# Patient Record
Sex: Male | Born: 1965 | Race: White | Hispanic: No | Marital: Married
Health system: Southern US, Community
[De-identification: ages and names within clinical notes are randomized; demographics above are authoritative.]

## PROBLEM LIST (undated history)

## (undated) DIAGNOSIS — J9621 Acute and chronic respiratory failure with hypoxia: Secondary | ICD-10-CM

## (undated) DIAGNOSIS — N170 Acute kidney failure with tubular necrosis: Secondary | ICD-10-CM

## (undated) DIAGNOSIS — U071 COVID-19: Secondary | ICD-10-CM

## (undated) DIAGNOSIS — J1282 Pneumonia due to coronavirus disease 2019: Secondary | ICD-10-CM

## (undated) DIAGNOSIS — J80 Acute respiratory distress syndrome: Secondary | ICD-10-CM

## (undated) HISTORY — DX: Acute and chronic respiratory failure with hypoxia: J96.21

## (undated) HISTORY — DX: Acute kidney failure with tubular necrosis: N17.0

## (undated) HISTORY — DX: COVID-19: U07.1

## (undated) HISTORY — DX: Pneumonia due to coronavirus disease 2019: J12.82

## (undated) HISTORY — DX: Acute respiratory distress syndrome: J80

---

## 2019-11-28 ENCOUNTER — Other Ambulatory Visit (HOSPITAL_COMMUNITY): Payer: BLUE CROSS/BLUE SHIELD

## 2019-11-28 ENCOUNTER — Inpatient Hospital Stay
Admission: AD | Admit: 2019-11-28 | Discharge: 2020-02-17 | Disposition: E | Payer: BLUE CROSS/BLUE SHIELD | Source: Other Acute Inpatient Hospital

## 2019-11-28 DIAGNOSIS — U071 COVID-19: Secondary | ICD-10-CM | POA: Diagnosis present

## 2019-11-28 DIAGNOSIS — Z4902 Encounter for fitting and adjustment of peritoneal dialysis catheter: Secondary | ICD-10-CM

## 2019-11-28 DIAGNOSIS — J9621 Acute and chronic respiratory failure with hypoxia: Secondary | ICD-10-CM | POA: Diagnosis present

## 2019-11-28 DIAGNOSIS — J189 Pneumonia, unspecified organism: Secondary | ICD-10-CM

## 2019-11-28 DIAGNOSIS — R0902 Hypoxemia: Secondary | ICD-10-CM

## 2019-11-28 DIAGNOSIS — J811 Chronic pulmonary edema: Secondary | ICD-10-CM

## 2019-11-28 DIAGNOSIS — R509 Fever, unspecified: Secondary | ICD-10-CM

## 2019-11-28 DIAGNOSIS — Z431 Encounter for attention to gastrostomy: Secondary | ICD-10-CM

## 2019-11-28 DIAGNOSIS — Z452 Encounter for adjustment and management of vascular access device: Secondary | ICD-10-CM

## 2019-11-28 DIAGNOSIS — N170 Acute kidney failure with tubular necrosis: Secondary | ICD-10-CM | POA: Diagnosis present

## 2019-11-28 DIAGNOSIS — J9690 Respiratory failure, unspecified, unspecified whether with hypoxia or hypercapnia: Secondary | ICD-10-CM

## 2019-11-28 HISTORY — DX: Acute respiratory distress syndrome: J80

## 2019-11-28 HISTORY — DX: COVID-19: U07.1

## 2019-11-28 HISTORY — DX: Pneumonia due to coronavirus disease 2019: J12.82

## 2019-11-28 HISTORY — DX: Acute and chronic respiratory failure with hypoxia: J96.21

## 2019-11-28 HISTORY — DX: Acute kidney failure with tubular necrosis: N17.0

## 2019-11-28 LAB — BLOOD GAS, ARTERIAL
Acid-Base Excess: 8.2 mmol/L — ABNORMAL HIGH (ref 0.0–2.0)
Acid-Base Excess: 8.3 mmol/L — ABNORMAL HIGH (ref 0.0–2.0)
Bicarbonate: 36.4 mmol/L — ABNORMAL HIGH (ref 20.0–28.0)
Bicarbonate: 36.6 mmol/L — ABNORMAL HIGH (ref 20.0–28.0)
FIO2: 55
FIO2: 55
O2 Saturation: 96.2 %
O2 Saturation: 97.6 %
Patient temperature: 36.5
Patient temperature: 38.1
pCO2 arterial: 98.2 mmHg (ref 32.0–48.0)
pCO2 arterial: 108 mmHg (ref 32.0–48.0)
pH, Arterial: 7.19 — CL (ref 7.350–7.450)
pH, Arterial: 7.164 — CL (ref 7.350–7.450)
pO2, Arterial: 78.2 mmHg — ABNORMAL LOW (ref 83.0–108.0)
pO2, Arterial: 100 mmHg (ref 83.0–108.0)

## 2019-11-28 MED ORDER — IOHEXOL 300 MG/ML  SOLN
50.0000 mL | Freq: Once | INTRAMUSCULAR | Status: AC | PRN
  Administered 2019-11-28: 50 mL

## 2019-11-29 ENCOUNTER — Other Ambulatory Visit (HOSPITAL_COMMUNITY): Payer: BLUE CROSS/BLUE SHIELD

## 2019-11-29 DIAGNOSIS — U071 COVID-19: Secondary | ICD-10-CM | POA: Diagnosis present

## 2019-11-29 DIAGNOSIS — J80 Acute respiratory distress syndrome: Secondary | ICD-10-CM | POA: Diagnosis not present

## 2019-11-29 DIAGNOSIS — J1282 Pneumonia due to coronavirus disease 2019: Secondary | ICD-10-CM | POA: Diagnosis present

## 2019-11-29 DIAGNOSIS — J9621 Acute and chronic respiratory failure with hypoxia: Secondary | ICD-10-CM | POA: Diagnosis not present

## 2019-11-29 DIAGNOSIS — N170 Acute kidney failure with tubular necrosis: Secondary | ICD-10-CM | POA: Diagnosis not present

## 2019-11-29 LAB — BLOOD GAS, ARTERIAL
Acid-Base Excess: 13 mmol/L — ABNORMAL HIGH (ref 0.0–2.0)
Acid-Base Excess: 13.5 mmol/L — ABNORMAL HIGH (ref 0.0–2.0)
Acid-Base Excess: 12.1 mmol/L — ABNORMAL HIGH (ref 0.0–2.0)
Acid-Base Excess: 11.1 mmol/L — ABNORMAL HIGH (ref 0.0–2.0)
Bicarbonate: 40.3 mmol/L — ABNORMAL HIGH (ref 20.0–28.0)
Bicarbonate: 40.7 mmol/L — ABNORMAL HIGH (ref 20.0–28.0)
Bicarbonate: 39.4 mmol/L — ABNORMAL HIGH (ref 20.0–28.0)
Bicarbonate: 39.5 mmol/L — ABNORMAL HIGH (ref 20.0–28.0)
FIO2: 50
FIO2: 50
FIO2: 50
FIO2: 50
O2 Saturation: 98.2 %
O2 Saturation: 96.7 %
O2 Saturation: 95.8 %
O2 Saturation: 96.1 %
Patient temperature: 37.8
Patient temperature: 37.2
Patient temperature: 37.2
Patient temperature: 36.3
pCO2 arterial: 97.2 mmHg (ref 32.0–48.0)
pCO2 arterial: 92.2 mmHg (ref 32.0–48.0)
pCO2 arterial: 91.3 mmHg (ref 32.0–48.0)
pCO2 arterial: 108 mmHg (ref 32.0–48.0)
pH, Arterial: 7.247 — ABNORMAL LOW (ref 7.350–7.450)
pH, Arterial: 7.268 — ABNORMAL LOW (ref 7.350–7.450)
pH, Arterial: 7.259 — ABNORMAL LOW (ref 7.350–7.450)
pH, Arterial: 7.185 — CL (ref 7.350–7.450)
pO2, Arterial: 101 mmHg (ref 83.0–108.0)
pO2, Arterial: 84 mmHg (ref 83.0–108.0)
pO2, Arterial: 77.4 mmHg — ABNORMAL LOW (ref 83.0–108.0)
pO2, Arterial: 77.7 mmHg — ABNORMAL LOW (ref 83.0–108.0)

## 2019-11-29 LAB — CBC WITH DIFFERENTIAL/PLATELET
Abs Immature Granulocytes: 1.18 10*3/uL — ABNORMAL HIGH (ref 0.00–0.07)
Basophils Absolute: 0.1 10*3/uL (ref 0.0–0.1)
Basophils Relative: 0 %
Eosinophils Absolute: 0 10*3/uL (ref 0.0–0.5)
Eosinophils Relative: 0 %
HCT: 32.1 % — ABNORMAL LOW (ref 39.0–52.0)
Hemoglobin: 9.5 g/dL — ABNORMAL LOW (ref 13.0–17.0)
Immature Granulocytes: 6 %
Lymphocytes Relative: 7 %
Lymphs Abs: 1.3 10*3/uL (ref 0.7–4.0)
MCH: 30.3 pg (ref 26.0–34.0)
MCHC: 29.6 g/dL — ABNORMAL LOW (ref 30.0–36.0)
MCV: 102.2 fL — ABNORMAL HIGH (ref 80.0–100.0)
Monocytes Absolute: 0.9 10*3/uL (ref 0.1–1.0)
Monocytes Relative: 5 %
Neutro Abs: 16.4 10*3/uL — ABNORMAL HIGH (ref 1.7–7.7)
Neutrophils Relative %: 82 %
Platelets: 203 10*3/uL (ref 150–400)
RBC: 3.14 MIL/uL — ABNORMAL LOW (ref 4.22–5.81)
RDW: 15.9 % — ABNORMAL HIGH (ref 11.5–15.5)
WBC: 19.9 10*3/uL — ABNORMAL HIGH (ref 4.0–10.5)
nRBC: 0.3 % — ABNORMAL HIGH (ref 0.0–0.2)

## 2019-11-29 LAB — COMPREHENSIVE METABOLIC PANEL
ALT: 25 U/L (ref 0–44)
AST: 34 U/L (ref 15–41)
Albumin: 2.1 g/dL — ABNORMAL LOW (ref 3.5–5.0)
Alkaline Phosphatase: 78 U/L (ref 38–126)
Anion gap: 8 (ref 5–15)
BUN: 123 mg/dL — ABNORMAL HIGH (ref 6–20)
CO2: 39 mmol/L — ABNORMAL HIGH (ref 22–32)
Calcium: 8.8 mg/dL — ABNORMAL LOW (ref 8.9–10.3)
Chloride: 106 mmol/L (ref 98–111)
Creatinine, Ser: 1.75 mg/dL — ABNORMAL HIGH (ref 0.61–1.24)
GFR calc Af Amer: 49 mL/min — ABNORMAL LOW (ref >60)
GFR calc non Af Amer: 43 mL/min — ABNORMAL LOW (ref >60)
Glucose, Bld: 161 mg/dL — ABNORMAL HIGH (ref 70–99)
Potassium: 5 mmol/L (ref 3.5–5.1)
Sodium: 153 mmol/L — ABNORMAL HIGH (ref 135–145)
Total Bilirubin: 0.4 mg/dL (ref 0.3–1.2)
Total Protein: 7 g/dL (ref 6.5–8.1)

## 2019-11-29 LAB — TRIGLYCERIDES: Triglycerides: 371 mg/dL — ABNORMAL HIGH (ref <150)

## 2019-11-29 LAB — PROTIME-INR
INR: 1.1 (ref 0.8–1.2)
Prothrombin Time: 14.1 seconds (ref 11.4–15.2)

## 2019-11-29 LAB — HEMOGLOBIN A1C
Hgb A1c MFr Bld: 5.7 % — ABNORMAL HIGH (ref 4.8–5.6)
Mean Plasma Glucose: 116.89 mg/dL

## 2019-11-29 NOTE — Consult Note (Signed)
Pulmonary Critical Care Medicine [facility] [place]  PULMONARY SERVICE  Date of Service: 11/29/2019  PULMONARY CRITICAL CARE CONSULT   Danny Leach  OIN:[id number]  DOB: June 12, 1965   DOA: 12/17/2019  Referring Physician: Carron Curie, MD  HPI: Danny Leach is a 54 y.o. male seen for follow up of Acute on Chronic Respiratory Failure.  Patient has multiple medical problems including hyperlipidemia COVID-19 history of smoking respiratory failure hypertension came into the hospital because of increasing shortness of breath fever fatigue diarrhea and was tested for COVID-19 turned out to be positive.  Patient followed up in the ED because of increased work of breathing.  Patient was given Decadron started on OptiFlow at that time.  CTA was done negative for pulmonary emboli however he had severe multifocal pneumonia.  The patient ended up intubated on the ventilator failed OptiFlow.  The patient had required ongoing sedation.  He also did develop renal failure subsequently improved.  Patient's family wanted ongoing aggressive measures tracheostomy and PEG tube was placed because of failure to wean from the ventilator.  The patient apparently had been on pressure support at the other facility reportedly according to the discharge summary.  On presentation to our facility he was significantly acidotic with a pH of 7.16 on arrival to our facility.  Review of Systems:  ROS performed and is unremarkable other than noted above.  Past Medical History:  Diagnosis Date  Unspecified essential hypertension  Hyperlipidemia ARDS COVID-19 COVID-19 pneumonia Respiratory failure Possible undiagnosed COPD  Past Surgical History:  Procedure Laterality Date  DENTAL SURGERY  TONSILLECTOMY Tracheostomy PEG tube  Family history: Noncontributory to the present illness  Allergies No known drug allergies  Social history: Positive for tobacco use No alcohol or drug  abuse   Medications: Reviewed on Rounds  Physical Exam:  Vitals: Temperature is 98.3 pulse 103 respiratory 28 blood pressure is 135/76 saturations 96%  Ventilator Settings changed the patient from assist control to on pressure assist control FiO2 50% tidal volume is 459 IP 18 PEEP 10 and we will also decrease the patient PEEP and increase the patient's respiratory rate and follow-up with an ABG  . General: Comfortable at this time . Eyes: Grossly normal lids, irises & conjunctiva . ENT: grossly tongue is normal . Neck: no obvious mass . Cardiovascular: S1-S2 normal no gallop or rub . Respiratory: Coarse breath sounds noted bilaterally . Abdomen: Soft and nontender . Skin: no rash seen on limited exam . Musculoskeletal: not rigid . Psychiatric:unable to assess . Neurologic: no seizure no involuntary movements         Labs on Admission:  Basic Metabolic Panel: Recent Labs  Lab 11/29/19 0558  NA 153*  K 5.0  CL 106  CO2 39*  GLUCOSE 161*  BUN 123*  CREATININE 1.75*  CALCIUM 8.8*    Recent Labs  Lab 11/21/2019 1732 12/09/2019 2150 11/29/19 0045 11/29/19 0825  PHART 7.190* 7.164* 7.247* 7.268*  PCO2ART 98.2* 108* 97.2* 92.2*  PO2ART 78.2* 100 101 84.0  HCO3 36.4* 36.6* 40.3* 40.7*  O2SAT 96.2 97.6 98.2 96.7    Liver Function Tests: Recent Labs  Lab 11/29/19 0558  AST 34  ALT 25  ALKPHOS 78  BILITOT 0.4  PROT 7.0  ALBUMIN 2.1*   No results for input(s): LIPASE, AMYLASE in the last 168 hours. No results for input(s): AMMONIA in the last 168 hours.  CBC: Recent Labs  Lab 11/29/19 0558  WBC 19.9*  NEUTROABS 16.4*  HGB 9.5*  HCT 32.1*  MCV 102.2*  PLT 203    Cardiac Enzymes: No results for input(s): CKTOTAL, CKMB, CKMBINDEX, TROPONINI in the last 168 hours.  BNP (last 3 results) No results for input(s): BNP in the last 8760 hours.  ProBNP (last 3 results) No results for input(s): PROBNP in the last 8760 hours.   Radiological Exams on  Admission: DG ABDOMEN PEG TUBE LOCATION  Result Date: 07-Dec-2019 CLINICAL DATA:  Peg placement. EXAM: ABDOMEN - 1 VIEW COMPARISON:  None. FINDINGS: Divided AP view of the abdomen obtained after the installation of 50 cc Omnipaque 300 through indwelling gastrostomy tube. Contrast opacifies the duodenum and jejunum. There is no evidence of extravasation or leak. Multiple overlying monitoring devices project over the patient. Normal bowel gas pattern without evidence of obstruction. Rounded calcification projects over the mid pelvis and right lower quadrant. IMPRESSION: Enteric contrast opacifies the duodenum and jejunum without evidence of extravasation or leak. Tube may represent a jejunostomy tube, recommend correlation with procedural/surgical history. Electronically Signed   By: Narda Rutherford M.D.   On: 2019/12/07 17:27   DG Chest Port 1 View  Result Date: 2019/12/07 CLINICAL DATA:  Pneumonia. EXAM: PORTABLE CHEST 1 VIEW COMPARISON:  None. FINDINGS: 1611 hours. Tracheostomy appears well position. The heart size and mediastinal contours are within normal limits for portable AP technique. There are diffuse bilateral airspace and interstitial pulmonary opacities, left greater than right. No large pleural effusion or pneumothorax. The bones appear unremarkable. IMPRESSION: 1. Diffuse bilateral airspace and interstitial pulmonary opacities, left greater than right, consistent with multilobar pneumonia and/or edema. Radiographic follow up recommended. 2. Tracheostomy appears well position. Electronically Signed   By: Carey Bullocks M.D.   On: December 07, 2019 16:39    Assessment/Plan Active Problems:   Acute on chronic respiratory failure with hypoxia (HCC)   COVID-19 virus infection   Acute respiratory distress syndrome (ARDS) due to COVID-19 virus (HCC)   Pneumonia due to COVID-19 virus   Acute renal failure due to tubular necrosis (HCC)   1. Acute on chronic respiratory failure with hypoxia patient's  not adequately ventilating at this time with a PCO2 being elevated.  Chest x-ray shows diffuse multilobar involvement as a residual COVID-19.  Patient a very well require sedation have spoken with the primary care team to go ahead and actually start sedation until we can get the ventilator settings stabilized.  There may also be a component of pulmonary edema so he may benefit from diuresis however he has worsening of renal function. 2. COVID-19 virus infection this is in resolution phase however patient is still with severe pulmonary damage. 3. ARDS secondary to COVID-19 patient has severe residual pulmonary damage secondary to the COVID-19 suspect this is going to be prolonged recovery and requiring prolonged mechanical ventilation. 4. Pneumonia with underlying COVID-19 has been treated with antibiotics plan is going to be continued supportive care 5. Acute renal failure reportedly improved with the current renal function actually shows at baseline 1.75 creatinine and BUN of 123 he may need to get nephrology involved.  I have personally seen and evaluated the patient, evaluated laboratory and imaging results, formulated the assessment and plan and placed orders. The Patient requires high complexity decision making with multiple systems involvement.  Case was discussed on Rounds with the Respiratory Therapy Director and the Respiratory staff Time Spent patient is critically ill in danger of cardiac arrest and death  Yevonne Pax, MD [facility] Pulmonary Critical Care Medicine Sleep Medicine

## 2019-11-30 ENCOUNTER — Institutional Professional Consult (permissible substitution) (HOSPITAL_COMMUNITY): Payer: BLUE CROSS/BLUE SHIELD

## 2019-11-30 DIAGNOSIS — J9621 Acute and chronic respiratory failure with hypoxia: Secondary | ICD-10-CM | POA: Diagnosis not present

## 2019-11-30 DIAGNOSIS — U071 COVID-19: Secondary | ICD-10-CM | POA: Diagnosis not present

## 2019-11-30 DIAGNOSIS — N170 Acute kidney failure with tubular necrosis: Secondary | ICD-10-CM | POA: Diagnosis not present

## 2019-11-30 DIAGNOSIS — J80 Acute respiratory distress syndrome: Secondary | ICD-10-CM | POA: Diagnosis not present

## 2019-11-30 LAB — BASIC METABOLIC PANEL
Anion gap: 7 (ref 5–15)
BUN: 136 mg/dL — ABNORMAL HIGH (ref 6–20)
CO2: 37 mmol/L — ABNORMAL HIGH (ref 22–32)
Calcium: 8.6 mg/dL — ABNORMAL LOW (ref 8.9–10.3)
Chloride: 106 mmol/L (ref 98–111)
Creatinine, Ser: 1.67 mg/dL — ABNORMAL HIGH (ref 0.61–1.24)
GFR calc Af Amer: 52 mL/min — ABNORMAL LOW (ref >60)
GFR calc non Af Amer: 45 mL/min — ABNORMAL LOW (ref >60)
Glucose, Bld: 186 mg/dL — ABNORMAL HIGH (ref 70–99)
Potassium: 4.8 mmol/L (ref 3.5–5.1)
Sodium: 150 mmol/L — ABNORMAL HIGH (ref 135–145)

## 2019-11-30 LAB — BLOOD GAS, ARTERIAL
Acid-Base Excess: 11 mmol/L — ABNORMAL HIGH (ref 0.0–2.0)
Bicarbonate: 37.8 mmol/L — ABNORMAL HIGH (ref 20.0–28.0)
FIO2: 50
O2 Saturation: 95.2 %
Patient temperature: 37
pCO2 arterial: 83.8 mmHg (ref 32.0–48.0)
pH, Arterial: 7.277 — ABNORMAL LOW (ref 7.350–7.450)
pO2, Arterial: 70.7 mmHg — ABNORMAL LOW (ref 83.0–108.0)

## 2019-11-30 LAB — CBC
HCT: 27.6 % — ABNORMAL LOW (ref 39.0–52.0)
Hemoglobin: 8 g/dL — ABNORMAL LOW (ref 13.0–17.0)
MCH: 29.3 pg (ref 26.0–34.0)
MCHC: 29 g/dL — ABNORMAL LOW (ref 30.0–36.0)
MCV: 101.1 fL — ABNORMAL HIGH (ref 80.0–100.0)
Platelets: 173 10*3/uL (ref 150–400)
RBC: 2.73 MIL/uL — ABNORMAL LOW (ref 4.22–5.81)
RDW: 16.1 % — ABNORMAL HIGH (ref 11.5–15.5)
WBC: 12.5 10*3/uL — ABNORMAL HIGH (ref 4.0–10.5)
nRBC: 0.5 % — ABNORMAL HIGH (ref 0.0–0.2)

## 2019-11-30 LAB — MAGNESIUM: Magnesium: 2.7 mg/dL — ABNORMAL HIGH (ref 1.7–2.4)

## 2019-11-30 NOTE — Progress Notes (Addendum)
Pulmonary Critical Care Medicine [facility] [place]   PULMONARY CRITICAL CARE SERVICE  PROGRESS NOTE  Date of Service: 11/30/2019  Danny Leach  PFX:[id number]  DOB: Jul 06, 1965   DOA: 2019-12-26  Referring Physician: Carron Curie, MD  HPI: Danny Leach is a 54 y.o. male seen for follow up of Acute on Chronic Respiratory Failure.  Patient remains on full support at this time pressure control mode rate of 35 and FiO2 of 60% satting well no fever or distress.  Patient continues to do poorly requiring increased respiratory rate as well as increased time oxygen.  He is now sedated also  Medications: Reviewed on Rounds  Physical Exam:  Vitals: Pulse 91 respirations 36 BP 103/58 O2 sat 97% temp 24  Ventilator Settings ventilator mode AC PC rate of 35 respiratory pressure 24 PEEP of 8 with FiO2 of 60%  . General: Comfortable at this time . Eyes: Grossly normal lids, irises & conjunctiva . ENT: grossly tongue is normal . Neck: no obvious mass . Cardiovascular: S1 S2 normal no gallop . Respiratory: No rales or rhonchi noted . Abdomen: soft . Skin: no rash seen on limited exam . Musculoskeletal: not rigid . Psychiatric:unable to assess . Neurologic: no seizure no involuntary movements         Lab Data:   Basic Metabolic Panel: Recent Labs  Lab 11/29/19 0558 11/30/19 0709  NA 153* 150*  K 5.0 4.8  CL 106 106  CO2 39* 37*  GLUCOSE 161* 186*  BUN 123* 136*  CREATININE 1.75* 1.67*  CALCIUM 8.8* 8.6*  MG  --  2.7*    ABG: Recent Labs  Lab 11/29/19 0825 11/29/19 1100 11/29/19 1628 11/29/19 2230 11/30/19 0555  PHART 7.268* 7.259* 7.185* 7.196* 7.277*  PCO2ART 92.2* 91.3* 108* 104* 83.8*  PO2ART 84.0 77.4* 77.7* 88.5 70.7*  HCO3 40.7* 39.4* 39.5* 38.6* 37.8*  O2SAT 96.7 95.8 96.1 97.4 95.2    Liver Function Tests: Recent Labs  Lab 11/29/19 0558  AST 34  ALT 25  ALKPHOS 78  BILITOT 0.4  PROT 7.0  ALBUMIN 2.1*   No results for input(s):  LIPASE, AMYLASE in the last 168 hours. No results for input(s): AMMONIA in the last 168 hours.  CBC: Recent Labs  Lab 11/29/19 0558 11/30/19 0709  WBC 19.9* 12.5*  NEUTROABS 16.4*  --   HGB 9.5* 8.0*  HCT 32.1* 27.6*  MCV 102.2* 101.1*  PLT 203 173    Cardiac Enzymes: No results for input(s): CKTOTAL, CKMB, CKMBINDEX, TROPONINI in the last 168 hours.  BNP (last 3 results) No results for input(s): BNP in the last 8760 hours.  ProBNP (last 3 results) No results for input(s): PROBNP in the last 8760 hours.  Radiological Exams: DG Chest Port 1 View  Result Date: 11/30/2019 CLINICAL DATA:  Pneumonia and pulmonary edema EXAM: PORTABLE CHEST 1 VIEW COMPARISON:  Yesterday FINDINGS: Tracheostomy tube and PICC in stable position. Cardiomegaly and vascular congestion. Asymmetric density on the left, cannot exclude superimposed airspace disease. No visible effusion or pneumothorax IMPRESSION: Stable cardiomegaly and vascular congestion. Continued asymmetric density greater on the left, possible pneumonia. Electronically Signed   By: Marnee Spring M.D.   On: 11/30/2019 06:25   DG CHEST PORT 1 VIEW  Result Date: 11/29/2019 CLINICAL DATA:  Central line placement.  Pneumonia. EXAM: PORTABLE CHEST 1 VIEW COMPARISON:  2019/12/26 FINDINGS: Right PICC line tip: Cavoatrial junction. Tracheostomy tube projects over the tracheal air column. The left-sided airspace opacities shown previously are mildly  improved. Bilateral considerable interstitial accentuation persists. Left heart border somewhat indistinct. IMPRESSION: 1. Right PICC line tip: Cavoatrial junction. 2. Mildly improved aeration in the left lung. 3. Continued bilateral interstitial accentuation suspicious for multilobar or atypical pneumonia. Electronically Signed   By: Gaylyn Rong M.D.   On: 11/29/2019 18:31    Assessment/Plan Active Problems:   Acute on chronic respiratory failure with hypoxia (HCC)   COVID-19 virus infection    Acute respiratory distress syndrome (ARDS) due to COVID-19 virus (HCC)   Pneumonia due to COVID-19 virus   Acute renal failure due to tubular necrosis (HCC)   1. Acute on chronic respiratory failure with hypoxia patient is currently unable to wean remains on pressure control mode with an FiO2 of 60% PEEP of 8 and a rate of 35.  We will hold weaning for now continue to attempt weaning as patient situation improves. 2. COVID-19 virus infection this is in resolution phase however patient is still with severe pulmonary damage. 3. ARDS secondary to COVID-19 patient has severe residual pulmonary damage secondary to the COVID-19 suspect this is going to be prolonged recovery and requiring prolonged mechanical ventilation. 4. Pneumonia with underlying COVID-19 has been treated with antibiotics plan is going to be continued supportive care 5. Acute renal failure reportedly improving continue to follow   I have personally seen and evaluated the patient, evaluated laboratory and imaging results, formulated the assessment and plan and placed orders. The Patient requires high complexity decision making with multiple systems involvement.  Rounds were done with the Respiratory Therapy Director and Staff therapists and discussed with nursing staff also.  Patient is critically ill in danger of cardiac arrest and death  Yevonne Pax, MD [facility] Pulmonary Critical Care Medicine Sleep Medicine

## 2019-12-01 DIAGNOSIS — J9621 Acute and chronic respiratory failure with hypoxia: Secondary | ICD-10-CM | POA: Diagnosis not present

## 2019-12-01 DIAGNOSIS — J80 Acute respiratory distress syndrome: Secondary | ICD-10-CM | POA: Diagnosis not present

## 2019-12-01 DIAGNOSIS — N170 Acute kidney failure with tubular necrosis: Secondary | ICD-10-CM | POA: Diagnosis not present

## 2019-12-01 DIAGNOSIS — U071 COVID-19: Secondary | ICD-10-CM | POA: Diagnosis not present

## 2019-12-01 LAB — CULTURE, RESPIRATORY W GRAM STAIN: Culture: NORMAL

## 2019-12-01 LAB — BASIC METABOLIC PANEL
Anion gap: 9 (ref 5–15)
BUN: 154 mg/dL — ABNORMAL HIGH (ref 6–20)
CO2: 36 mmol/L — ABNORMAL HIGH (ref 22–32)
Calcium: 9 mg/dL (ref 8.9–10.3)
Chloride: 103 mmol/L (ref 98–111)
Creatinine, Ser: 1.88 mg/dL — ABNORMAL HIGH (ref 0.61–1.24)
GFR calc Af Amer: 45 mL/min — ABNORMAL LOW (ref >60)
GFR calc non Af Amer: 39 mL/min — ABNORMAL LOW (ref >60)
Glucose, Bld: 177 mg/dL — ABNORMAL HIGH (ref 70–99)
Potassium: 5.2 mmol/L — ABNORMAL HIGH (ref 3.5–5.1)
Sodium: 148 mmol/L — ABNORMAL HIGH (ref 135–145)

## 2019-12-01 NOTE — Progress Notes (Signed)
Pulmonary Critical Care Medicine [facility] [place]   PULMONARY CRITICAL CARE SERVICE  PROGRESS NOTE  Date of Service: 12/01/2019  Danny Leach  WSF:[id number]  DOB: [birth year]   DOA: 11/24/2019  Referring Physician: Carron Curie, MD  HPI: Danny Leach is a 54 y.o. male seen for follow up of Acute on Chronic Respiratory Failure.  Patient remains critically ill on the ventilator.  His IP is at 24 and pressure control mode still requiring 60% FiO2.  Patient secretions are quite copious at this time.  Patient remains on sedation and is maximized on the ventilator rate at a rate of 35  Medications: Reviewed on Rounds  Physical Exam:  Vitals: Temperature 98.6 pulse 109 respiratory rate 35 blood pressure is 111/59 saturations 99%  Ventilator Settings on the ventilator and pressure assist control FiO2 60% PEEP of 8 tidal volume 507  . General: Comfortable at this time . Eyes: Grossly normal lids, irises & conjunctiva . ENT: grossly tongue is normal . Neck: no obvious mass . Cardiovascular: S1 S2 normal no gallop . Respiratory: Coarse breath sounds and rhonchi noted bilaterally. . Abdomen: soft . Skin: no rash seen on limited exam . Musculoskeletal: not rigid . Psychiatric:unable to assess . Neurologic: no seizure no involuntary movements         Lab Data:   Basic Metabolic Panel: Recent Labs  Lab 11/29/19 0558 11/30/19 0709 12/01/19 0359  NA 153* 150* 148*  K 5.0 4.8 5.2*  CL 106 106 103  CO2 39* 37* 36*  GLUCOSE 161* 186* 177*  BUN 123* 136* 154*  CREATININE 1.75* 1.67* 1.88*  CALCIUM 8.8* 8.6* 9.0  MG  --  2.7*  --     ABG: Recent Labs  Lab 11/29/19 0825 11/29/19 1100 11/29/19 1628 11/29/19 2230 11/30/19 0555  PHART 7.268* 7.259* 7.185* 7.196* 7.277*  PCO2ART 92.2* 91.3* 108* 104* 83.8*  PO2ART 84.0 77.4* 77.7* 88.5 70.7*  HCO3 40.7* 39.4* 39.5* 38.6* 37.8*  O2SAT 96.7 95.8 96.1 97.4 95.2    Liver Function Tests: Recent Labs  Lab  11/29/19 0558  AST 34  ALT 25  ALKPHOS 78  BILITOT 0.4  PROT 7.0  ALBUMIN 2.1*   No results for input(s): LIPASE, AMYLASE in the last 168 hours. No results for input(s): AMMONIA in the last 168 hours.  CBC: Recent Labs  Lab 11/29/19 0558 11/30/19 0709  WBC 19.9* 12.5*  NEUTROABS 16.4*  --   HGB 9.5* 8.0*  HCT 32.1* 27.6*  MCV 102.2* 101.1*  PLT 203 173    Cardiac Enzymes: No results for input(s): CKTOTAL, CKMB, CKMBINDEX, TROPONINI in the last 168 hours.  BNP (last 3 results) No results for input(s): BNP in the last 8760 hours.  ProBNP (last 3 results) No results for input(s): PROBNP in the last 8760 hours.  Radiological Exams: DG Chest Port 1 View  Result Date: 11/30/2019 CLINICAL DATA:  Pneumonia and pulmonary edema EXAM: PORTABLE CHEST 1 VIEW COMPARISON:  Yesterday FINDINGS: Tracheostomy tube and PICC in stable position. Cardiomegaly and vascular congestion. Asymmetric density on the left, cannot exclude superimposed airspace disease. No visible effusion or pneumothorax IMPRESSION: Stable cardiomegaly and vascular congestion. Continued asymmetric density greater on the left, possible pneumonia. Electronically Signed   By: Marnee Spring M.D.   On: 11/30/2019 06:25    Assessment/Plan Active Problems:   Acute on chronic respiratory failure with hypoxia (HCC)   COVID-19 virus infection   Acute respiratory distress syndrome (ARDS) due to COVID-19 virus (HCC)  Pneumonia due to COVID-19 virus   Acute renal failure due to tubular necrosis (HCC)   1. Acute on chronic respiratory failure with hypoxia patient still remains significantly acidotic based on the last ABG.  Would recommend full vent support follow-up ABG. 2. COVID-19 virus infection has been in resolution phase however patient has severe pulmonary damage related to the COVID-19 pulmonary infection.  I feel that based on the findings of his radiological studies it is going to be quite difficult to wean him off  the ventilator and there is a chance that he may actually not survive this admission.  Right now he is on full vent support 3. ARDS no improvement clinically.  The patient shows significant increased densities on the chest films.  This ARDS is underlying COVID-19 infection. 4. Pneumonia due to COVID-19 this has been treated patient received recommended therapies at the referring facility.  He has not shown significant improvement however 5. Acute renal failure with tubular necrosis we are following the labs the most recent BUN creatinine 154 and 1.88.  We may need to have nephrology comment on the patient's renal status.   I have personally seen and evaluated the patient, evaluated laboratory and imaging results, formulated the assessment and plan and placed orders. The Patient requires high complexity decision making with multiple systems involvement.  Rounds were done with the Respiratory Therapy Director and Staff therapists and discussed with nursing staff also.  Time 35 minutes patient is critically ill in danger of cardiac arrest and death  Yevonne Pax, MD [facility] Pulmonary Critical Care Medicine Sleep Medicine

## 2019-12-01 NOTE — Progress Notes (Signed)
   Patient Status: [facility] - In-pt  Assessment and Plan: Patient in need of venous access.  Patient with elevated SCr from baseline, now 1.88.  Remains on trach/vent with pulmonary compromise.  Request made for temporary dialysis catheter placement.   Risks and benefits discussed with the patient including, but not limited to bleeding, infection, vascular injury, pneumothorax which may require chest tube placement, air embolism or even death  All of the patient's questions were answered, patient is agreeable to proceed. Consent signed and in chart.  ______________________________________________________________________   History of Present Illness: Danny Leach is a 54 y.o. male with past medical history of COVID-19 respiratory infection and subsequent pulmonary damage requiring vent/trach.  Continues with acute-on-chronic renal failure.  Request made for temporary dialysis catheter placement.   Allergies and medications reviewed.   Review of Systems: A 12 point ROS discussed and pertinent positives are indicated in the HPI above.  All other systems are negative.  Review of Systems  Unable to perform ROS: Acuity of condition    Vital Signs: There were no vitals taken for this visit.  Physical Exam Vitals and nursing note reviewed.  Constitutional:      Appearance: Normal appearance.  HENT:     Mouth/Throat:     Mouth: Mucous membranes are moist.     Pharynx: Oropharynx is clear.  Cardiovascular:     Rate and Rhythm: Normal rate.  Pulmonary:     Effort: No respiratory distress.     Comments: Vent/trach Skin:    General: Skin is warm and dry.  Neurological:     General: No focal deficit present.     Mental Status: He is alert and oriented to person, place, and time. Mental status is at baseline.      Imaging reviewed.   Labs:  COAGS: Recent Labs    11/29/19 0558  INR 1.1    BMP: Recent Labs    11/29/19 0558 11/30/19 0709 12/01/19 0359  NA 153*  150* 148*  K 5.0 4.8 5.2*  CL 106 106 103  CO2 39* 37* 36*  GLUCOSE 161* 186* 177*  BUN 123* 136* 154*  CALCIUM 8.8* 8.6* 9.0  CREATININE 1.75* 1.67* 1.88*  GFRNONAA 43* 45* 39*  GFRAA 49* 52* 45*       Electronically Signed: Hoyt Koch, PA 12/01/2019, 4:28 PM   I spent a total of 15 minutes in face to face in clinical consultation, greater than 50% of which was counseling/coordinating care for venous access.

## 2019-12-02 DIAGNOSIS — J9621 Acute and chronic respiratory failure with hypoxia: Secondary | ICD-10-CM | POA: Diagnosis not present

## 2019-12-02 DIAGNOSIS — N170 Acute kidney failure with tubular necrosis: Secondary | ICD-10-CM | POA: Diagnosis not present

## 2019-12-02 DIAGNOSIS — J80 Acute respiratory distress syndrome: Secondary | ICD-10-CM | POA: Diagnosis not present

## 2019-12-02 DIAGNOSIS — U071 COVID-19: Secondary | ICD-10-CM | POA: Diagnosis not present

## 2019-12-02 LAB — BLOOD GAS, ARTERIAL
Acid-Base Excess: 11.3 mmol/L — ABNORMAL HIGH (ref 0.0–2.0)
Bicarbonate: 36.7 mmol/L — ABNORMAL HIGH (ref 20.0–28.0)
FIO2: 50
O2 Saturation: 97.8 %
Patient temperature: 37
pCO2 arterial: 62.7 mmHg — ABNORMAL HIGH (ref 32.0–48.0)
pH, Arterial: 7.386 (ref 7.350–7.450)
pO2, Arterial: 87.4 mmHg (ref 83.0–108.0)

## 2019-12-02 LAB — BASIC METABOLIC PANEL
Anion gap: 9 (ref 5–15)
BUN: 143 mg/dL — ABNORMAL HIGH (ref 6–20)
CO2: 34 mmol/L — ABNORMAL HIGH (ref 22–32)
Calcium: 8.5 mg/dL — ABNORMAL LOW (ref 8.9–10.3)
Chloride: 104 mmol/L (ref 98–111)
Creatinine, Ser: 1.68 mg/dL — ABNORMAL HIGH (ref 0.61–1.24)
GFR calc Af Amer: 52 mL/min — ABNORMAL LOW (ref >60)
GFR calc non Af Amer: 45 mL/min — ABNORMAL LOW (ref >60)
Glucose, Bld: 170 mg/dL — ABNORMAL HIGH (ref 70–99)
Potassium: 4.1 mmol/L (ref 3.5–5.1)
Sodium: 147 mmol/L — ABNORMAL HIGH (ref 135–145)

## 2019-12-02 LAB — TRIGLYCERIDES: Triglycerides: 134 mg/dL (ref <150)

## 2019-12-02 NOTE — Progress Notes (Addendum)
Pulmonary Critical Care Medicine [facility] [place]   PULMONARY CRITICAL CARE SERVICE  PROGRESS NOTE  Date of Service: 12/02/2019  Danny Leach  OZH:[id number]  DOB: 1965-08-25   DOA: 12/08/2019  Referring Physician: Carron Curie, MD  HPI: Danny Leach is a 53 y.o. male seen for follow up of Acute on Chronic Respiratory Failure.  Patient remains on full support on ventilator at this time currently rate of 34 with an FiO2 of 50% is unable to wean today currently satting well.  Medications: Reviewed on Rounds  Physical Exam:  Vitals: Pulse 101 respirations 20 BP 134/77 O2 sat 90% temp 98.3  Ventilator Settings ventilator mode AC PC rate of 34 respiratory 24 PEEP of 8 FiO2 50%  . General: Comfortable at this time . Eyes: Grossly normal lids, irises & conjunctiva . ENT: grossly tongue is normal . Neck: no obvious mass . Cardiovascular: S1 S2 normal no gallop . Respiratory: No rales or rhonchi noted . Abdomen: soft . Skin: no rash seen on limited exam . Musculoskeletal: not rigid . Psychiatric:unable to assess . Neurologic: no seizure no involuntary movements         Lab Data:   Basic Metabolic Panel: Recent Labs  Lab 11/29/19 0558 11/30/19 0709 12/01/19 0359 12/02/19 0948  NA 153* 150* 148* 147*  K 5.0 4.8 5.2* 4.1  CL 106 106 103 104  CO2 39* 37* 36* 34*  GLUCOSE 161* 186* 177* 170*  BUN 123* 136* 154* 143*  CREATININE 1.75* 1.67* 1.88* 1.68*  CALCIUM 8.8* 8.6* 9.0 8.5*  MG  --  2.7*  --   --     ABG: Recent Labs  Lab 11/29/19 1100 11/29/19 1628 11/29/19 2230 11/30/19 0555 12/02/19 0544  PHART 7.259* 7.185* 7.196* 7.277* 7.386  PCO2ART 91.3* 108* 104* 83.8* 62.7*  PO2ART 77.4* 77.7* 88.5 70.7* 87.4  HCO3 39.4* 39.5* 38.6* 37.8* 36.7*  O2SAT 95.8 96.1 97.4 95.2 97.8    Liver Function Tests: Recent Labs  Lab 11/29/19 0558  AST 34  ALT 25  ALKPHOS 78  BILITOT 0.4  PROT 7.0  ALBUMIN 2.1*   No results for input(s): LIPASE,  AMYLASE in the last 168 hours. No results for input(s): AMMONIA in the last 168 hours.  CBC: Recent Labs  Lab 11/29/19 0558 11/30/19 0709  WBC 19.9* 12.5*  NEUTROABS 16.4*  --   HGB 9.5* 8.0*  HCT 32.1* 27.6*  MCV 102.2* 101.1*  PLT 203 173    Cardiac Enzymes: No results for input(s): CKTOTAL, CKMB, CKMBINDEX, TROPONINI in the last 168 hours.  BNP (last 3 results) No results for input(s): BNP in the last 8760 hours.  ProBNP (last 3 results) No results for input(s): PROBNP in the last 8760 hours.  Radiological Exams: No results found.  Assessment/Plan Active Problems:   Acute on chronic respiratory failure with hypoxia (HCC)   COVID-19 virus infection   Acute respiratory distress syndrome (ARDS) due to COVID-19 virus (HCC)   Pneumonia due to COVID-19 virus   Acute renal failure due to tubular necrosis (HCC)   1. Acute on chronic respiratory failure with hypoxia patient will continue on full support at this time we will continue treatment weaning as tolerated aggressive pulmonary toilet supportive measures. 2. COVID-19 virus infection has been in resolution phase however patient has severe pulmonary damage related to the COVID-19 pulmonary infection.  I feel that based on the findings of his radiological studies it is going to be quite difficult to wean him off  the ventilator and there is a chance that he may actually not survive this admission.  Right now he is on full vent support 3. ARDS no improvement clinically.  The patient shows significant increased densities on the chest films.  This ARDS is underlying COVID-19 infection. 4. Pneumonia due to COVID-19 this has been treated patient received recommended therapies at the referring facility.  He has not shown significant improvement however 5. Acute renal failure with tubular necrosis we are following the labs the most recent BUN creatinine 154 and 1.88.  We may need to have nephrology comment on the patient's renal  status.   I have personally seen and evaluated the patient, evaluated laboratory and imaging results, formulated the assessment and plan and placed orders. The Patient requires high complexity decision making with multiple systems involvement.  Rounds were done with the Respiratory Therapy Director and Staff therapists and discussed with nursing staff also.  Yevonne Pax, MD [facility] Pulmonary Critical Care Medicine Sleep Medicine

## 2019-12-02 NOTE — Consult Note (Addendum)
[facility] CONSULT NOTE    Date: 12/02/2019                  Patient Name:  Danny Leach  MRN: [id number]  DOB: Jul 17, 1965  Age / Sex: 54 y.o., male         PCP: Shayne Alken, MD                 Service Requesting Consult:  Hospitalist                 Reason for Consult:  Acute kidney injury/azotemia in the setting of acute respiratory failure            History of Present Illness: Patient is a 54 y.o. male with a PMHx of recent COVID-19 infection with resultant respiratory failure status post tracheostomy placement, hypertension, hyperlipidemia, tobacco abuse, ARDS, PEG tube placement who was admitted to [facility] on 2019-12-16 for ongoing treatment of acute respiratory failure.  Patient still maintained on ventilatory support.  He is currently sedated and unable to offer any history.  We are asked to see him for evaluation management of acute kidney injury.  Upon arrival here his BUN was 123 with a creatinine of 1.75.  Steadily BUN has risen up to 154 with a creatinine of 1.8.  However he is making excellent urine and made 2.7 L yesterday.  He also has hyponatremia with a serum sodium of 148 and potassium is a bit high at 5.2.  He is receiving tube feeds with Nepro and receiving free water flush of 50 cc every 2 hours.  Case has been discussed with hospitalist as well as dietitian.   Medications: Outpatient medications: No medications prior to admission.    Current medications: D5 0.2 NS drip, folic acid 2 mg IV daily, levofloxacin 750 mg IV daily, Humalog sliding scale, amantadine 100 mg daily, Lipitor 40 mg daily, fish oil 8 g daily, hydroxyurea 500 mg twice daily, melatonin 3 mg nightly, Solu-Medrol 60 mg IV twice daily, midazolam drip, modafinil 100 mg daily, Bactrim 0.5 mg Monday, Wednesday, Friday, multivitamin   Allergies: No known drug allergies   Past Medical History: Past Medical History:  Diagnosis Date  . Acute on  chronic respiratory failure with hypoxia (HCC)   . Acute renal failure due to tubular necrosis (HCC)   . Acute respiratory distress syndrome (ARDS) due to COVID-19 virus (HCC)   . COVID-19 virus infection   . Pneumonia due to COVID-19 virus      Past Surgical History: Tracheostomy placement PEG tube placement Dental surgery  Family History: Unable to obtain from the patient as he is currently on the ventilator.  Social History: Unable to obtain from the patient as he is currently on the ventilator.  Review of Systems: Unable to obtain from the patient as he is currently on the ventilator.  Vital Signs: Temperature 90.3 pulse 101 respirations 30 blood pressure 134/77  Weight trends: There were no vitals filed for this visit.  Physical Exam: General: Critically ill-appearing  Head: Normocephalic, atraumatic.  Eyes: Anicteric  Nose: Mucous membranes moist, not inflammed, nonerythematous.  Throat: Unable to visualize  Neck: Tracheostomy in place  Lungs:  Scattered rhonchi bilateral, vent assisted  Heart: S1S2 no rubs  Abdomen:  Soft, NTND, BS present, PEG present  Extremities: No pretibial edema.  Neurologic: On sedation, not following commands  Skin: No visible rashes, scars.    Lab results: Basic Metabolic Panel: Recent Labs  Lab  11/29/19 0558 11/30/19 0709 12/01/19 0359  NA 153* 150* 148*  K 5.0 4.8 5.2*  CL 106 106 103  CO2 39* 37* 36*  GLUCOSE 161* 186* 177*  BUN 123* 136* 154*  CREATININE 1.75* 1.67* 1.88*  CALCIUM 8.8* 8.6* 9.0  MG  --  2.7*  --     Liver Function Tests: Recent Labs  Lab 11/29/19 0558  AST 34  ALT 25  ALKPHOS 78  BILITOT 0.4  PROT 7.0  ALBUMIN 2.1*   No results for input(s): LIPASE, AMYLASE in the last 168 hours. No results for input(s): AMMONIA in the last 168 hours.  CBC: Recent Labs  Lab 11/29/19 0558 11/30/19 0709  WBC 19.9* 12.5*  NEUTROABS 16.4*  --   HGB 9.5* 8.0*  HCT 32.1* 27.6*  MCV 102.2* 101.1*  PLT  203 173    Cardiac Enzymes: No results for input(s): CKTOTAL, CKMB, CKMBINDEX, TROPONINI in the last 168 hours.  BNP: Invalid input(s): POCBNP  CBG: No results for input(s): GLUCAP in the last 168 hours.  Microbiology: Results for orders placed or performed during the hospital encounter of 11/22/2019  Culture, respiratory     Status: None   Collection Time: 11/29/19  9:21 AM   Specimen: Tracheal Aspirate; Respiratory  Result Value Ref Range Status   Specimen Description TRACHEAL ASPIRATE  Final   Special Requests NONE  Final   Gram Stain   Final    FEW WBC PRESENT, PREDOMINANTLY PMN ABUNDANT GRAM POSITIVE COCCI ABUNDANT GRAM POSITIVE RODS MODERATE GRAM NEGATIVE RODS    Culture   Final    RARE Consistent with normal respiratory flora. Performed at [facility], [address], [place], [place] [address]    Report Status 12/01/2019 FINAL  Final    Coagulation Studies: No results for input(s): LABPROT, INR in the last 72 hours.  Urinalysis: No results for input(s): COLORURINE, LABSPEC, PHURINE, GLUCOSEU, HGBUR, BILIRUBINUR, KETONESUR, PROTEINUR, UROBILINOGEN, NITRITE, LEUKOCYTESUR in the last 72 hours.  Invalid input(s): APPERANCEUR    Imaging:  No results found.   Assessment & Plan: Pt is a 54 y.o. male with a PMHx of recent COVID-19 infection with resultant respiratory failure status post tracheostomy placement, hypertension, hyperlipidemia, tobacco abuse, ARDS, PEG tube placement who was admitted to [facility] on 12/07/2019 for ongoing treatment of acute respiratory failure.  1.  Acute kidney injury suspect most likely underlying ATN.  Patient has azotemia out of proportion to his underlying creatinine rise.  Suspect that this is multifactorial but concentrated tube feeds may be playing a role.  I have discussed the case with the dietitian.  We will discontinue Nepro for now and transition the patient to Osmolite.  We will also increase free water flush to  50 cc every hour as opposed to 50 cc to 2 hours that he is getting.  Patient making good urine with urine output of 2.7 L.  Patient also receiving steroids which is contributing to his azotemia.  2.  Hypernatremia.  DC current IV fluids and transition patient to D5W at 100 cc/h.  Also increasing free water flush to 50 cc every hour.  3.  Acute respiratory failure secondary to COVID-19 sequela.  Continue current ventilatory support at this time.  Patient maintained on steroids.  We will see if this can be reduced in dose over time.  Steroids contributing to his underlying azotemia.

## 2019-12-03 DIAGNOSIS — J9621 Acute and chronic respiratory failure with hypoxia: Secondary | ICD-10-CM | POA: Diagnosis not present

## 2019-12-03 DIAGNOSIS — U071 COVID-19: Secondary | ICD-10-CM | POA: Diagnosis not present

## 2019-12-03 DIAGNOSIS — J80 Acute respiratory distress syndrome: Secondary | ICD-10-CM | POA: Diagnosis not present

## 2019-12-03 DIAGNOSIS — N170 Acute kidney failure with tubular necrosis: Secondary | ICD-10-CM | POA: Diagnosis not present

## 2019-12-03 LAB — BLOOD GAS, ARTERIAL
Acid-Base Excess: 10.5 mmol/L — ABNORMAL HIGH (ref 0.0–2.0)
Bicarbonate: 38.6 mmol/L — ABNORMAL HIGH (ref 20.0–28.0)
FIO2: 50
O2 Saturation: 97.4 %
Patient temperature: 37
pCO2 arterial: 104 mmHg (ref 32.0–48.0)
pH, Arterial: 7.196 — CL (ref 7.350–7.450)
pO2, Arterial: 88.5 mmHg (ref 83.0–108.0)

## 2019-12-03 NOTE — Progress Notes (Signed)
Pulmonary Critical Care Medicine [facility] [place]   PULMONARY CRITICAL CARE SERVICE  PROGRESS NOTE  Date of Service: 12/03/2019  Danny Leach  OIZ:[id number]  DOB: [birth year]   DOA: 12/04/2019  Referring Physician: Carron Curie, MD  HPI: Danny Leach is a 54 y.o. male seen for follow up of Acute on Chronic Respiratory Failure.  Patient currently is on the ventilator on full support has been on pressure control mode.  The sedation has been discontinued so on patient is off of all sedative medications however still has some significantly tachypneic.  Recheck the RSB I it is poor not ready for weaning  Medications: Reviewed on Rounds  Physical Exam:  Vitals: Temperature 98.1 pulse 98 respiratory rate 30 blood pressure is 130/70 saturations 95%  Ventilator Settings on pressure assist control FiO2 is 50% tidal volume 11/28/2022 PEEP  . General: Comfortable at this time . Eyes: Grossly normal lids, irises & conjunctiva . ENT: grossly tongue is normal . Neck: no obvious mass . Cardiovascular: S1 S2 normal no gallop . Respiratory: No rhonchi no rales are noted at this time . Abdomen: soft . Skin: no rash seen on limited exam . Musculoskeletal: not rigid . Psychiatric:unable to assess . Neurologic: no seizure no involuntary movements         Lab Data:   Basic Metabolic Panel: Recent Labs  Lab 11/29/19 0558 11/30/19 0709 12/01/19 0359 12/02/19 0948  NA 153* 150* 148* 147*  K 5.0 4.8 5.2* 4.1  CL 106 106 103 104  CO2 39* 37* 36* 34*  GLUCOSE 161* 186* 177* 170*  BUN 123* 136* 154* 143*  CREATININE 1.75* 1.67* 1.88* 1.68*  CALCIUM 8.8* 8.6* 9.0 8.5*  MG  --  2.7*  --   --     ABG: Recent Labs  Lab 11/29/19 1100 11/29/19 1628 11/29/19 2230 11/30/19 0555 12/02/19 0544  PHART 7.259* 7.185* 7.196* 7.277* 7.386  PCO2ART 91.3* 108* 104* 83.8* 62.7*  PO2ART 77.4* 77.7* 88.5 70.7* 87.4  HCO3 39.4* 39.5* 38.6* 37.8* 36.7*  O2SAT 95.8 96.1 97.4 95.2  97.8    Liver Function Tests: Recent Labs  Lab 11/29/19 0558  AST 34  ALT 25  ALKPHOS 78  BILITOT 0.4  PROT 7.0  ALBUMIN 2.1*   No results for input(s): LIPASE, AMYLASE in the last 168 hours. No results for input(s): AMMONIA in the last 168 hours.  CBC: Recent Labs  Lab 11/29/19 0558 11/30/19 0709  WBC 19.9* 12.5*  NEUTROABS 16.4*  --   HGB 9.5* 8.0*  HCT 32.1* 27.6*  MCV 102.2* 101.1*  PLT 203 173    Cardiac Enzymes: No results for input(s): CKTOTAL, CKMB, CKMBINDEX, TROPONINI in the last 168 hours.  BNP (last 3 results) No results for input(s): BNP in the last 8760 hours.  ProBNP (last 3 results) No results for input(s): PROBNP in the last 8760 hours.  Radiological Exams: No results found.  Assessment/Plan Active Problems:   Acute on chronic respiratory failure with hypoxia (HCC)   COVID-19 virus infection   Acute respiratory distress syndrome (ARDS) due to COVID-19 virus (HCC)   Pneumonia due to COVID-19 virus   Acute renal failure due to tubular necrosis (HCC)   1. Acute on chronic respiratory failure hypoxia patient was showing improvement in the blood gases however still significantly tachypneic.  Patient also has had the sedation discontinued.  Hopefully will show ongoing improvement and then we can try reducing weaning on the ventilator. 2. COVID-19 virus infection in resolution  phase 3. ARDS treated per notes at the other facility we will continue with low volume strategy if possible right now his volumes are too high likely representing an improvement in the patient's compliance 4. Pneumonia due to COVID-19 treated we will continue with supportive care 5. Acute renal failure seen by nephrology continue with recommendations   I have personally seen and evaluated the patient, evaluated laboratory and imaging results, formulated the assessment and plan and placed orders. The Patient requires high complexity decision making with multiple systems  involvement.  Rounds were done with the Respiratory Therapy Director and Staff therapists and discussed with nursing staff also.  Yevonne Pax, MD [facility] Pulmonary Critical Care Medicine Sleep Medicine

## 2019-12-04 ENCOUNTER — Other Ambulatory Visit (HOSPITAL_COMMUNITY): Payer: BLUE CROSS/BLUE SHIELD

## 2019-12-04 DIAGNOSIS — U071 COVID-19: Secondary | ICD-10-CM | POA: Diagnosis not present

## 2019-12-04 DIAGNOSIS — N170 Acute kidney failure with tubular necrosis: Secondary | ICD-10-CM | POA: Diagnosis not present

## 2019-12-04 DIAGNOSIS — J80 Acute respiratory distress syndrome: Secondary | ICD-10-CM | POA: Diagnosis not present

## 2019-12-04 DIAGNOSIS — J9621 Acute and chronic respiratory failure with hypoxia: Secondary | ICD-10-CM | POA: Diagnosis not present

## 2019-12-04 LAB — BASIC METABOLIC PANEL
Anion gap: 7 (ref 5–15)
BUN: 84 mg/dL — ABNORMAL HIGH (ref 6–20)
CO2: 32 mmol/L (ref 22–32)
Calcium: 7.9 mg/dL — ABNORMAL LOW (ref 8.9–10.3)
Chloride: 101 mmol/L (ref 98–111)
Creatinine, Ser: 1.31 mg/dL — ABNORMAL HIGH (ref 0.61–1.24)
GFR calc Af Amer: >60 mL/min (ref >60)
GFR calc non Af Amer: >60 mL/min (ref >60)
Glucose, Bld: 181 mg/dL — ABNORMAL HIGH (ref 70–99)
Potassium: 4.9 mmol/L (ref 3.5–5.1)
Sodium: 140 mmol/L (ref 135–145)

## 2019-12-04 LAB — CBC
HCT: 26.3 % — ABNORMAL LOW (ref 39.0–52.0)
Hemoglobin: 8.2 g/dL — ABNORMAL LOW (ref 13.0–17.0)
MCH: 29.8 pg (ref 26.0–34.0)
MCHC: 31.2 g/dL (ref 30.0–36.0)
MCV: 95.6 fL (ref 80.0–100.0)
Platelets: 112 10*3/uL — ABNORMAL LOW (ref 150–400)
RBC: 2.75 MIL/uL — ABNORMAL LOW (ref 4.22–5.81)
RDW: 18.2 % — ABNORMAL HIGH (ref 11.5–15.5)
WBC: 13.3 10*3/uL — ABNORMAL HIGH (ref 4.0–10.5)
nRBC: 0.2 % (ref 0.0–0.2)

## 2019-12-04 NOTE — Progress Notes (Addendum)
Pulmonary Critical Care Medicine [facility] [place]   PULMONARY CRITICAL CARE SERVICE  PROGRESS NOTE  Date of Service: 12/04/2019  Danny Leach  MOQ:[id number]  DOB: [birth year]   DOA: 26-Dec-2019  Referring Physician: Carron Curie, MD  HPI: Danny Leach is a 54 y.o. male seen for follow up of Acute on Chronic Respiratory Failure.  Patient continues on full support at this time pressure control mode rate of 35 and FiO2 of 45% has a low-grade fever today sputum culture has been sent.  Medications: Reviewed on Rounds  Physical Exam:  Vitals: Pulse 111 respirations 40 BP 141/79 O2 sat 99% temp 99.6  Ventilator Settings ventilator mode AC PC rate of 35 respiratory pressure 24 PEEP of 8 with an FiO2 of 40%  . General: Comfortable at this time . Eyes: Grossly normal lids, irises & conjunctiva . ENT: grossly tongue is normal . Neck: no obvious mass . Cardiovascular: S1 S2 normal no gallop . Respiratory: No rales or rhonchi noted . Abdomen: soft . Skin: no rash seen on limited exam . Musculoskeletal: not rigid . Psychiatric:unable to assess . Neurologic: no seizure no involuntary movements         Lab Data:   Basic Metabolic Panel: Recent Labs  Lab 11/29/19 0558 11/30/19 0709 12/01/19 0359 12/02/19 0948 12/04/19 1426  NA 153* 150* 148* 147* 140  K 5.0 4.8 5.2* 4.1 4.9  CL 106 106 103 104 101  CO2 39* 37* 36* 34* 32  GLUCOSE 161* 186* 177* 170* 181*  BUN 123* 136* 154* 143* 84*  CREATININE 1.75* 1.67* 1.88* 1.68* 1.31*  CALCIUM 8.8* 8.6* 9.0 8.5* 7.9*  MG  --  2.7*  --   --   --     ABG: Recent Labs  Lab 11/29/19 1100 11/29/19 1628 11/29/19 2230 11/30/19 0555 12/02/19 0544  PHART 7.259* 7.185* 7.196* 7.277* 7.386  PCO2ART 91.3* 108* 104* 83.8* 62.7*  PO2ART 77.4* 77.7* 88.5 70.7* 87.4  HCO3 39.4* 39.5* 38.6* 37.8* 36.7*  O2SAT 95.8 96.1 97.4 95.2 97.8    Liver Function Tests: Recent Labs  Lab 11/29/19 0558  AST 34  ALT 25  ALKPHOS  78  BILITOT 0.4  PROT 7.0  ALBUMIN 2.1*   No results for input(s): LIPASE, AMYLASE in the last 168 hours. No results for input(s): AMMONIA in the last 168 hours.  CBC: Recent Labs  Lab 11/29/19 0558 11/30/19 0709 12/04/19 1426  WBC 19.9* 12.5* 13.3*  NEUTROABS 16.4*  --   --   HGB 9.5* 8.0* 8.2*  HCT 32.1* 27.6* 26.3*  MCV 102.2* 101.1* 95.6  PLT 203 173 112*    Cardiac Enzymes: No results for input(s): CKTOTAL, CKMB, CKMBINDEX, TROPONINI in the last 168 hours.  BNP (last 3 results) No results for input(s): BNP in the last 8760 hours.  ProBNP (last 3 results) No results for input(s): PROBNP in the last 8760 hours.  Radiological Exams: DG CHEST PORT 1 VIEW  Result Date: 12/04/2019 CLINICAL DATA:  Fever. EXAM: PORTABLE CHEST 1 VIEW COMPARISON:  Single-view of the chest 11/30/2019 and 11/29/2019. FINDINGS: Tracheostomy tube and right PICC are unchanged. Aeration in the left chest is markedly improved since the most recent exam. Somewhat coarse appearing bilateral pulmonary opacities with a basilar predominance persist. Heart size is upper normal. No pneumothorax or pleural fluid. IMPRESSION: Marked improvement in airspace disease in the left lung since the most recent exam. Coarse bilateral interstitial opacities have an appearance suggestive of fibrosis but could represent infection.  Electronically Signed   By: Drusilla Kanner M.D.   On: 12/04/2019 13:02    Assessment/Plan Active Problems:   Acute on chronic respiratory failure with hypoxia (HCC)   COVID-19 virus infection   Acute respiratory distress syndrome (ARDS) due to COVID-19 virus (HCC)   Pneumonia due to COVID-19 virus   Acute renal failure due to tubular necrosis (HCC)   1. Acute on chronic respiratory failure hypoxia patient will continue on full support ventilator at this time awaiting sputum culture results.  Tinea supportive measures and pulmonary toilet. 2. COVID-19 virus infection in resolution phase slow  improvement we will monitor 3. ARDS also showing slow improvement there is improvement in the last chest x-ray in the airspace disease 4. Pneumonia due to COVID-19 slow improvement we will monitor 5. Acute renal failure slow improvement in the acidemia patient overall appears to be heading in the right direction   I have personally seen and evaluated the patient, evaluated laboratory and imaging results, formulated the assessment and plan and placed orders. The Patient requires high complexity decision making with multiple systems involvement.  Rounds were done with the Respiratory Therapy Director and Staff therapists and discussed with nursing staff also.  Yevonne Pax, MD [facility] Pulmonary Critical Care Medicine Sleep Medicine

## 2019-12-05 DIAGNOSIS — J80 Acute respiratory distress syndrome: Secondary | ICD-10-CM | POA: Diagnosis not present

## 2019-12-05 DIAGNOSIS — J9621 Acute and chronic respiratory failure with hypoxia: Secondary | ICD-10-CM | POA: Diagnosis not present

## 2019-12-05 DIAGNOSIS — U071 COVID-19: Secondary | ICD-10-CM | POA: Diagnosis not present

## 2019-12-05 DIAGNOSIS — N170 Acute kidney failure with tubular necrosis: Secondary | ICD-10-CM | POA: Diagnosis not present

## 2019-12-05 LAB — BASIC METABOLIC PANEL
Anion gap: 9 (ref 5–15)
BUN: 77 mg/dL — ABNORMAL HIGH (ref 6–20)
CO2: 29 mmol/L (ref 22–32)
Calcium: 7.9 mg/dL — ABNORMAL LOW (ref 8.9–10.3)
Chloride: 101 mmol/L (ref 98–111)
Creatinine, Ser: 1.36 mg/dL — ABNORMAL HIGH (ref 0.61–1.24)
GFR calc Af Amer: >60 mL/min (ref >60)
GFR calc non Af Amer: 58 mL/min — ABNORMAL LOW (ref >60)
Glucose, Bld: 144 mg/dL — ABNORMAL HIGH (ref 70–99)
Potassium: 4.3 mmol/L (ref 3.5–5.1)
Sodium: 139 mmol/L (ref 135–145)

## 2019-12-05 LAB — TRIGLYCERIDES: Triglycerides: 109 mg/dL (ref <150)

## 2019-12-05 NOTE — Progress Notes (Signed)
Pulmonary Critical Care Medicine [facility] [place]   PULMONARY CRITICAL CARE SERVICE  PROGRESS NOTE  Date of Service: 12/05/2019  Danny Leach  HFW:[id number]  DOB: 1966/03/11   DOA: 2019/12/15  Referring Physician: Carron Curie, MD  HPI: Danny Leach is a 54 y.o. male seen for follow up of Acute on Chronic Respiratory Failure.  Patient currently is on pressure assist control mode has been on 45% FiO2 good saturations are noted currently on a PEEP of 8  Medications: Reviewed on Rounds  Physical Exam:  Vitals: Temperature 98.2 pulse 107 respiratory 28 blood pressure is 140/80 saturations 99%  Ventilator Settings on pressure assist control FiO2 45% tidal volume 697 IP 24 PEEP 8   General: Comfortable at this time  Eyes: Grossly normal lids, irises & conjunctiva  ENT: grossly tongue is normal  Neck: no obvious mass  Cardiovascular: S1 S2 normal no gallop  Respiratory: No rhonchi coarse breath sounds  Abdomen: soft  Skin: no rash seen on limited exam  Musculoskeletal: not rigid  Psychiatric:unable to assess  Neurologic: no seizure no involuntary movements         Lab Data:   Basic Metabolic Panel: Recent Labs  Lab 11/30/19 0709 12/01/19 0359 12/02/19 0948 12/04/19 1426 12/05/19 1136  NA 150* 148* 147* 140 139  K 4.8 5.2* 4.1 4.9 4.3  CL 106 103 104 101 101  CO2 37* 36* 34* 32 29  GLUCOSE 186* 177* 170* 181* 144*  BUN 136* 154* 143* 84* 77*  CREATININE 1.67* 1.88* 1.68* 1.31* 1.36*  CALCIUM 8.6* 9.0 8.5* 7.9* 7.9*  MG 2.7*  --   --   --   --     ABG: Recent Labs  Lab 11/29/19 1100 11/29/19 1628 11/29/19 2230 11/30/19 0555 12/02/19 0544  PHART 7.259* 7.185* 7.196* 7.277* 7.386  PCO2ART 91.3* 108* 104* 83.8* 62.7*  PO2ART 77.4* 77.7* 88.5 70.7* 87.4  HCO3 39.4* 39.5* 38.6* 37.8* 36.7*  O2SAT 95.8 96.1 97.4 95.2 97.8    Liver Function Tests: Recent Labs  Lab 11/29/19 0558  AST 34  ALT 25  ALKPHOS 78  BILITOT 0.4   PROT 7.0  ALBUMIN 2.1*   No results for input(s): LIPASE, AMYLASE in the last 168 hours. No results for input(s): AMMONIA in the last 168 hours.  CBC: Recent Labs  Lab 11/29/19 0558 11/30/19 0709 12/04/19 1426  WBC 19.9* 12.5* 13.3*  NEUTROABS 16.4*  --   --   HGB 9.5* 8.0* 8.2*  HCT 32.1* 27.6* 26.3*  MCV 102.2* 101.1* 95.6  PLT 203 173 112*    Cardiac Enzymes: No results for input(s): CKTOTAL, CKMB, CKMBINDEX, TROPONINI in the last 168 hours.  BNP (last 3 results) No results for input(s): BNP in the last 8760 hours.  ProBNP (last 3 results) No results for input(s): PROBNP in the last 8760 hours.  Radiological Exams: DG CHEST PORT 1 VIEW  Result Date: 12/04/2019 CLINICAL DATA:  Fever. EXAM: PORTABLE CHEST 1 VIEW COMPARISON:  Single-view of the chest 11/30/2019 and 11/29/2019. FINDINGS: Tracheostomy tube and right PICC are unchanged. Aeration in the left chest is markedly improved since the most recent exam. Somewhat coarse appearing bilateral pulmonary opacities with a basilar predominance persist. Heart size is upper normal. No pneumothorax or pleural fluid. IMPRESSION: Marked improvement in airspace disease in the left lung since the most recent exam. Coarse bilateral interstitial opacities have an appearance suggestive of fibrosis but could represent infection. Electronically Signed   By: Drusilla Kanner M.D.  On: 12/04/2019 13:02    Assessment/Plan Active Problems:   Acute on chronic respiratory failure with hypoxia (HCC)   COVID-19 virus infection   Acute respiratory distress syndrome (ARDS) due to COVID-19 virus (HCC)   Pneumonia due to COVID-19 virus   Acute renal failure due to tubular necrosis (HCC)   1. Acute on chronic respiratory failure hypoxia plan is to continue to try to decrease the patient's vent support showing some clinical improvement we will continue to monitor. 2. COVID-19 virus infection in resolution phase slow improvement we will  monitor 3. ARDS also showing slow improvement there is improvement in the last chest x-ray in the airspace disease 4. Pneumonia due to COVID-19 slow improvement we will monitor 5. Acute renal failure slow improvement in the acidemia patient overall appears to be heading in the right direction   I have personally seen and evaluated the patient, evaluated laboratory and imaging results, formulated the assessment and plan and placed orders. The Patient requires high complexity decision making with multiple systems involvement.  Rounds were done with the Respiratory Therapy Director and Staff therapists and discussed with nursing staff also.  Yevonne Pax, MD [facility] Pulmonary Critical Care Medicine Sleep Medicine

## 2019-12-05 NOTE — Progress Notes (Signed)
[facility]  ROUNDING NOTE   Subjective:  Patient seen and evaluated at bedside. Still on the ventilator. Significant improvement in azotemia noted. BUN down to 84. Creatinine down to 1.3. Serum sodium also improved and down to 140.   Objective:  Vital signs in last 24 hours:  Temperature 98.7 pulse 107 respirations 20 blood pressure 140/80  Physical Exam: General: Critically ill-appearing  Head: Normocephalic, atraumatic. Moist oral mucosal membranes  Eyes: Anicteric  Neck: Tracheostomy in place  Lungs:  Bilateral rhonchi, vent assisted  Heart: S1S2 no rubs  Abdomen:  Soft, nontender, bowel sounds present  Extremities: Trace peripheral edema.  Neurologic: Awake, alert, on ventilator  Skin: No lesions       Basic Metabolic Panel: Recent Labs  Lab 11/29/19 0558 11/29/19 0558 11/30/19 0709 11/30/19 0709 12/01/19 0359 12/02/19 0948 12/04/19 1426  NA 153*  --  150*  --  148* 147* 140  K 5.0  --  4.8  --  5.2* 4.1 4.9  CL 106  --  106  --  103 104 101  CO2 39*  --  37*  --  36* 34* 32  GLUCOSE 161*  --  186*  --  177* 170* 181*  BUN 123*  --  136*  --  154* 143* 84*  CREATININE 1.75*  --  1.67*  --  1.88* 1.68* 1.31*  CALCIUM 8.8*   < > 8.6*   < > 9.0 8.5* 7.9*  MG  --   --  2.7*  --   --   --   --    < > = values in this interval not displayed.    Liver Function Tests: Recent Labs  Lab 11/29/19 0558  AST 34  ALT 25  ALKPHOS 78  BILITOT 0.4  PROT 7.0  ALBUMIN 2.1*   No results for input(s): LIPASE, AMYLASE in the last 168 hours. No results for input(s): AMMONIA in the last 168 hours.  CBC: Recent Labs  Lab 11/29/19 0558 11/30/19 0709 12/04/19 1426  WBC 19.9* 12.5* 13.3*  NEUTROABS 16.4*  --   --   HGB 9.5* 8.0* 8.2*  HCT 32.1* 27.6* 26.3*  MCV 102.2* 101.1* 95.6  PLT 203 173 112*    Cardiac Enzymes: No results for input(s): CKTOTAL, CKMB, CKMBINDEX, TROPONINI in the last 168 hours.  BNP: Invalid input(s): POCBNP  CBG: No  results for input(s): GLUCAP in the last 168 hours.  Microbiology: Results for orders placed or performed during the hospital encounter of 12-16-19  Culture, respiratory     Status: None   Collection Time: 11/29/19  9:21 AM   Specimen: Tracheal Aspirate; Respiratory  Result Value Ref Range Status   Specimen Description TRACHEAL ASPIRATE  Final   Special Requests NONE  Final   Gram Stain   Final    FEW WBC PRESENT, PREDOMINANTLY PMN ABUNDANT GRAM POSITIVE COCCI ABUNDANT GRAM POSITIVE RODS MODERATE GRAM NEGATIVE RODS    Culture   Final    RARE Consistent with normal respiratory flora. Performed at [facility], [address] [place], [place] [address]    Report Status 12/01/2019 FINAL  Final  Culture, respiratory (non-expectorated)     Status: None (Preliminary result)   Collection Time: 12/03/19  1:45 PM   Specimen: Tracheal Aspirate; Respiratory  Result Value Ref Range Status   Specimen Description TRACHEAL ASPIRATE  Final   Special Requests NONE  Final   Gram Stain   Final    RARE WBC  PRESENT,BOTH PMN AND MONONUCLEAR RARE GRAM POSITIVE COCCI RARE YEAST RARE GRAM POSITIVE RODS    Culture   Final    CULTURE REINCUBATED FOR BETTER GROWTH Performed at [facility], [address], [place], [place] [address]    Report Status PENDING  Incomplete  Culture, respiratory (non-expectorated)     Status: None (Preliminary result)   Collection Time: 12/04/19  1:53 PM   Specimen: Tracheal Aspirate; Respiratory  Result Value Ref Range Status   Specimen Description TRACHEAL ASPIRATE  Final   Special Requests NONE  Final   Gram Stain   Final    RARE WBC PRESENT, PREDOMINANTLY PMN FEW GRAM POSITIVE COCCI IN PAIRS IN CLUSTERS RARE GRAM NEGATIVE RODS    Culture   Final    CULTURE REINCUBATED FOR BETTER GROWTH Performed at [facility], [address], [place], [place] [address]    Report Status PENDING  Incomplete    Coagulation Studies: No results for  input(s): LABPROT, INR in the last 72 hours.  Urinalysis: No results for input(s): COLORURINE, LABSPEC, PHURINE, GLUCOSEU, HGBUR, BILIRUBINUR, KETONESUR, PROTEINUR, UROBILINOGEN, NITRITE, LEUKOCYTESUR in the last 72 hours.  Invalid input(s): APPERANCEUR    Imaging: DG CHEST PORT 1 VIEW  Result Date: 12/04/2019 CLINICAL DATA:  Fever. EXAM: PORTABLE CHEST 1 VIEW COMPARISON:  Single-view of the chest 11/30/2019 and 11/29/2019. FINDINGS: Tracheostomy tube and right PICC are unchanged. Aeration in the left chest is markedly improved since the most recent exam. Somewhat coarse appearing bilateral pulmonary opacities with a basilar predominance persist. Heart size is upper normal. No pneumothorax or pleural fluid. IMPRESSION: Marked improvement in airspace disease in the left lung since the most recent exam. Coarse bilateral interstitial opacities have an appearance suggestive of fibrosis but could represent infection. Electronically Signed   By: Drusilla Kanner M.D.   On: 12/04/2019 13:02     Medications:       Assessment/ Plan:  54 y.o. male a PMHx of recent COVID-19 infection with resultant respiratory failure status post tracheostomy placement, hypertension, hyperlipidemia, tobacco abuse, ARDS, PEG tube placement who was admitted to [facility] on 11/27/2019 for ongoing treatment of acute respiratory failure.  1.  Acute kidney injury suspect most likely underlying ATN.  Patient had azotemia out of proportion to underlying creatinine rise.  This is likely multifactorial secondary to concentrated tube feeds, steroids. -Azotemia significantly improved over the weekend.  BUN down to 84 with creatinine down to 1.3.  Continue D5W for 1 additional day.  Also receiving free water flush.  2.  Hypernatremia.  Significantly improved.  Serum sodium down to 140.  Continue D5W for 1 additional day.  3.  Acute respiratory failure.  Weaning from the ventilator as per pulmonary/critical care.   LOS:  0 Danny Leach 7/19/20218:38 AM

## 2019-12-06 DIAGNOSIS — U071 COVID-19: Secondary | ICD-10-CM | POA: Diagnosis not present

## 2019-12-06 DIAGNOSIS — N170 Acute kidney failure with tubular necrosis: Secondary | ICD-10-CM | POA: Diagnosis not present

## 2019-12-06 DIAGNOSIS — J80 Acute respiratory distress syndrome: Secondary | ICD-10-CM | POA: Diagnosis not present

## 2019-12-06 DIAGNOSIS — J9621 Acute and chronic respiratory failure with hypoxia: Secondary | ICD-10-CM | POA: Diagnosis not present

## 2019-12-06 LAB — CULTURE, RESPIRATORY W GRAM STAIN: Culture: NORMAL

## 2019-12-06 NOTE — Progress Notes (Signed)
Pulmonary Critical Care Medicine [facility] [place]   PULMONARY CRITICAL CARE SERVICE  PROGRESS NOTE  Date of Service: 12/06/2019  Danny Leach  IWL:[id number]  DOB: 05-Nov-1965   DOA: 11/25/2019  Referring Physician: Carron Curie, MD  HPI: Danny Leach is a 54 y.o. male seen for follow up of Acute on Chronic Respiratory Failure.  Patient currently is on pressure control mode has been on 45% FiO2.  We are gradually weaning the FiO2 down patient seems to be tolerating it fairly well so far.  Medications: Reviewed on Rounds  Physical Exam:  Vitals: Temperature is 98.4 pulse 110 respiratory rate 28 blood pressure is 127/68 saturations 93%  Ventilator Settings on pressure assist control FiO2 is 45% tidal volume 769 PEEP 8 IP 24  . General: Comfortable at this time . Eyes: Grossly normal lids, irises & conjunctiva . ENT: grossly tongue is normal . Neck: no obvious mass . Cardiovascular: S1 S2 normal no gallop . Respiratory: No rhonchi with no rales are noted this time . Abdomen: soft . Skin: no rash seen on limited exam . Musculoskeletal: not rigid . Psychiatric:unable to assess . Neurologic: no seizure no involuntary movements         Lab Data:   Basic Metabolic Panel: Recent Labs  Lab 11/30/19 0709 12/01/19 0359 12/02/19 0948 12/04/19 1426 12/05/19 1136  NA 150* 148* 147* 140 139  K 4.8 5.2* 4.1 4.9 4.3  CL 106 103 104 101 101  CO2 37* 36* 34* 32 29  GLUCOSE 186* 177* 170* 181* 144*  BUN 136* 154* 143* 84* 77*  CREATININE 1.67* 1.88* 1.68* 1.31* 1.36*  CALCIUM 8.6* 9.0 8.5* 7.9* 7.9*  MG 2.7*  --   --   --   --     ABG: Recent Labs  Lab 11/29/19 2230 11/30/19 0555 12/02/19 0544  PHART 7.196* 7.277* 7.386  PCO2ART 104* 83.8* 62.7*  PO2ART 88.5 70.7* 87.4  HCO3 38.6* 37.8* 36.7*  O2SAT 97.4 95.2 97.8    Liver Function Tests: No results for input(s): AST, ALT, ALKPHOS, BILITOT, PROT, ALBUMIN in the last 168 hours. No results for  input(s): LIPASE, AMYLASE in the last 168 hours. No results for input(s): AMMONIA in the last 168 hours.  CBC: Recent Labs  Lab 11/30/19 0709 12/04/19 1426  WBC 12.5* 13.3*  HGB 8.0* 8.2*  HCT 27.6* 26.3*  MCV 101.1* 95.6  PLT 173 112*    Cardiac Enzymes: No results for input(s): CKTOTAL, CKMB, CKMBINDEX, TROPONINI in the last 168 hours.  BNP (last 3 results) No results for input(s): BNP in the last 8760 hours.  ProBNP (last 3 results) No results for input(s): PROBNP in the last 8760 hours.  Radiological Exams: No results found.  Assessment/Plan Active Problems:   Acute on chronic respiratory failure with hypoxia (HCC)   COVID-19 virus infection   Acute respiratory distress syndrome (ARDS) due to COVID-19 virus (HCC)   Pneumonia due to COVID-19 virus   Acute renal failure due to tubular necrosis (HCC)   1. Acute on chronic respiratory failure with hypoxia patient continues on pressure control try to wean FiO2 down wean the PEEP down.  Clinically doing little bit better radiologically doing little bit better we are going to continue to monitor. 2. COVID-19 virus infection in resolution phase we will continue with present management 3. Pneumonia due to COVID-19 treated we will continue to follow 4. ARDS treated we will continue with present management 5. Acute renal failure due to tubular necrosis  appreciate nephrology input we will continue to monitor   I have personally seen and evaluated the patient, evaluated laboratory and imaging results, formulated the assessment and plan and placed orders. The Patient requires high complexity decision making with multiple systems involvement.  Rounds were done with the Respiratory Therapy Director and Staff therapists and discussed with nursing staff also.  Yevonne Pax, MD [facility] Pulmonary Critical Care Medicine Sleep Medicine

## 2019-12-07 DIAGNOSIS — J9621 Acute and chronic respiratory failure with hypoxia: Secondary | ICD-10-CM | POA: Diagnosis not present

## 2019-12-07 DIAGNOSIS — U071 COVID-19: Secondary | ICD-10-CM | POA: Diagnosis not present

## 2019-12-07 DIAGNOSIS — J80 Acute respiratory distress syndrome: Secondary | ICD-10-CM | POA: Diagnosis not present

## 2019-12-07 DIAGNOSIS — N170 Acute kidney failure with tubular necrosis: Secondary | ICD-10-CM | POA: Diagnosis not present

## 2019-12-07 NOTE — Progress Notes (Signed)
Pulmonary Critical Care Medicine [facility] [place]   PULMONARY CRITICAL CARE SERVICE  PROGRESS NOTE  Date of Service: 12/07/2019  Danny Leach  WVP:[id number]  DOB: 11/07/1965   DOA: 12-21-19  Referring Physician: Carron Curie, MD  HPI: Danny Leach is a 54 y.o. male seen for follow up of Acute on Chronic Respiratory Failure.  Patient currently is on pressure assist control mode has been on 60% FiO2 gradually trying to wean the FiO2 down  Medications: Reviewed on Rounds  Physical Exam:  Vitals: Temperature 96.9 pulse 103 respiratory rate 29 blood pressure is 149/80 saturations 96%  Ventilator Settings on pressure assist control FiO2 60% tidal volume 663 IP 24 PEEP 8  . General: Comfortable at this time . Eyes: Grossly normal lids, irises & conjunctiva . ENT: grossly tongue is normal . Neck: no obvious mass . Cardiovascular: S1 S2 normal no gallop . Respiratory: No rhonchi coarse breath sounds . Abdomen: soft . Skin: no rash seen on limited exam . Musculoskeletal: not rigid . Psychiatric:unable to assess . Neurologic: no seizure no involuntary movements         Lab Data:   Basic Metabolic Panel: Recent Labs  Lab 12/01/19 0359 12/02/19 0948 12/04/19 1426 12/05/19 1136  NA 148* 147* 140 139  K 5.2* 4.1 4.9 4.3  CL 103 104 101 101  CO2 36* 34* 32 29  GLUCOSE 177* 170* 181* 144*  BUN 154* 143* 84* 77*  CREATININE 1.88* 1.68* 1.31* 1.36*  CALCIUM 9.0 8.5* 7.9* 7.9*    ABG: Recent Labs  Lab 12/02/19 0544  PHART 7.386  PCO2ART 62.7*  PO2ART 87.4  HCO3 36.7*  O2SAT 97.8    Liver Function Tests: No results for input(s): AST, ALT, ALKPHOS, BILITOT, PROT, ALBUMIN in the last 168 hours. No results for input(s): LIPASE, AMYLASE in the last 168 hours. No results for input(s): AMMONIA in the last 168 hours.  CBC: Recent Labs  Lab 12/04/19 1426  WBC 13.3*  HGB 8.2*  HCT 26.3*  MCV 95.6  PLT 112*    Cardiac Enzymes: No results  for input(s): CKTOTAL, CKMB, CKMBINDEX, TROPONINI in the last 168 hours.  BNP (last 3 results) No results for input(s): BNP in the last 8760 hours.  ProBNP (last 3 results) No results for input(s): PROBNP in the last 8760 hours.  Radiological Exams: No results found.  Assessment/Plan Active Problems:   Acute on chronic respiratory failure with hypoxia (HCC)   COVID-19 virus infection   Acute respiratory distress syndrome (ARDS) due to COVID-19 virus (HCC)   Pneumonia due to COVID-19 virus   Acute renal failure due to tubular necrosis (HCC)   1. Acute on chronic respiratory failure hypoxia we will continue to try to wean the FiO2 down titrate oxygen continue pulmonary toilet. 2. COVID-19 virus infection in resolution phase we will continue to follow along 3. Acute respiratory distress at baseline we will continue to follow 4. Pneumonia due to COVID-19 treated improving 5. Acute renal failure at baseline we will continue with supportive care nephrology is following showing some improvement) there is   I have personally seen and evaluated the patient, evaluated laboratory and imaging results, formulated the assessment and plan and placed orders. The Patient requires high complexity decision making with multiple systems involvement.  Rounds were done with the Respiratory Therapy Director and Staff therapists and discussed with nursing staff also.  Yevonne Pax, MD [facility] Pulmonary Critical Care Medicine Sleep Medicine

## 2019-12-07 NOTE — Progress Notes (Signed)
[facility]  ROUNDING NOTE   Subjective:  BUN and creatinine continue to improve. BUN down to 77. Serum sodium also normalized at 139.   Objective:  Vital signs in last 24 hours:  Temperature 96.9 pulse 103 respirations 29 blood pressure 149/80 urine output 2.8 L over the preceding 24 hours.  Physical Exam: General: Critically ill-appearing  Head: Normocephalic, atraumatic. Moist oral mucosal membranes  Eyes: Anicteric  Neck: Tracheostomy in place  Lungs:  Bilateral rhonchi, vent assisted  Heart: S1S2 no rubs  Abdomen:  Soft, nontender, bowel sounds present  Extremities: Trace peripheral edema.  Neurologic: Awake, alert, on ventilator  Skin: No lesions       Basic Metabolic Panel: Recent Labs  Lab 12/01/19 0359 12/01/19 0359 12/02/19 0948 12/04/19 1426 12/05/19 1136  NA 148*  --  147* 140 139  K 5.2*  --  4.1 4.9 4.3  CL 103  --  104 101 101  CO2 36*  --  34* 32 29  GLUCOSE 177*  --  170* 181* 144*  BUN 154*  --  143* 84* 77*  CREATININE 1.88*  --  1.68* 1.31* 1.36*  CALCIUM 9.0   < > 8.5* 7.9* 7.9*   < > = values in this interval not displayed.    Liver Function Tests: No results for input(s): AST, ALT, ALKPHOS, BILITOT, PROT, ALBUMIN in the last 168 hours. No results for input(s): LIPASE, AMYLASE in the last 168 hours. No results for input(s): AMMONIA in the last 168 hours.  CBC: Recent Labs  Lab 12/04/19 1426  WBC 13.3*  HGB 8.2*  HCT 26.3*  MCV 95.6  PLT 112*    Cardiac Enzymes: No results for input(s): CKTOTAL, CKMB, CKMBINDEX, TROPONINI in the last 168 hours.  BNP: Invalid input(s): POCBNP  CBG: No results for input(s): GLUCAP in the last 168 hours.  Microbiology: Results for orders placed or performed during the hospital encounter of 2019/12/26  Culture, respiratory     Status: None   Collection Time: 11/29/19  9:21 AM   Specimen: Tracheal Aspirate; Respiratory  Result Value Ref Range Status   Specimen Description  TRACHEAL ASPIRATE  Final   Special Requests NONE  Final   Gram Stain   Final    FEW WBC PRESENT, PREDOMINANTLY PMN ABUNDANT GRAM POSITIVE COCCI ABUNDANT GRAM POSITIVE RODS MODERATE GRAM NEGATIVE RODS    Culture   Final    RARE Consistent with normal respiratory flora. Performed at [facility], [address], [place], [place] [address]    Report Status 12/01/2019 FINAL  Final  Culture, respiratory (non-expectorated)     Status: None   Collection Time: 12/03/19  1:45 PM   Specimen: Tracheal Aspirate; Respiratory  Result Value Ref Range Status   Specimen Description TRACHEAL ASPIRATE  Final   Special Requests NONE  Final   Gram Stain   Final    RARE WBC PRESENT,BOTH PMN AND MONONUCLEAR RARE GRAM POSITIVE COCCI RARE YEAST RARE GRAM POSITIVE RODS Performed at [facility], [address] [place], [place] [address]    Culture FEW CANDIDA TROPICALIS  Final   Report Status 12/06/2019 FINAL  Final  Culture, respiratory (non-expectorated)     Status: None   Collection Time: 12/04/19  1:53 PM   Specimen: Tracheal Aspirate; Respiratory  Result Value Ref Range Status   Specimen Description TRACHEAL ASPIRATE  Final   Special Requests NONE  Final   Gram Stain   Final    RARE  WBC PRESENT, PREDOMINANTLY PMN FEW GRAM POSITIVE COCCI IN PAIRS IN CLUSTERS RARE GRAM NEGATIVE RODS    Culture   Final    Consistent with normal respiratory flora. Performed at [facility], [address], [place], [place] [address]    Report Status 12/06/2019 FINAL  Final  Culture, blood (routine x 2)     Status: None (Preliminary result)   Collection Time: 12/04/19  2:26 PM   Specimen: BLOOD LEFT HAND  Result Value Ref Range Status   Specimen Description BLOOD LEFT HAND  Final   Special Requests   Final    BOTTLES DRAWN AEROBIC ONLY Blood Culture results may not be optimal due to an inadequate volume of blood received in culture bottles   Culture   Final    NO GROWTH 3 DAYS Performed  at [facility], [address], [place], [place] [address]    Report Status PENDING  Incomplete  Culture, blood (routine x 2)     Status: None (Preliminary result)   Collection Time: 12/04/19  2:26 PM   Specimen: BLOOD LEFT HAND  Result Value Ref Range Status   Specimen Description BLOOD LEFT HAND  Final   Special Requests   Final    BOTTLES DRAWN AEROBIC ONLY Blood Culture results may not be optimal due to an inadequate volume of blood received in culture bottles   Culture   Final    NO GROWTH 3 DAYS Performed at [facility], [address] [place], [place] [address]    Report Status PENDING  Incomplete    Coagulation Studies: No results for input(s): LABPROT, INR in the last 72 hours.  Urinalysis: No results for input(s): COLORURINE, LABSPEC, PHURINE, GLUCOSEU, HGBUR, BILIRUBINUR, KETONESUR, PROTEINUR, UROBILINOGEN, NITRITE, LEUKOCYTESUR in the last 72 hours.  Invalid input(s): APPERANCEUR    Imaging: No results found.   Medications:       Assessment/ Plan:  54 y.o. male a PMHx of recent COVID-19 infection with resultant respiratory failure status post tracheostomy placement, hypertension, hyperlipidemia, tobacco abuse, ARDS, PEG tube placement who was admitted to [facility] on 12/16/2019 for ongoing treatment of acute respiratory failure.  1.  Acute kidney injury suspect most likely underlying ATN.  Patient had azotemia out of proportion to underlying creatinine rise.  This is likely multifactorial secondary to concentrated tube feeds, steroids. -Patient continues to have excellent urine output. BUN down to 77 with a creatinine of 1.3. Both are improved as compared to last week. Agree with current tube feeds as they are less concentrated and will less likely lead to azotemia.  2.  Hypernatremia. Serum sodium improved down to 139. Continue current free water supplementation.  3.  Acute respiratory failure. Patient remains on the ventilator with FiO2  of 50%. Weaning protocol as per respiratory therapy as well as pulmonary/critical care.   LOS: 0 Marton Malizia 7/21/20211:28 PM

## 2019-12-08 ENCOUNTER — Other Ambulatory Visit (HOSPITAL_COMMUNITY): Payer: BLUE CROSS/BLUE SHIELD

## 2019-12-08 DIAGNOSIS — N170 Acute kidney failure with tubular necrosis: Secondary | ICD-10-CM | POA: Diagnosis not present

## 2019-12-08 DIAGNOSIS — J9621 Acute and chronic respiratory failure with hypoxia: Secondary | ICD-10-CM | POA: Diagnosis not present

## 2019-12-08 DIAGNOSIS — U071 COVID-19: Secondary | ICD-10-CM | POA: Diagnosis not present

## 2019-12-08 DIAGNOSIS — J80 Acute respiratory distress syndrome: Secondary | ICD-10-CM | POA: Diagnosis not present

## 2019-12-08 LAB — BASIC METABOLIC PANEL
Anion gap: 8 (ref 5–15)
Anion gap: 7 (ref 5–15)
BUN: 71 mg/dL — ABNORMAL HIGH (ref 6–20)
BUN: 73 mg/dL — ABNORMAL HIGH (ref 6–20)
CO2: 28 mmol/L (ref 22–32)
CO2: 31 mmol/L (ref 22–32)
Calcium: 7.9 mg/dL — ABNORMAL LOW (ref 8.9–10.3)
Calcium: 8.1 mg/dL — ABNORMAL LOW (ref 8.9–10.3)
Chloride: 108 mmol/L (ref 98–111)
Chloride: 106 mmol/L (ref 98–111)
Creatinine, Ser: 1.34 mg/dL — ABNORMAL HIGH (ref 0.61–1.24)
Creatinine, Ser: 1.36 mg/dL — ABNORMAL HIGH (ref 0.61–1.24)
GFR calc Af Amer: >60 mL/min (ref >60)
GFR calc Af Amer: >60 mL/min (ref >60)
GFR calc non Af Amer: 59 mL/min — ABNORMAL LOW (ref >60)
GFR calc non Af Amer: 58 mL/min — ABNORMAL LOW (ref >60)
Glucose, Bld: 135 mg/dL — ABNORMAL HIGH (ref 70–99)
Glucose, Bld: 134 mg/dL — ABNORMAL HIGH (ref 70–99)
Potassium: 4.1 mmol/L (ref 3.5–5.1)
Potassium: 4.3 mmol/L (ref 3.5–5.1)
Sodium: 144 mmol/L (ref 135–145)
Sodium: 144 mmol/L (ref 135–145)

## 2019-12-08 LAB — BLOOD GAS, ARTERIAL
Acid-Base Excess: 3.3 mmol/L — ABNORMAL HIGH (ref 0.0–2.0)
Acid-Base Excess: 3.3 mmol/L — ABNORMAL HIGH (ref 0.0–2.0)
Bicarbonate: 32.1 mmol/L — ABNORMAL HIGH (ref 20.0–28.0)
Bicarbonate: 31.7 mmol/L — ABNORMAL HIGH (ref 20.0–28.0)
FIO2: 50
FIO2: 50
O2 Saturation: 90.1 %
O2 Saturation: 98 %
Patient temperature: 37
Patient temperature: 36.6
pCO2 arterial: 103 mmHg (ref 32.0–48.0)
pCO2 arterial: 94 mmHg (ref 32.0–48.0)
pH, Arterial: 7.121 — CL (ref 7.350–7.450)
pH, Arterial: 7.151 — CL (ref 7.350–7.450)
pO2, Arterial: 64.6 mmHg — ABNORMAL LOW (ref 83.0–108.0)
pO2, Arterial: 105 mmHg (ref 83.0–108.0)

## 2019-12-08 LAB — CBC
HCT: 24.2 % — ABNORMAL LOW (ref 39.0–52.0)
Hemoglobin: 7.3 g/dL — ABNORMAL LOW (ref 13.0–17.0)
MCH: 30.2 pg (ref 26.0–34.0)
MCHC: 30.2 g/dL (ref 30.0–36.0)
MCV: 100 fL (ref 80.0–100.0)
Platelets: 130 10*3/uL — ABNORMAL LOW (ref 150–400)
RBC: 2.42 MIL/uL — ABNORMAL LOW (ref 4.22–5.81)
RDW: 20.5 % — ABNORMAL HIGH (ref 11.5–15.5)
WBC: 12.4 10*3/uL — ABNORMAL HIGH (ref 4.0–10.5)
nRBC: 0 % (ref 0.0–0.2)

## 2019-12-08 LAB — MAGNESIUM: Magnesium: 2.1 mg/dL (ref 1.7–2.4)

## 2019-12-08 LAB — TRIGLYCERIDES: Triglycerides: 164 mg/dL — ABNORMAL HIGH (ref <150)

## 2019-12-08 NOTE — Progress Notes (Signed)
Pulmonary Critical Care Medicine [facility] [place]   PULMONARY CRITICAL CARE SERVICE  PROGRESS NOTE  Date of Service: 12/08/2019  Danny Leach  ZOX:[id number]  DOB: [birth year]   DOA: 12/01/19  Referring Physician: Carron Curie, MD  HPI: Danny Leach is a 54 y.o. male seen for follow up of Acute on Chronic Respiratory Failure.  Patient has been having some issues with ventilation..  He has significant acidosis.  When I saw the patient patient was on a rate of 35 with tidal volumes running on the low side.  Chest x-ray had been done which shows diffuse pulmonary damage.  Spoke to respiratory therapy about adjusting the inspiratory time to try to give more time for expiration so we can now blow off more CO2.  Patient had been previously ventilating fine.  We will follow-up with serial ABGs to see if there is any further improvement  Medications: Reviewed on Rounds  Physical Exam:  Vitals: Temperature max 98.1 pulse 101 respiratory rate 30 blood pressure is 124/60 saturations 96%  Ventilator Settings on pressure assist control FiO2 is 50% PEEP 5 tidal volume is 520  . General: Comfortable at this time . Eyes: Grossly normal lids, irises & conjunctiva . ENT: grossly tongue is normal . Neck: no obvious mass . Cardiovascular: S1 S2 normal no gallop . Respiratory: Scattered rhonchi noted bilaterally . Abdomen: soft . Skin: no rash seen on limited exam . Musculoskeletal: not rigid . Psychiatric:unable to assess . Neurologic: no seizure no involuntary movements         Lab Data:   Basic Metabolic Panel: Recent Labs  Lab 12/02/19 0948 12/04/19 1426 12/05/19 1136 12/08/19 0634 12/08/19 0956  NA 147* 140 139 144 144  K 4.1 4.9 4.3 4.1 4.3  CL 104 101 101 108 106  CO2 34* 32 29 28 31   GLUCOSE 170* 181* 144* 135* 134*  BUN 143* 84* 77* 71* 73*  CREATININE 1.68* 1.31* 1.36* 1.34* 1.36*  CALCIUM 8.5* 7.9* 7.9* 7.9* 8.1*  MG  --   --   --  2.1  --      ABG: Recent Labs  Lab 12/02/19 0544 12/08/19 1204 12/08/19 1814  PHART 7.386 7.121* 7.151*  PCO2ART 62.7* 103* 94.0*  PO2ART 87.4 64.6* 105  HCO3 36.7* 32.1* 31.7*  O2SAT 97.8 90.1 98.0    Liver Function Tests: No results for input(s): AST, ALT, ALKPHOS, BILITOT, PROT, ALBUMIN in the last 168 hours. No results for input(s): LIPASE, AMYLASE in the last 168 hours. No results for input(s): AMMONIA in the last 168 hours.  CBC: Recent Labs  Lab 12/04/19 1426 12/08/19 0634  WBC 13.3* 12.4*  HGB 8.2* 7.3*  HCT 26.3* 24.2*  MCV 95.6 100.0  PLT 112* 130*    Cardiac Enzymes: No results for input(s): CKTOTAL, CKMB, CKMBINDEX, TROPONINI in the last 168 hours.  BNP (last 3 results) No results for input(s): BNP in the last 8760 hours.  ProBNP (last 3 results) No results for input(s): PROBNP in the last 8760 hours.  Radiological Exams: DG Chest Port 1 View  Result Date: 12/08/2019 CLINICAL DATA:  Hypoxemia EXAM: PORTABLE CHEST 1 VIEW COMPARISON:  12/04/2019 FINDINGS: Cardiac shadow is stable. Tracheostomy tube is noted in place. PICC line is noted in the distal superior vena cava and stable. Bilateral airspace opacities are again identified and relatively stable given some technical variation in the imaging. No bony abnormality is noted. IMPRESSION: Stable bilateral airspace disease. Electronically Signed   By: 12/06/2019  Lukens M.D.   On: 12/08/2019 16:47    Assessment/Plan Active Problems:   Acute on chronic respiratory failure with hypoxia (HCC)   COVID-19 virus infection   Acute respiratory distress syndrome (ARDS) due to COVID-19 virus (HCC)   Pneumonia due to COVID-19 virus   Acute renal failure due to tubular necrosis (HCC)   1. Acute on chronic respiratory failure with hypoxia patient had the ventilator associated with the PCO2 going down from 103 to 94 spoke with respiratory therapy to continue to adjust the ventilator gradually so that we can improve the ABG  instructions were given. 2. COVID-19 virus infection in resolution with severe pulmonary damage 3. ARDS as above residual damage on the chest films 4. Pneumonia due to COVID-19 treated clinically 5. Acute renal failure with tubular necrosis labs obtained showing some improvement we will follow   I have personally seen and evaluated the patient, evaluated laboratory and imaging results, formulated the assessment and plan and placed orders. The Patient requires high complexity decision making with multiple systems involvement.  Rounds were done with the Respiratory Therapy Director and Staff therapists and discussed with nursing staff also.  Yevonne Pax, MD [facility] Pulmonary Critical Care Medicine Sleep Medicine

## 2019-12-08 NOTE — Consult Note (Addendum)
Infectious Disease Consultation   Danny Leach  UXL:[id number]  DOB: 06-Apr-1966  DOA: 12/12/2019  Requesting physician: Dr. Manson PasseyBrown  Reason for consultation: Antibiotic recommendations   History of Present Illness: Danny Leach is an 54 y.o. male his medical history significant of hypertension, hyperlipidemia who was diagnosed with COVID-19 infection 10/07/2019.  He was at home until 10/17/2019 when he started developing worsening shortness of breath, dizziness, increased work of breathing.  Patient presented to the acute facility and was initially admitted to the medical ICU.  CT scan showed multifocal pneumonia with bilateral mediastinal hilar lymphadenopathy.  He was given Tocilizumab x1 on 10/18/2019 and completed 15-day extended course of Decadron.  He was also diuresed.  Patient continued to have hypoxemia and was given Solu-Medrol as well.  On 11/06/2019 chest x-ray showed pulmonary edema and he was diuresed with some improvement.  On 11/06/2019 patient unfortunately desaturated into the 60s and was subsequently intubated and received multiple sedative drips for ventilator synchrony.  His hospital course was complicated with acute kidney injury at the outside facility.  On 11/17/2018 when he was started on empiric IV vancomycin and cefepime for pneumonia and he completed 7-day course of antibiotics.  His respiratory cultures after that were negative.  On 11/22/2018 when he underwent tracheostomy and PEG tube placement.  He unfortunately had encephalopathy.  However, head CT did not show any acute findings. He was transferred to [facility] and was admitted here on 12/01/2019 on prophylactic Bactrim and prednisone taper.  After admission here patient was found to be acidotic.  Pulmonary has been following the patient.  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  Patient started having high fevers with temperature of 101 on 12/04/2019.  He was started on  empiric IV vancomycin, Zosyn.  In the interim he also received treatment with Levaquin.  Blood cultures from 12/04/2019 did not show any growth in 4 days.  Tracheal aspirate cultures consistent with normal respiratory flora.  Patient currently intubated.  He is on 50% FiO2 with PEEP of 9.  Review of Systems:  Patient sedated.  Unable to obtain review of systems at this time.   Past Medical History: Past Medical History:  Diagnosis Date  . Acute on chronic respiratory failure with hypoxia (HCC)   . Acute renal failure due to tubular necrosis (HCC)   . Acute respiratory distress syndrome (ARDS) due to COVID-19 virus (HCC)   . COVID-19 virus infection   . Pneumonia due to COVID-19 virus   Hypertension, hyperlipidemia  Past Surgical History: History of tonsillectomy, trach and PEG placement  Allergies: No known drug allergies  Social History: Patient apparently has history of smoking and smoked every day for 40 years.  Unable to obtain other information about alcohol or recreational drug abuse.  Family History: Coronary disease, hypertension, diabetes mellitus, prostate cancer in father.  Physical Exam: Vitals: T-max 100.3, pulse 107, respiratory 36, blood pressure 150/82, pulse oximetry 92%, on 50% FiO2, PEEP of 7. Constitutional: Ill-appearing male, sedated Head: Atraumatic, normocephalic Eyes: PERLA ENMT: external ears and nose appear normal, Lips appears normal, poor dentition  Neck: has trach in place CVS: S1-S2 Respiratory: Rhonchi, no wheezing Abdomen: soft, nondistended, positive bowel sounds Musculoskeletal: Mild bilateral lower extremity edema Neuro: Patient with trach on the vent, sedated, not following commands.  Unable to do neurologic exam at this time. Psych: Unable to assess Skin: Unstageable sacrococcygeal pressure ulcer, no rashes or  lesions or ulcers, no induration or nodules   Data reviewed:  I have personally reviewed following labs and imaging  studies Labs:  CBC: Recent Labs  Lab 12/04/19 1426 12/08/19 0634  WBC 13.3* 12.4*  HGB 8.2* 7.3*  HCT 26.3* 24.2*  MCV 95.6 100.0  PLT 112* 130*    Basic Metabolic Panel: Recent Labs  Lab 12/02/19 0948 12/02/19 0948 12/04/19 1426 12/04/19 1426 12/05/19 1136 12/05/19 1136 12/08/19 0634 12/08/19 0956  NA 147*  --  140  --  139  --  144 144  K 4.1   < > 4.9   < > 4.3   < > 4.1 4.3  CL 104  --  101  --  101  --  108 106  CO2 34*  --  32  --  29  --  28 31  GLUCOSE 170*  --  181*  --  144*  --  135* 134*  BUN 143*  --  84*  --  77*  --  71* 73*  CREATININE 1.68*  --  1.31*  --  1.36*  --  1.34* 1.36*  CALCIUM 8.5*  --  7.9*  --  7.9*  --  7.9* 8.1*  MG  --   --   --   --   --   --  2.1  --    < > = values in this interval not displayed.   GFR CrCl cannot be calculated (Unknown ideal weight.). Liver Function Tests: No results for input(s): AST, ALT, ALKPHOS, BILITOT, PROT, ALBUMIN in the last 168 hours. No results for input(s): LIPASE, AMYLASE in the last 168 hours. No results for input(s): AMMONIA in the last 168 hours. Coagulation profile No results for input(s): INR, PROTIME in the last 168 hours.  Cardiac Enzymes: No results for input(s): CKTOTAL, CKMB, CKMBINDEX, TROPONINI in the last 168 hours. BNP: Invalid input(s): POCBNP CBG: No results for input(s): GLUCAP in the last 168 hours. D-Dimer No results for input(s): DDIMER in the last 72 hours. Hgb A1c No results for input(s): HGBA1C in the last 72 hours. Lipid Profile Recent Labs    12/08/19 0956  TRIG 164*   Thyroid function studies No results for input(s): TSH, T4TOTAL, T3FREE, THYROIDAB in the last 72 hours.  Invalid input(s): FREET3 Anemia work up No results for input(s): VITAMINB12, FOLATE, FERRITIN, TIBC, IRON, RETICCTPCT in the last 72 hours. Urinalysis No results found for: COLORURINE, APPEARANCEUR, LABSPEC, PHURINE, GLUCOSEU, HGBUR, BILIRUBINUR, KETONESUR, PROTEINUR, UROBILINOGEN, NITRITE,  LEUKOCYTESUR   Microbiology Recent Results (from the past 240 hour(s))  Culture, respiratory     Status: None   Collection Time: 11/29/19  9:21 AM   Specimen: Tracheal Aspirate; Respiratory  Result Value Ref Range Status   Specimen Description TRACHEAL ASPIRATE  Final   Special Requests NONE  Final   Gram Stain   Final    FEW WBC PRESENT, PREDOMINANTLY PMN ABUNDANT GRAM POSITIVE COCCI ABUNDANT GRAM POSITIVE RODS MODERATE GRAM NEGATIVE RODS    Culture   Final    RARE Consistent with normal respiratory flora. Performed at [facility], [address] [place], [place] [address]    Report Status 12/01/2019 FINAL  Final  Culture, respiratory (non-expectorated)     Status: None   Collection Time: 12/03/19  1:45 PM   Specimen: Tracheal Aspirate; Respiratory  Result Value Ref Range Status   Specimen Description TRACHEAL ASPIRATE  Final   Special Requests NONE  Final   Gram Stain  Final    RARE WBC PRESENT,BOTH PMN AND MONONUCLEAR RARE GRAM POSITIVE COCCI RARE YEAST RARE GRAM POSITIVE RODS Performed at [facility], [address] [place], [place] [address]    Culture FEW CANDIDA TROPICALIS  Final   Report Status 12/06/2019 FINAL  Final  Culture, respiratory (non-expectorated)     Status: None   Collection Time: 12/04/19  1:53 PM   Specimen: Tracheal Aspirate; Respiratory  Result Value Ref Range Status   Specimen Description TRACHEAL ASPIRATE  Final   Special Requests NONE  Final   Gram Stain   Final    RARE WBC PRESENT, PREDOMINANTLY PMN FEW GRAM POSITIVE COCCI IN PAIRS IN CLUSTERS RARE GRAM NEGATIVE RODS    Culture   Final    Consistent with normal respiratory flora. Performed at [facility], [address] [place], [place] [address]    Report Status 12/06/2019 FINAL  Final  Culture, blood (routine x 2)     Status: None (Preliminary result)   Collection Time: 12/04/19  2:26 PM   Specimen: BLOOD LEFT HAND  Result Value Ref Range Status    Specimen Description BLOOD LEFT HAND  Final   Special Requests   Final    BOTTLES DRAWN AEROBIC ONLY Blood Culture results may not be optimal due to an inadequate volume of blood received in culture bottles   Culture   Final    NO GROWTH 3 DAYS Performed at [facility], [address], [place], [place] [address]    Report Status PENDING  Incomplete  Culture, blood (routine x 2)     Status: None (Preliminary result)   Collection Time: 12/04/19  2:26 PM   Specimen: BLOOD LEFT HAND  Result Value Ref Range Status   Specimen Description BLOOD LEFT HAND  Final   Special Requests   Final    BOTTLES DRAWN AEROBIC ONLY Blood Culture results may not be optimal due to an inadequate volume of blood received in culture bottles   Culture   Final    NO GROWTH 3 DAYS Performed at [facility], [address], [place], [place] [address]    Report Status PENDING  Incomplete    Inpatient Medications:   Please see MAR   Radiological Exams on Admission: No results found.  Impression/Recommendations Active Problems:   Acute on chronic respiratory failure with hypoxia (HCC)   COVID-19 virus infection   Acute respiratory distress syndrome (ARDS) due to COVID-19 virus (HCC)   Pneumonia due to COVID-19 virus   Acute renal failure due to tubular necrosis (HCC) Systemic inflammatory response syndrome Leukocytosis Encephalopathy Sacrococcygeal pressure ulcer unstageable Dysphagia/malnutrition  Acute on chronic respiratory failure with hypoxemia: Initially started with COVID-19 infection.  He subsequently developed ARDS.  He status post treatment with Tocilizumab and Decadron at the outside facility.  Currently on hydroxyurea, folic acid on, prednisone.  Remains on the ventilator on 50% FiO2, PEEP of 9.  Respiratory culture showing normal flora which is highly concerning for ongoing aspiration.  Chest x-ray today per report stable bilateral airspace disease.  Currently started on IV  vancomycin, Zosyn.  Pulmonary following.  If he is not improving suggest repeat chest imaging preferably chest CT to better evaluate and switch to Unasyn.  If he is still not improving, then we would have no choice but to broaden antimicrobial coverage and switch to meropenem. However, I believe that the fact that the respiratory cultures are showing normal flora, this likely is highly  suspicious for aspiration which is contributing to the worsening respiratory failure.  Plan to treat for tentative duration of 1 week pending improvement.  That being said, if he has ongoing aspiration then he is at risk for worsening pneumonia despite being on antibiotics.  Pneumonia: Patient initially had COVID-19 infection with pneumonia.  He is at risk for ventilator associated pneumonia.  Remains on the ventilator.  He is already received treatment with Tocilizumab, Decadron for the COVID-19 infection.  Currently on hydroxyurea, folic acid.  He is also on prednisone.  Started on empiric IV antibiotics for concern for bacterial pneumonia with IV vancomycin, Zosyn.  Plan as mentioned above.  Again, as mentioned above respiratory cultures are showing normal respiratory flora which is concerning for aspiration in which case he is high risk for worsening pneumonia despite being on antibiotics.  For now plan to treat for tentative duration of 1 week pending improvement.  Acute renal failure: Likely secondary to ATN.  Nephrology consulted and following.  Please monitor BUN/cr closely while on antibiotics.  Avoid nephrotoxic medications.  Encephalopathy: Patient started on acyclovir for suspicion of herpes encephalitis.  Head CT at the acute facility did not show any acute findings.  Tentative end date for the acyclovir is 12/13/2019.  Please monitor BUN/trending closely while on the acyclovir.  Continue supportive management per the primary team.  COVID-19 infection: He already received treatment with Tocilizumab and Decadron as  mentioned above.  Currently on prednisone.  Patient also on prophylactic Bactrim while he is on the prednisone taper provided his BUN/creatinine remained stable.  If any worsening of his BUN and creatinine consider discontinuing the Bactrim while on the other antibiotics. Once he is done with the prednisone taper would recommend to discontinue the Bactrim.  Systemic inflammatory response syndrome with leukocytosis: On empiric antibiotics as mentioned above.  Blood cultures do not show any growth to date.  Continue to monitor.  Sacrococcygeal pressure ulcer unstageable: Continue local wound care.  Due to his debility and immobility he is very high risk for worsening of the wound.  If the wound is worsening consider consulting surgery for evaluation.  Dysphagia/malnutrition: Management per primary team.  Due to his dysphagia he is at risk for aspiration and worsening facial pain secondary to aspiration pneumonia.  Due to his complex medical problems he is very high risk for worsening and decompensation.   Thank you for this consultation.  Plan of care discussed with the primary team and pharmacy.   Vonzella Nipple M.D. 12/08/2019, 2:58 PM

## 2019-12-09 DIAGNOSIS — J9621 Acute and chronic respiratory failure with hypoxia: Secondary | ICD-10-CM | POA: Diagnosis not present

## 2019-12-09 DIAGNOSIS — J80 Acute respiratory distress syndrome: Secondary | ICD-10-CM | POA: Diagnosis not present

## 2019-12-09 DIAGNOSIS — N170 Acute kidney failure with tubular necrosis: Secondary | ICD-10-CM | POA: Diagnosis not present

## 2019-12-09 DIAGNOSIS — U071 COVID-19: Secondary | ICD-10-CM | POA: Diagnosis not present

## 2019-12-09 LAB — BLOOD GAS, ARTERIAL
Acid-Base Excess: 5.8 mmol/L — ABNORMAL HIGH (ref 0.0–2.0)
Bicarbonate: 31.6 mmol/L — ABNORMAL HIGH (ref 20.0–28.0)
FIO2: 50
O2 Saturation: 91.5 %
Patient temperature: 37
pCO2 arterial: 62.7 mmHg — ABNORMAL HIGH (ref 32.0–48.0)
pH, Arterial: 7.323 — ABNORMAL LOW (ref 7.350–7.450)
pO2, Arterial: 52.4 mmHg — ABNORMAL LOW (ref 83.0–108.0)

## 2019-12-09 LAB — CULTURE, BLOOD (ROUTINE X 2)
Culture: NO GROWTH
Culture: NO GROWTH

## 2019-12-09 LAB — VANCOMYCIN, TROUGH: Vancomycin Tr: 28 ug/mL (ref 15–20)

## 2019-12-09 NOTE — Progress Notes (Signed)
Pulmonary Critical Care Medicine [facility] [place]   PULMONARY CRITICAL CARE SERVICE  PROGRESS NOTE  Date of Service: 12/09/2019  Danny Leach  [id number]:[id number]  DOB: 10-26-1965   DOA: 12/05/2019  Referring Physician: Carron Curie, MD  HPI: Danny Leach is a 54 y.o. male seen for follow up of Acute on Chronic Respiratory Failure.  Patient is on the ventilator and pressure control mode FiO2 was increased to 70% however saturations this morning are 100% so therefore transfer FiO2 to be decreased again.  Ventilation is better with improvement in the pH  Medications: Reviewed on Rounds  Physical Exam:  Vitals: Temperature 96.7 pulse 88 respiratory 28 blood pressure is 150/74 saturations 92%  Ventilator Settings on pressure control FiO2 is 70% PEEP of 10 tidal volume is about 540  . General: Comfortable at this time . Eyes: Grossly normal lids, irises & conjunctiva . ENT: grossly tongue is normal . Neck: no obvious mass . Cardiovascular: S1 S2 normal no gallop . Respiratory: Scattered rhonchi expansion equal . Abdomen: soft . Skin: no rash seen on limited exam . Musculoskeletal: not rigid . Psychiatric:unable to assess . Neurologic: no seizure no involuntary movements         Lab Data:   Basic Metabolic Panel: Recent Labs  Lab 12/04/19 1426 12/05/19 1136 12/08/19 0634 12/08/19 0956  NA 140 139 144 144  K 4.9 4.3 4.1 4.3  CL 101 101 108 106  CO2 32 29 28 31   GLUCOSE 181* 144* 135* 134*  BUN 84* 77* 71* 73*  CREATININE 1.31* 1.36* 1.34* 1.36*  CALCIUM 7.9* 7.9* 7.9* 8.1*  MG  --   --  2.1  --     ABG: Recent Labs  Lab 12/08/19 1204 12/08/19 1814 12/09/19 0358  PHART 7.121* 7.151* 7.323*  PCO2ART 103* 94.0* 62.7*  PO2ART 64.6* 105 52.4*  HCO3 32.1* 31.7* 31.6*  O2SAT 90.1 98.0 91.5    Liver Function Tests: No results for input(s): AST, ALT, ALKPHOS, BILITOT, PROT, ALBUMIN in the last 168 hours. No results for input(s): LIPASE, AMYLASE  in the last 168 hours. No results for input(s): AMMONIA in the last 168 hours.  CBC: Recent Labs  Lab 12/04/19 1426 12/08/19 0634  WBC 13.3* 12.4*  HGB 8.2* 7.3*  HCT 26.3* 24.2*  MCV 95.6 100.0  PLT 112* 130*    Cardiac Enzymes: No results for input(s): CKTOTAL, CKMB, CKMBINDEX, TROPONINI in the last 168 hours.  BNP (last 3 results) No results for input(s): BNP in the last 8760 hours.  ProBNP (last 3 results) No results for input(s): PROBNP in the last 8760 hours.  Radiological Exams: DG Chest Port 1 View  Result Date: 12/08/2019 CLINICAL DATA:  Hypoxemia EXAM: PORTABLE CHEST 1 VIEW COMPARISON:  12/04/2019 FINDINGS: Cardiac shadow is stable. Tracheostomy tube is noted in place. PICC line is noted in the distal superior vena cava and stable. Bilateral airspace opacities are again identified and relatively stable given some technical variation in the imaging. No bony abnormality is noted. IMPRESSION: Stable bilateral airspace disease. Electronically Signed   By: 12/06/2019 M.D.   On: 12/08/2019 16:47    Assessment/Plan Active Problems:   Acute on chronic respiratory failure with hypoxia (HCC)   COVID-19 virus infection   Acute respiratory distress syndrome (ARDS) due to COVID-19 virus (HCC)   Pneumonia due to COVID-19 virus   Acute renal failure due to tubular necrosis (HCC)   1. Acute on chronic respiratory failure with hypoxia clinically is improved  from yesterday with improved saturations as well as ventilation based on the most recent ABG PCO2 is now down to about 62.  We should be able to decrease FiO2 also 2. COVID-19 virus infection in resolution still with significant airspace disease 3. ARDS oxygenation has improved since increasing PEEP will wean 4. Pneumonia due to COVID-19 following x-rays 5. Acute renal failure improved   I have personally seen and evaluated the patient, evaluated laboratory and imaging results, formulated the assessment and plan and placed  orders. The Patient requires high complexity decision making with multiple systems involvement.  Rounds were done with the Respiratory Therapy Director and Staff therapists and discussed with nursing staff also.  Yevonne Pax, MD [facility] Pulmonary Critical Care Medicine Sleep Medicine

## 2019-12-10 ENCOUNTER — Other Ambulatory Visit (HOSPITAL_COMMUNITY): Payer: BLUE CROSS/BLUE SHIELD

## 2019-12-10 DIAGNOSIS — N170 Acute kidney failure with tubular necrosis: Secondary | ICD-10-CM | POA: Diagnosis not present

## 2019-12-10 DIAGNOSIS — J9621 Acute and chronic respiratory failure with hypoxia: Secondary | ICD-10-CM | POA: Diagnosis not present

## 2019-12-10 DIAGNOSIS — U071 COVID-19: Secondary | ICD-10-CM | POA: Diagnosis not present

## 2019-12-10 DIAGNOSIS — J80 Acute respiratory distress syndrome: Secondary | ICD-10-CM | POA: Diagnosis not present

## 2019-12-10 LAB — CBC
HCT: 23.4 % — ABNORMAL LOW (ref 39.0–52.0)
Hemoglobin: 6.9 g/dL — CL (ref 13.0–17.0)
MCH: 30.7 pg (ref 26.0–34.0)
MCHC: 29.5 g/dL — ABNORMAL LOW (ref 30.0–36.0)
MCV: 104 fL — ABNORMAL HIGH (ref 80.0–100.0)
Platelets: 156 10*3/uL (ref 150–400)
RBC: 2.25 MIL/uL — ABNORMAL LOW (ref 4.22–5.81)
RDW: 21.4 % — ABNORMAL HIGH (ref 11.5–15.5)
WBC: 12.1 10*3/uL — ABNORMAL HIGH (ref 4.0–10.5)
nRBC: 0.2 % (ref 0.0–0.2)

## 2019-12-10 LAB — OCCULT BLOOD X 1 CARD TO LAB, STOOL: Fecal Occult Bld: NEGATIVE

## 2019-12-10 LAB — BASIC METABOLIC PANEL WITH GFR
Anion gap: 7 (ref 5–15)
BUN: 82 mg/dL — ABNORMAL HIGH (ref 6–20)
CO2: 30 mmol/L (ref 22–32)
Calcium: 8.2 mg/dL — ABNORMAL LOW (ref 8.9–10.3)
Chloride: 109 mmol/L (ref 98–111)
Creatinine, Ser: 1.34 mg/dL — ABNORMAL HIGH (ref 0.61–1.24)
GFR calc Af Amer: >60 mL/min
GFR calc non Af Amer: 59 mL/min — ABNORMAL LOW
Glucose, Bld: 129 mg/dL — ABNORMAL HIGH (ref 70–99)
Potassium: 4 mmol/L (ref 3.5–5.1)
Sodium: 146 mmol/L — ABNORMAL HIGH (ref 135–145)

## 2019-12-10 LAB — ABO/RH: ABO/RH(D): B POS

## 2019-12-10 LAB — VANCOMYCIN, TROUGH: Vancomycin Tr: 17 ug/mL (ref 15–20)

## 2019-12-10 LAB — MAGNESIUM: Magnesium: 2.2 mg/dL (ref 1.7–2.4)

## 2019-12-10 LAB — PREPARE RBC (CROSSMATCH)

## 2019-12-10 MED ORDER — IOHEXOL 350 MG/ML SOLN
100.0000 mL | Freq: Once | INTRAVENOUS | Status: AC | PRN
  Administered 2019-12-10: 100 mL via INTRAVENOUS

## 2019-12-11 DIAGNOSIS — U071 COVID-19: Secondary | ICD-10-CM | POA: Diagnosis not present

## 2019-12-11 DIAGNOSIS — J9621 Acute and chronic respiratory failure with hypoxia: Secondary | ICD-10-CM | POA: Diagnosis not present

## 2019-12-11 DIAGNOSIS — N170 Acute kidney failure with tubular necrosis: Secondary | ICD-10-CM | POA: Diagnosis not present

## 2019-12-11 DIAGNOSIS — J80 Acute respiratory distress syndrome: Secondary | ICD-10-CM | POA: Diagnosis not present

## 2019-12-11 LAB — TYPE AND SCREEN
ABO/RH(D): B POS
Antibody Screen: NEGATIVE
Unit division: 0

## 2019-12-11 LAB — BASIC METABOLIC PANEL
Anion gap: 5 (ref 5–15)
Anion gap: 7 (ref 5–15)
BUN: 75 mg/dL — ABNORMAL HIGH (ref 6–20)
BUN: 74 mg/dL — ABNORMAL HIGH (ref 6–20)
CO2: 32 mmol/L (ref 22–32)
CO2: 31 mmol/L (ref 22–32)
Calcium: 8.2 mg/dL — ABNORMAL LOW (ref 8.9–10.3)
Calcium: 7.6 mg/dL — ABNORMAL LOW (ref 8.9–10.3)
Chloride: 110 mmol/L (ref 98–111)
Chloride: 108 mmol/L (ref 98–111)
Creatinine, Ser: 1.3 mg/dL — ABNORMAL HIGH (ref 0.61–1.24)
Creatinine, Ser: 1.39 mg/dL — ABNORMAL HIGH (ref 0.61–1.24)
GFR calc Af Amer: >60 mL/min (ref >60)
GFR calc Af Amer: >60 mL/min (ref >60)
GFR calc non Af Amer: >60 mL/min (ref >60)
GFR calc non Af Amer: 56 mL/min — ABNORMAL LOW (ref >60)
Glucose, Bld: 112 mg/dL — ABNORMAL HIGH (ref 70–99)
Glucose, Bld: 284 mg/dL — ABNORMAL HIGH (ref 70–99)
Potassium: 4.4 mmol/L (ref 3.5–5.1)
Potassium: 4.3 mmol/L (ref 3.5–5.1)
Sodium: 147 mmol/L — ABNORMAL HIGH (ref 135–145)
Sodium: 146 mmol/L — ABNORMAL HIGH (ref 135–145)

## 2019-12-11 LAB — BPAM RBC
Blood Product Expiration Date: 202108222359
ISSUE DATE / TIME: 202107241756
Unit Type and Rh: 7300

## 2019-12-11 LAB — CBC
HCT: 27.8 % — ABNORMAL LOW (ref 39.0–52.0)
Hemoglobin: 8.6 g/dL — ABNORMAL LOW (ref 13.0–17.0)
MCH: 31.3 pg (ref 26.0–34.0)
MCHC: 30.9 g/dL (ref 30.0–36.0)
MCV: 101.1 fL — ABNORMAL HIGH (ref 80.0–100.0)
Platelets: 154 10*3/uL (ref 150–400)
RBC: 2.75 MIL/uL — ABNORMAL LOW (ref 4.22–5.81)
RDW: 21 % — ABNORMAL HIGH (ref 11.5–15.5)
WBC: 9.6 10*3/uL (ref 4.0–10.5)
nRBC: 0.3 % — ABNORMAL HIGH (ref 0.0–0.2)

## 2019-12-11 LAB — TRIGLYCERIDES: Triglycerides: 141 mg/dL (ref <150)

## 2019-12-11 LAB — OCCULT BLOOD X 1 CARD TO LAB, STOOL: Fecal Occult Bld: NEGATIVE

## 2019-12-11 NOTE — Progress Notes (Addendum)
Pulmonary Critical Care Medicine [facility] [place]   PULMONARY CRITICAL CARE SERVICE  PROGRESS NOTE  Date of Service: 12/11/2019  Dax Murguia  [id number]  DOB: [birth year]   DOA: 12/15/2019  Referring Physician: Carron Curie, MD  HPI: Danny Leach is a 54 y.o. male seen for follow up of Acute on Chronic Respiratory Failure.  Patient remains on pressure control mode rate of 30 with FiO2 of 50% satting well no fever or distress  Medications: Reviewed on Rounds  Physical Exam:  Vitals: Pulse 100 respirations 34 BP 139/74 O2 sat 97% temp 97.0  Ventilator Settings ventilator mode AC PC rate of 30 expiratory pressure 24 PEEP of 10 with FiO2 of 50%  . General: Comfortable at this time . Eyes: Grossly normal lids, irises & conjunctiva . ENT: grossly tongue is normal . Neck: no obvious mass . Cardiovascular: S1 S2 normal no gallop . Respiratory: No rales or rhonchi noted . Abdomen: soft . Skin: no rash seen on limited exam . Musculoskeletal: not rigid . Psychiatric:unable to assess . Neurologic: no seizure no involuntary movements         Lab Data:   Basic Metabolic Panel: Recent Labs  Lab 12/05/19 1136 12/08/19 0634 12/08/19 0956 12/10/19 1135 12/11/19 0533  NA 139 144 144 146* 147*  K 4.3 4.1 4.3 4.0 4.4  CL 101 108 106 109 110  CO2 29 28 31 30  32  GLUCOSE 144* 135* 134* 129* 112*  BUN 77* 71* 73* 82* 75*  CREATININE 1.36* 1.34* 1.36* 1.34* 1.30*  CALCIUM 7.9* 7.9* 8.1* 8.2* 8.2*  MG  --  2.1  --  2.2  --     ABG: Recent Labs  Lab 12/08/19 1204 12/08/19 1814 12/09/19 0358  PHART 7.121* 7.151* 7.323*  PCO2ART 103* 94.0* 62.7*  PO2ART 64.6* 105 52.4*  HCO3 32.1* 31.7* 31.6*  O2SAT 90.1 98.0 91.5    Liver Function Tests: No results for input(s): AST, ALT, ALKPHOS, BILITOT, PROT, ALBUMIN in the last 168 hours. No results for input(s): LIPASE, AMYLASE in the last 168 hours. No results for input(s): AMMONIA in the last 168  hours.  CBC: Recent Labs  Lab 12/08/19 0634 12/10/19 1135 12/11/19 0533  WBC 12.4* 12.1* 9.6  HGB 7.3* 6.9* 8.6*  HCT 24.2* 23.4* 27.8*  MCV 100.0 104.0* 101.1*  PLT 130* 156 154    Cardiac Enzymes: No results for input(s): CKTOTAL, CKMB, CKMBINDEX, TROPONINI in the last 168 hours.  BNP (last 3 results) No results for input(s): BNP in the last 8760 hours.  ProBNP (last 3 results) No results for input(s): PROBNP in the last 8760 hours.  Radiological Exams: CT ANGIO CHEST PE W OR WO CONTRAST  Result Date: 12/10/2019 CLINICAL DATA:  PE suspected EXAM: CT ANGIOGRAPHY CHEST WITH CONTRAST TECHNIQUE: Multidetector CT imaging of the chest was performed using the standard protocol during bolus administration of intravenous contrast. Multiplanar CT image reconstructions and MIPs were obtained to evaluate the vascular anatomy. CONTRAST:  12/12/2019 OMNIPAQUE IOHEXOL 350 MG/ML SOLN COMPARISON:  None. FINDINGS: Cardiovascular: Evaluation for pulmonary embolism is somewhat limited by breath motion artifact in lung bases, within this limitation, no evidence of pulmonary embolism through the segmental pulmonary arterial level. Mild cardiomegaly. No pericardial effusion. Enlargement of the main pulmonary artery measuring up to 4.0 cm in caliber. Mediastinum/Nodes: Numerous prominent mediastinal and hilar lymph nodes. Tracheostomy. Thyroid gland and esophagus demonstrate no significant findings. Lungs/Pleura: There is severe underlying pulmonary fibrosis with traction bronchiectasis and areas of  bronchiolectasis, not well evaluated due to acutely superimposed airspace disease. There is diffusely superimposed ground-glass opacity and areas of heterogeneous airspace opacity. Small bilateral pleural effusions. Upper Abdomen: No acute abnormality. Musculoskeletal: No chest wall abnormality. No acute or significant osseous findings. Review of the MIP images confirms the above findings. IMPRESSION: 1. Evaluation for  pulmonary embolism is somewhat limited by breath motion artifact in lung bases, within this limitation, no evidence of pulmonary embolism through the segmental pulmonary arterial level. 2. There is severe underlying pulmonary fibrosis with traction bronchiectasis and areas of bronchiolectasis, not well evaluated due to acutely superimposed airspace disease. 3. There is diffusely superimposed ground-glass opacity and areas of heterogeneous airspace opacity. Small bilateral pleural effusions. Findings are consistent with multifocal infection or inflammation. 4. Numerous prominent mediastinal and hilar lymph nodes, likely reactive. 5. Enlargement of the main pulmonary artery, as can be seen in pulmonary hypertension. 6. Cardiomegaly. 7. Tracheostomy. Electronically Signed   By: Lauralyn Primes M.D.   On: 12/10/2019 16:34    Assessment/Plan Active Problems:   Acute on chronic respiratory failure with hypoxia (HCC)   COVID-19 virus infection   Acute respiratory distress syndrome (ARDS) due to COVID-19 virus (HCC)   Pneumonia due to COVID-19 virus   Acute renal failure due to tubular necrosis (HCC)   1. Acute on chronic respiratory failure with hypoxia patient mains at 50% FiO2 on pressure control mode currently on PEEP of 10.  Continue to attempt weaning as tolerated.  Continue aggressive pulmonary toilet supportive measures. 2. COVID-19 virus infection in resolution still with significant airspace disease 3. ARDS oxygenation has improved since increasing PEEP will wean 4. Pneumonia due to COVID-19 following x-rays 5. Acute renal failure improved   I have personally seen and evaluated the patient, evaluated laboratory and imaging results, formulated the assessment and plan and placed orders. The Patient requires high complexity decision making with multiple systems involvement.  Rounds were done with the Respiratory Therapy Director and Staff therapists and discussed with nursing staff also.  Yevonne Pax, MD [facility] Pulmonary Critical Care Medicine Sleep Medicine

## 2019-12-11 NOTE — Progress Notes (Addendum)
Pulmonary Critical Care Medicine [facility] [place]   PULMONARY CRITICAL CARE SERVICE  PROGRESS NOTE  Date of Service: 12/10/2019  Danny Leach  ZOX:[id number]  DOB: 03-16-1966   DOA: 11/29/2019  Referring Physician: Carron Curie, MD  Danny Leach is a 54 y.o. male seen for follow up of Acute on Chronic Respiratory Failure.  Patient remains on full support pressure control mode with FiO2 of 50% settings time no distress.  Medications: Reviewed on Rounds  Physical Exam:  Vitals: Pulse 92 respirations 33 BP 112/64 O2 sat 98% temp 97.4  Ventilator Settings ventilator mode AC PC rate of 30 expiratory pressure of 24 PEEP of 10 with an FiO2 of 50%  . General: Comfortable at this time . Eyes: Grossly normal lids, irises & conjunctiva . ENT: grossly tongue is normal . Neck: no obvious mass . Cardiovascular: S1 S2 normal no gallop . Respiratory: No rales or rhonchi noted . Abdomen: soft . Skin: no rash seen on limited exam . Musculoskeletal: not rigid . Psychiatric:unable to assess . Neurologic: no seizure no involuntary movements         Lab Data:   Basic Metabolic Panel: Recent Labs  Lab 12/05/19 1136 12/08/19 0634 12/08/19 0956 12/10/19 1135 12/11/19 0533  NA 139 144 144 146* 147*  K 4.3 4.1 4.3 4.0 4.4  CL 101 108 106 109 110  CO2 29 28 31 30  32  GLUCOSE 144* 135* 134* 129* 112*  BUN 77* 71* 73* 82* 75*  CREATININE 1.36* 1.34* 1.36* 1.34* 1.30*  CALCIUM 7.9* 7.9* 8.1* 8.2* 8.2*  MG  --  2.1  --  2.2  --     ABG: Recent Labs  Lab 12/08/19 1204 12/08/19 1814 12/09/19 0358  PHART 7.121* 7.151* 7.323*  PCO2ART 103* 94.0* 62.7*  PO2ART 64.6* 105 52.4*  HCO3 32.1* 31.7* 31.6*  O2SAT 90.1 98.0 91.5    Liver Function Tests: No results for input(s): AST, ALT, ALKPHOS, BILITOT, PROT, ALBUMIN in the last 168 hours. No results for input(s): LIPASE, AMYLASE in the last 168 hours. No results for input(s): AMMONIA in the last 168  hours.  CBC: Recent Labs  Lab 12/08/19 0634 12/10/19 1135 12/11/19 0533  WBC 12.4* 12.1* 9.6  HGB 7.3* 6.9* 8.6*  HCT 24.2* 23.4* 27.8*  MCV 100.0 104.0* 101.1*  PLT 130* 156 154    Cardiac Enzymes: No results for input(s): CKTOTAL, CKMB, CKMBINDEX, TROPONINI in the last 168 hours.  BNP (last 3 results) No results for input(s): BNP in the last 8760 hours.  ProBNP (last 3 results) No results for input(s): PROBNP in the last 8760 hours.  Radiological Exams: CT ANGIO CHEST PE W OR WO CONTRAST  Result Date: 12/10/2019 CLINICAL DATA:  PE suspected EXAM: CT ANGIOGRAPHY CHEST WITH CONTRAST TECHNIQUE: Multidetector CT imaging of the chest was performed using the standard protocol during bolus administration of intravenous contrast. Multiplanar CT image reconstructions and MIPs were obtained to evaluate the vascular anatomy. CONTRAST:  12/12/2019 OMNIPAQUE IOHEXOL 350 MG/ML SOLN COMPARISON:  None. FINDINGS: Cardiovascular: Evaluation for pulmonary embolism is somewhat limited by breath motion artifact in lung bases, within this limitation, no evidence of pulmonary embolism through the segmental pulmonary arterial level. Mild cardiomegaly. No pericardial effusion. Enlargement of the main pulmonary artery measuring up to 4.0 cm in caliber. Mediastinum/Nodes: Numerous prominent mediastinal and hilar lymph nodes. Tracheostomy. Thyroid gland and esophagus demonstrate no significant findings. Lungs/Pleura: There is severe underlying pulmonary fibrosis with traction bronchiectasis and areas of bronchiolectasis,  not well evaluated due to acutely superimposed airspace disease. There is diffusely superimposed ground-glass opacity and areas of heterogeneous airspace opacity. Small bilateral pleural effusions. Upper Abdomen: No acute abnormality. Musculoskeletal: No chest wall abnormality. No acute or significant osseous findings. Review of the MIP images confirms the above findings. IMPRESSION: 1. Evaluation for  pulmonary embolism is somewhat limited by breath motion artifact in lung bases, within this limitation, no evidence of pulmonary embolism through the segmental pulmonary arterial level. 2. There is severe underlying pulmonary fibrosis with traction bronchiectasis and areas of bronchiolectasis, not well evaluated due to acutely superimposed airspace disease. 3. There is diffusely superimposed ground-glass opacity and areas of heterogeneous airspace opacity. Small bilateral pleural effusions. Findings are consistent with multifocal infection or inflammation. 4. Numerous prominent mediastinal and hilar lymph nodes, likely reactive. 5. Enlargement of the main pulmonary artery, as can be seen in pulmonary hypertension. 6. Cardiomegaly. 7. Tracheostomy. Electronically Signed   By: Lauralyn Primes M.D.   On: 12/10/2019 16:34    Assessment/Plan Active Problems:   Acute on chronic respiratory failure with hypoxia (HCC)   COVID-19 virus infection   Acute respiratory distress syndrome (ARDS) due to COVID-19 virus (HCC)   Pneumonia due to COVID-19 virus   Acute renal failure due to tubular necrosis (HCC)   1. Acute on chronic respiratory failure with hypoxia patient is currently at baseline on 50% FiO2 on pressure control mode we will continue supportive measures and pulmonary toilet at this time. 2. COVID-19 virus infection in resolution still with significant airspace disease 3. ARDS oxygenation has improved since increasing PEEP will wean 4. Pneumonia due to COVID-19 following x-rays 5. Acute renal failure improved   I have personally seen and evaluated the patient, evaluated laboratory and imaging results, formulated the assessment and plan and placed orders. The Patient requires high complexity decision making with multiple systems involvement.  Rounds were done with the Respiratory Therapy Director and Staff therapists and discussed with nursing staff also.  Yevonne Pax, MD [facility] Pulmonary Critical  Care Medicine Sleep Medicine

## 2019-12-12 DIAGNOSIS — N170 Acute kidney failure with tubular necrosis: Secondary | ICD-10-CM | POA: Diagnosis not present

## 2019-12-12 DIAGNOSIS — J9621 Acute and chronic respiratory failure with hypoxia: Secondary | ICD-10-CM | POA: Diagnosis not present

## 2019-12-12 DIAGNOSIS — J80 Acute respiratory distress syndrome: Secondary | ICD-10-CM | POA: Diagnosis not present

## 2019-12-12 DIAGNOSIS — U071 COVID-19: Secondary | ICD-10-CM | POA: Diagnosis not present

## 2019-12-12 NOTE — Progress Notes (Addendum)
Pulmonary Critical Care Medicine [facility] [place]   PULMONARY CRITICAL CARE SERVICE  PROGRESS NOTE  Date of Service: 12/12/2019  Danny Leach  HUD:[id number]  DOB: April 01, 1966   DOA: 12/24/2019  Referring Physician: Carron Curie, MD  HPI: Danny Leach is a 54 y.o. male seen for follow up of Acute on Chronic Respiratory Failure. Pt remains on full support with rate of 30, and fio2 50%.    Medications: Reviewed on Rounds  Physical Exam:  Vitals: Pulse 85, resp 35, bp 130/65, o2 sat 92%, temp 96.5  Ventilator Settings Ventilator mode ACPC resp 30, ip 24, peep of 10, fio2 of 50%.   . General: Comfortable at this time . Eyes: Grossly normal lids, irises & conjunctiva . ENT: grossly tongue is normal . Neck: no obvious mass . Cardiovascular: S1 S2 normal no gallop . Respiratory: no rales or ronchi noted . Abdomen: soft . Skin: no rash seen on limited exam . Musculoskeletal: not rigid . Psychiatric:unable to assess . Neurologic: no seizure no involuntary movements         Lab Data:   Basic Metabolic Panel: Recent Labs  Lab 12/08/19 0634 12/08/19 0956 12/10/19 1135 12/11/19 0533 12/11/19 1727  NA 144 144 146* 147* 146*  K 4.1 4.3 4.0 4.4 4.3  CL 108 106 109 110 108  CO2 28 31 30  32 31  GLUCOSE 135* 134* 129* 112* 284*  BUN 71* 73* 82* 75* 74*  CREATININE 1.34* 1.36* 1.34* 1.30* 1.39*  CALCIUM 7.9* 8.1* 8.2* 8.2* 7.6*  MG 2.1  --  2.2  --   --     ABG: Recent Labs  Lab 12/08/19 1204 12/08/19 1814 12/09/19 0358  PHART 7.121* 7.151* 7.323*  PCO2ART 103* 94.0* 62.7*  PO2ART 64.6* 105 52.4*  HCO3 32.1* 31.7* 31.6*  O2SAT 90.1 98.0 91.5    Liver Function Tests: No results for input(s): AST, ALT, ALKPHOS, BILITOT, PROT, ALBUMIN in the last 168 hours. No results for input(s): LIPASE, AMYLASE in the last 168 hours. No results for input(s): AMMONIA in the last 168 hours.  CBC: Recent Labs  Lab 12/08/19 0634 12/10/19 1135 12/11/19 0533   WBC 12.4* 12.1* 9.6  HGB 7.3* 6.9* 8.6*  HCT 24.2* 23.4* 27.8*  MCV 100.0 104.0* 101.1*  PLT 130* 156 154    Cardiac Enzymes: No results for input(s): CKTOTAL, CKMB, CKMBINDEX, TROPONINI in the last 168 hours.  BNP (last 3 results) No results for input(s): BNP in the last 8760 hours.  ProBNP (last 3 results) No results for input(s): PROBNP in the last 8760 hours.  Radiological Exams: No results found.  Assessment/Plan Active Problems:   Acute on chronic respiratory failure with hypoxia (HCC)   COVID-19 virus infection   Acute respiratory distress syndrome (ARDS) due to COVID-19 virus (HCC)   Pneumonia due to COVID-19 virus   Acute renal failure due to tubular necrosis (HCC)   1. Acute on chronic respiratory failure with hypoxia patient mains at 50% FiO2 on pressure control mode currently on PEEP of 10.  Continue to attempt weaning as tolerated.  Continue aggressive pulmonary toilet supportive measures. 2. COVID-19 virus infection in resolution still with significant airspace disease 3. ARDS oxygenation has improved since increasing PEEP will wean 4. Pneumonia due to COVID-19 following x-rays 5. Acute renal failure improved   I have personally seen and evaluated the patient, evaluated laboratory and imaging results, formulated the assessment and plan and placed orders. The Patient requires high complexity decision making with  multiple systems involvement.  Rounds were done with the Respiratory Therapy Director and Staff therapists and discussed with nursing staff also.  Allyne Gee, MD [facility] Pulmonary Critical Care Medicine Sleep Medicine

## 2019-12-12 NOTE — Progress Notes (Signed)
PROGRESS NOTE    Danny Leach  RUE:[id number] DOB: [birth year] DOA: 2019-12-27   Brief Narrative:  Danny Leach is an 54 y.o. male his medical history significant of hypertension, hyperlipidemia who was diagnosed with COVID-19 infection 10/07/2019.  He was at home until 10/17/2019 when he started developing worsening shortness of breath, dizziness, increased work of breathing.  Patient presented to the acute facility and was initially admitted to the medical ICU.  CT scan showed multifocal pneumonia with bilateral mediastinal hilar lymphadenopathy.  He was given Tocilizumab x1 on 10/18/2019 and completed 15-day extended course of Decadron.  He was also diuresed.  Patient continued to have hypoxemia and was given Solu-Medrol as well.  On 11/06/2019 chest x-ray showed pulmonary edema and he was diuresed with some improvement.  On 11/06/2019 patient unfortunately desaturated into the 60s and was subsequently intubated and received multiple sedative drips for ventilator synchrony.  His hospital course was complicated with acute kidney injury at the outside facility.  On 11/17/2018 when he was started on empiric IV vancomycin and cefepime for pneumonia and he completed 7-day course of antibiotics.  His respiratory cultures after that were negative.  On 11/22/2018 when he underwent tracheostomy and PEG tube placement.  He unfortunately had encephalopathy.  However, head CT did not show any acute findings. He was transferred to [facility] and was admitted here on 12/27/19 on prophylactic Bactrim and prednisone taper.  After admission here patient was found to be acidotic. Pulmonary has been following. 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  Patient started having high fevers with temperature of 101 on 12/04/2019.  He was started on empiric IV vancomycin, Zosyn.  In the interim he also received treatment with Levaquin.  Blood cultures from 12/04/2019 did not show any growth  in 4 days.  Tracheal aspirate cultures consistent with normal respiratory flora.    Assessment & Plan:   Active Problems:   Acute on chronic respiratory failure with hypoxia (HCC)   COVID-19 virus infection   Acute respiratory distress syndrome (ARDS) due to COVID-19 virus (HCC)   Pneumonia due to COVID-19 virus   Acute renal failure due to tubular necrosis (HCC) Systemic inflammatory response syndrome Leukocytosis Encephalopathy Sacrococcygeal pressure ulcer unstageable Dysphagia/malnutrition  Acute on chronic respiratory failure with hypoxemia: Initially had COVID-19 infection.  He subsequently developed ARDS.  He status post treatment with Tocilizumab and Decadron at the outside facility.    Here treated with hydroxyurea, folic acid, prednisone.  Remains on the ventilator on 50% FiO2, PEEP 10.  Respiratory culture showed normal flora which is highly concerning for ongoing aspiration.  CT chest on 12/10/2019 per report severe underlying pulmonary fibrosis with traction bronchiectasis and areas of bronchiolectasis, diffusely superimposed groundglass opacity with areas of heterogeneous airspace opacity.  On treatment with IV vancomycin, Zosyn with tentative end date 12/14/2019.  Pulmonary following. However, the fact that the respiratory cultures are showing normal flora, this likely is highly suspicious for aspiration which is contributing to the worsening respiratory failure. Unfortunately, if he has ongoing aspiration then he is at risk for worsening pneumonia despite being on antibiotics.  Pneumonia: Patient initially had COVID-19 infection with pneumonia. Remains on the ventilator.  He is at risk for ventilator associated pneumonia. He is already received treatment with Tocilizumab, Decadron for the COVID-19 infection.  Here treated with hydroxyurea, folic acid.  He is also on prednisone.  Started on empiric IV antibiotics for concern for bacterial pneumonia with IV vancomycin, Zosyn.  Plan as  mentioned above.  Again, as mentioned above respiratory cultures are showed normal respiratory flora which is concerning for aspiration in which case he is high risk for worsening pneumonia despite being on antibiotics.  For now plan to treat for tentative duration of 1 week pending improvement. -If his respiratory status is not improving even after he completes the IV vancomycin, Zosyn consider starting him on Unasyn for likely aspiration pneumonia.  Acute renal failure: Likely secondary to ATN.  Nephrology consulted and following.  Please monitor BUN/cr closely while on antibiotics.  Avoid nephrotoxic medications.  Encephalopathy: Patient started on acyclovir for suspicion of herpes encephalitis.  Head CT at the acute facility did not show any acute findings.  Tentative end date for the acyclovir is 12/13/2019.  Please monitor BUN/trending closely while on the acyclovir.  Continue supportive management per the primary team.  COVID-19 infection: He already received treatment with Tocilizumab and Decadron as mentioned above.  Currently on prednisone.  Patient also on prophylactic Bactrim while he is on the prednisone taper provided his BUN/creatinine remain stable.  If any worsening of his BUN and creatinine consider discontinuing the Bactrim while on the other antibiotics. Once he is done with the prednisone taper would recommend to discontinue the Bactrim.  Systemic inflammatory response syndrome with leukocytosis: On empiric antibiotics as mentioned above.  Blood cultures do not show any growth to date.  Continue to monitor.  Sacrococcygeal pressure ulcer unstageable: Continue local wound care.  Due to his debility and immobility he is very high risk for worsening of the wound.  If the wound is worsening consider consulting surgery for evaluation.  Dysphagia/malnutrition: Management per primary team.  Due to dysphagia he is at risk for aspiration and worsening facial pain secondary to aspiration  pneumonia.  Due to his complex medical problems he is very high risk for worsening and decompensation.   Plan of care discussed with the primary team and pharmacy.   Subjective: Remains encephalopathic.  Objective: Vitals: Temperature 97.4, pulse 86, respiratory rate 30, blood pressure 149/80, pulse oximetry 98% on 50% FiO2 Examination: Constitutional: Ill-appearing male, encephalopathic Head: Atraumatic, normocephalic Eyes: PERLA ENMT: external ears and nose appear normal, Lips appears normal, poor dentition  Neck: has trach in place CVS: S1-S2 Respiratory: Rhonchi, no wheezing Abdomen: soft, nondistended, positive bowel sounds Musculoskeletal: Mild bilateral lower extremity edema Neuro: Patient with trach on the vent, not following commands.  Unable to do neurologic exam at this time. Psych: Unable to assess Skin: Unstageable sacrococcygeal pressure ulcer, no rashes or lesions     Data Reviewed: I have personally reviewed following labs and imaging studies  CBC: Recent Labs  Lab 12/08/19 0634 12/10/19 1135 12/11/19 0533  WBC 12.4* 12.1* 9.6  HGB 7.3* 6.9* 8.6*  HCT 24.2* 23.4* 27.8*  MCV 100.0 104.0* 101.1*  PLT 130* 156 154    Basic Metabolic Panel: Recent Labs  Lab 12/08/19 0634 12/08/19 0956 12/10/19 1135 12/11/19 0533 12/11/19 1727  NA 144 144 146* 147* 146*  K 4.1 4.3 4.0 4.4 4.3  CL 108 106 109 110 108  CO2 28 31 30  32 31  GLUCOSE 135* 134* 129* 112* 284*  BUN 71* 73* 82* 75* 74*  CREATININE 1.34* 1.36* 1.34* 1.30* 1.39*  CALCIUM 7.9* 8.1* 8.2* 8.2* 7.6*  MG 2.1  --  2.2  --   --     GFR: CrCl cannot be calculated (Unknown ideal weight.).  Liver Function Tests: No results for input(s): AST, ALT, ALKPHOS, BILITOT,  PROT, ALBUMIN in the last 168 hours.  CBG: No results for input(s): GLUCAP in the last 168 hours.   Recent Results (from the past 240 hour(s))  Culture, respiratory (non-expectorated)     Status: None   Collection Time:  12/03/19  1:45 PM   Specimen: Tracheal Aspirate; Respiratory  Result Value Ref Range Status   Specimen Description TRACHEAL ASPIRATE  Final   Special Requests NONE  Final   Gram Stain   Final    RARE WBC PRESENT,BOTH PMN AND MONONUCLEAR RARE GRAM POSITIVE COCCI RARE YEAST RARE GRAM POSITIVE RODS Performed at [facility], [address], [place], [place] [address]    Culture FEW CANDIDA TROPICALIS  Final   Report Status 12/06/2019 FINAL  Final  Culture, respiratory (non-expectorated)     Status: None   Collection Time: 12/04/19  1:53 PM   Specimen: Tracheal Aspirate; Respiratory  Result Value Ref Range Status   Specimen Description TRACHEAL ASPIRATE  Final   Special Requests NONE  Final   Gram Stain   Final    RARE WBC PRESENT, PREDOMINANTLY PMN FEW GRAM POSITIVE COCCI IN PAIRS IN CLUSTERS RARE GRAM NEGATIVE RODS    Culture   Final    Consistent with normal respiratory flora. Performed at [facility], [address], [place], [place] [address]    Report Status 12/06/2019 FINAL  Final  Culture, blood (routine x 2)     Status: None   Collection Time: 12/04/19  2:26 PM   Specimen: BLOOD LEFT HAND  Result Value Ref Range Status   Specimen Description BLOOD LEFT HAND  Final   Special Requests   Final    BOTTLES DRAWN AEROBIC ONLY Blood Culture results may not be optimal due to an inadequate volume of blood received in culture bottles   Culture   Final    NO GROWTH 5 DAYS Performed at [facility], [address], [place], [place] [address]    Report Status 12/09/2019 FINAL  Final  Culture, blood (routine x 2)     Status: None   Collection Time: 12/04/19  2:26 PM   Specimen: BLOOD LEFT HAND  Result Value Ref Range Status   Specimen Description BLOOD LEFT HAND  Final   Special Requests   Final    BOTTLES DRAWN AEROBIC ONLY Blood Culture results may not be optimal due to an inadequate volume of blood received in culture bottles   Culture   Final    NO  GROWTH 5 DAYS Performed at [facility], [address] [place], [place] [address]    Report Status 12/09/2019 FINAL  Final     Radiology Studies: CT ANGIO CHEST PE W OR WO CONTRAST  Result Date: 12/10/2019 CLINICAL DATA:  PE suspected EXAM: CT ANGIOGRAPHY CHEST WITH CONTRAST TECHNIQUE: Multidetector CT imaging of the chest was performed using the standard protocol during bolus administration of intravenous contrast. Multiplanar CT image reconstructions and MIPs were obtained to evaluate the vascular anatomy. CONTRAST:  OMNIPAQUE IOHEXOL 350 MG/ML SOLN COMPARISON:  None. FINDINGS: Cardiovascular: Evaluation for pulmonary embolism is somewhat limited by breath motion artifact in lung bases, within this limitation, no evidence of pulmonary embolism through the segmental pulmonary arterial level. Mild cardiomegaly. No pericardial effusion. Enlargement of the main pulmonary artery measuring up to 4.0 cm in caliber. Mediastinum/Nodes: Numerous prominent mediastinal and hilar lymph nodes. Tracheostomy. Thyroid gland and esophagus demonstrate no significant findings. Lungs/Pleura: There is severe underlying pulmonary fibrosis  with traction bronchiectasis and areas of bronchiolectasis, not well evaluated due to acutely superimposed airspace disease. There is diffusely superimposed ground-glass opacity and areas of heterogeneous airspace opacity. Small bilateral pleural effusions. Upper Abdomen: No acute abnormality. Musculoskeletal: No chest wall abnormality. No acute or significant osseous findings. Review of the MIP images confirms the above findings. IMPRESSION: 1. Evaluation for pulmonary embolism is somewhat limited by breath motion artifact in lung bases, within this limitation, no evidence of pulmonary embolism through the segmental pulmonary arterial level. 2. There is severe underlying pulmonary fibrosis with traction bronchiectasis and areas of bronchiolectasis, not well evaluated due to  acutely superimposed airspace disease. 3. There is diffusely superimposed ground-glass opacity and areas of heterogeneous airspace opacity. Small bilateral pleural effusions. Findings are consistent with multifocal infection or inflammation. 4. Numerous prominent mediastinal and hilar lymph nodes, likely reactive. 5. Enlargement of the main pulmonary artery, as can be seen in pulmonary hypertension. 6. Cardiomegaly. 7. Tracheostomy. Electronically Signed   By: Lauralyn PrimesAlex  Bibbey M.D.   On: 12/10/2019 16:34    Scheduled Meds: Please see MAR   Vonzella NippleAnupama Jacqlyn Marolf, MD  12/12/2019, 3:49 PM

## 2019-12-13 DIAGNOSIS — J80 Acute respiratory distress syndrome: Secondary | ICD-10-CM | POA: Diagnosis not present

## 2019-12-13 DIAGNOSIS — J9621 Acute and chronic respiratory failure with hypoxia: Secondary | ICD-10-CM | POA: Diagnosis not present

## 2019-12-13 DIAGNOSIS — U071 COVID-19: Secondary | ICD-10-CM | POA: Diagnosis not present

## 2019-12-13 DIAGNOSIS — N170 Acute kidney failure with tubular necrosis: Secondary | ICD-10-CM | POA: Diagnosis not present

## 2019-12-13 LAB — BASIC METABOLIC PANEL
Anion gap: 6 (ref 5–15)
BUN: 75 mg/dL — ABNORMAL HIGH (ref 6–20)
CO2: 31 mmol/L (ref 22–32)
Calcium: 8.3 mg/dL — ABNORMAL LOW (ref 8.9–10.3)
Chloride: 108 mmol/L (ref 98–111)
Creatinine, Ser: 1.42 mg/dL — ABNORMAL HIGH (ref 0.61–1.24)
GFR calc Af Amer: >60 mL/min (ref >60)
GFR calc non Af Amer: 55 mL/min — ABNORMAL LOW (ref >60)
Glucose, Bld: 110 mg/dL — ABNORMAL HIGH (ref 70–99)
Potassium: 3.7 mmol/L (ref 3.5–5.1)
Sodium: 145 mmol/L (ref 135–145)

## 2019-12-13 LAB — CBC
HCT: 23.6 % — ABNORMAL LOW (ref 39.0–52.0)
Hemoglobin: 7.3 g/dL — ABNORMAL LOW (ref 13.0–17.0)
MCH: 30.8 pg (ref 26.0–34.0)
MCHC: 30.9 g/dL (ref 30.0–36.0)
MCV: 99.6 fL (ref 80.0–100.0)
Platelets: 152 10*3/uL (ref 150–400)
RBC: 2.37 MIL/uL — ABNORMAL LOW (ref 4.22–5.81)
RDW: 20.7 % — ABNORMAL HIGH (ref 11.5–15.5)
WBC: 9.7 10*3/uL (ref 4.0–10.5)
nRBC: 0.4 % — ABNORMAL HIGH (ref 0.0–0.2)

## 2019-12-14 DIAGNOSIS — J80 Acute respiratory distress syndrome: Secondary | ICD-10-CM | POA: Diagnosis not present

## 2019-12-14 DIAGNOSIS — N170 Acute kidney failure with tubular necrosis: Secondary | ICD-10-CM | POA: Diagnosis not present

## 2019-12-14 DIAGNOSIS — U071 COVID-19: Secondary | ICD-10-CM | POA: Diagnosis not present

## 2019-12-14 DIAGNOSIS — J9621 Acute and chronic respiratory failure with hypoxia: Secondary | ICD-10-CM | POA: Diagnosis not present

## 2019-12-14 LAB — BASIC METABOLIC PANEL
Anion gap: 8 (ref 5–15)
BUN: 76 mg/dL — ABNORMAL HIGH (ref 6–20)
CO2: 29 mmol/L (ref 22–32)
Calcium: 8.4 mg/dL — ABNORMAL LOW (ref 8.9–10.3)
Chloride: 108 mmol/L (ref 98–111)
Creatinine, Ser: 1.28 mg/dL — ABNORMAL HIGH (ref 0.61–1.24)
GFR calc Af Amer: >60 mL/min (ref >60)
GFR calc non Af Amer: >60 mL/min (ref >60)
Glucose, Bld: 147 mg/dL — ABNORMAL HIGH (ref 70–99)
Potassium: 5.1 mmol/L (ref 3.5–5.1)
Sodium: 145 mmol/L (ref 135–145)

## 2019-12-14 NOTE — Progress Notes (Addendum)
Pulmonary Critical Care Medicine [facility] [place]   PULMONARY CRITICAL CARE SERVICE  PROGRESS NOTE  Date of Service: 12/13/2019  Candice Lunney  SNK:[id number]  DOB: 22-Jun-1965   DOA: 12-07-2019  Referring Physician: Carron Curie, MD  HPI: Dereke Neumann is a 54 y.o. male seen for follow up of Acute on Chronic Respiratory Failure.  Patient remains on full support on the ventilator currently on pressure control rate 35 and FiO2 of 50% stress.  Medications: Reviewed on Rounds  Physical Exam:  Vitals: Pulse 97 respirations 30 BP 129/73 O2 sat 94% temp 98.4  Ventilator Settings ventilator mode pressure control rate 30 inspiratory pressure of 24 PEEP of 8 with an FiO2 to 50%  . General: Comfortable at this time . Eyes: Grossly normal lids, irises & conjunctiva . ENT: grossly tongue is normal . Neck: no obvious mass . Cardiovascular: S1 S2 normal no gallop . Respiratory: No rales or rhonchi noted . Abdomen: soft . Skin: no rash seen on limited exam . Musculoskeletal: not rigid . Psychiatric:unable to assess . Neurologic: no seizure no involuntary movements         Lab Data:   Basic Metabolic Panel: Recent Labs  Lab 12/08/19 0634 12/08/19 0956 12/10/19 1135 12/11/19 0533 12/11/19 1727 12/13/19 0500 12/14/19 1118  NA 144   < > 146* 147* 146* 145 145  K 4.1   < > 4.0 4.4 4.3 3.7 5.1  CL 108   < > 109 110 108 108 108  CO2 28   < > 30 32 31 31 29   GLUCOSE 135*   < > 129* 112* 284* 110* 147*  BUN 71*   < > 82* 75* 74* 75* 76*  CREATININE 1.34*   < > 1.34* 1.30* 1.39* 1.42* 1.28*  CALCIUM 7.9*   < > 8.2* 8.2* 7.6* 8.3* 8.4*  MG 2.1  --  2.2  --   --   --   --    < > = values in this interval not displayed.    ABG: Recent Labs  Lab 12/08/19 1204 12/08/19 1814 12/09/19 0358  PHART 7.121* 7.151* 7.323*  PCO2ART 103* 94.0* 62.7*  PO2ART 64.6* 105 52.4*  HCO3 32.1* 31.7* 31.6*  O2SAT 90.1 98.0 91.5    Liver Function Tests: No results for  input(s): AST, ALT, ALKPHOS, BILITOT, PROT, ALBUMIN in the last 168 hours. No results for input(s): LIPASE, AMYLASE in the last 168 hours. No results for input(s): AMMONIA in the last 168 hours.  CBC: Recent Labs  Lab 12/08/19 0634 12/10/19 1135 12/11/19 0533 12/13/19 0500  WBC 12.4* 12.1* 9.6 9.7  HGB 7.3* 6.9* 8.6* 7.3*  HCT 24.2* 23.4* 27.8* 23.6*  MCV 100.0 104.0* 101.1* 99.6  PLT 130* 156 154 152    Cardiac Enzymes: No results for input(s): CKTOTAL, CKMB, CKMBINDEX, TROPONINI in the last 168 hours.  BNP (last 3 results) No results for input(s): BNP in the last 8760 hours.  ProBNP (last 3 results) No results for input(s): PROBNP in the last 8760 hours.  Radiological Exams: No results found.  Assessment/Plan Active Problems:   Acute on chronic respiratory failure with hypoxia (HCC)   COVID-19 virus infection   Acute respiratory distress syndrome (ARDS) due to COVID-19 virus (HCC)   Pneumonia due to COVID-19 virus   Acute renal failure due to tubular necrosis (HCC)   1. Acute on chronic respiratory failure with hypoxiapatient mains on full support pressure control mode currently with an FiO2 of 50% satting  well at this time we will continue aggressive pulmonary toilet 2. COVID-19 virus infection in resolution still with significant airspace disease 3. ARDS oxygenation has improved since increasing PEEP will wean 4. Pneumonia due to COVID-19 following x-rays 5. Acute renal failure improved   I have personally seen and evaluated the patient, evaluated laboratory and imaging results, formulated the assessment and plan and placed orders. The Patient requires high complexity decision making with multiple systems involvement.  Rounds were done with the Respiratory Therapy Director and Staff therapists and discussed with nursing staff also.  Yevonne Pax, MD [facility] Pulmonary Critical Care Medicine Sleep Medicine

## 2019-12-14 NOTE — Progress Notes (Addendum)
Pulmonary Critical Care Medicine [facility] [place]   PULMONARY CRITICAL CARE SERVICE  PROGRESS NOTE  Date of Service: 12/14/2019  Danny Leach  ZOX:[id number]  DOB: 07-19-1965   DOA: 11/27/2019  Referring Physician: Carron Curie, MD  HPI: Danny Leach is a 54 y.o. male seen for follow up of Acute on Chronic Respiratory Failure.  Patient will continue on full support pressure control mode rate of 30 with FiO2 of 60%  Medications: Reviewed on Rounds  Physical Exam:  Vitals: Pulse 97 respirations 30 BP 149/78 O2 sat 94% temp 98.2  Ventilator Settings: Mode AC PC rate 30 inspiratory pressure of 24 PEEP of 8 with an FiO2 of 60%  . General: Comfortable at this time . Eyes: Grossly normal lids, irises & conjunctiva . ENT: grossly tongue is normal . Neck: no obvious mass . Cardiovascular: S1 S2 normal no gallop . Respiratory: No rales or rhonchi noted . Abdomen: soft . Skin: no rash seen on limited exam . Musculoskeletal: not rigid . Psychiatric:unable to assess . Neurologic: no seizure no involuntary movements         Lab Data:   Basic Metabolic Panel: Recent Labs  Lab 12/08/19 0634 12/08/19 0956 12/10/19 1135 12/11/19 0533 12/11/19 1727 12/13/19 0500 12/14/19 1118  NA 144   < > 146* 147* 146* 145 145  K 4.1   < > 4.0 4.4 4.3 3.7 5.1  CL 108   < > 109 110 108 108 108  CO2 28   < > 30 32 31 31 29   GLUCOSE 135*   < > 129* 112* 284* 110* 147*  BUN 71*   < > 82* 75* 74* 75* 76*  CREATININE 1.34*   < > 1.34* 1.30* 1.39* 1.42* 1.28*  CALCIUM 7.9*   < > 8.2* 8.2* 7.6* 8.3* 8.4*  MG 2.1  --  2.2  --   --   --   --    < > = values in this interval not displayed.    ABG: Recent Labs  Lab 12/08/19 1204 12/08/19 1814 12/09/19 0358  PHART 7.121* 7.151* 7.323*  PCO2ART 103* 94.0* 62.7*  PO2ART 64.6* 105 52.4*  HCO3 32.1* 31.7* 31.6*  O2SAT 90.1 98.0 91.5    Liver Function Tests: No results for input(s): AST, ALT, ALKPHOS, BILITOT, PROT,  ALBUMIN in the last 168 hours. No results for input(s): LIPASE, AMYLASE in the last 168 hours. No results for input(s): AMMONIA in the last 168 hours.  CBC: Recent Labs  Lab 12/08/19 0634 12/10/19 1135 12/11/19 0533 12/13/19 0500  WBC 12.4* 12.1* 9.6 9.7  HGB 7.3* 6.9* 8.6* 7.3*  HCT 24.2* 23.4* 27.8* 23.6*  MCV 100.0 104.0* 101.1* 99.6  PLT 130* 156 154 152    Cardiac Enzymes: No results for input(s): CKTOTAL, CKMB, CKMBINDEX, TROPONINI in the last 168 hours.  BNP (last 3 results) No results for input(s): BNP in the last 8760 hours.  ProBNP (last 3 results) No results for input(s): PROBNP in the last 8760 hours.  Radiological Exams: No results found.  Assessment/Plan Active Problems:   Acute on chronic respiratory failure with hypoxia (HCC)   COVID-19 virus infection   Acute respiratory distress syndrome (ARDS) due to COVID-19 virus (HCC)   Pneumonia due to COVID-19 virus   Acute renal failure due to tubular necrosis (HCC)   1. Acute on chronic respiratory failure with hypoxiapatient mains at 60% FiO2 on pressure control mode currently on PEEP of 8. Continue to attempt weaning as  tolerated. Continue aggressive pulmonary toilet supportive measures. 2. COVID-19 virus infection in resolution still with significant airspace disease 3. ARDS oxygenation has improved since increasing PEEP will wean 4. Pneumonia due to COVID-19 following x-rays 5. Acute renal failure improved   I have personally seen and evaluated the patient, evaluated laboratory and imaging results, formulated the assessment and plan and placed orders. The Patient requires high complexity decision making with multiple systems involvement.  Rounds were done with the Respiratory Therapy Director and Staff therapists and discussed with nursing staff also.  Yevonne Pax, MD [facility] Pulmonary Critical Care Medicine Sleep Medicine

## 2019-12-15 DIAGNOSIS — U071 COVID-19: Secondary | ICD-10-CM | POA: Diagnosis not present

## 2019-12-15 DIAGNOSIS — J9621 Acute and chronic respiratory failure with hypoxia: Secondary | ICD-10-CM | POA: Diagnosis not present

## 2019-12-15 DIAGNOSIS — J80 Acute respiratory distress syndrome: Secondary | ICD-10-CM | POA: Diagnosis not present

## 2019-12-15 DIAGNOSIS — N170 Acute kidney failure with tubular necrosis: Secondary | ICD-10-CM | POA: Diagnosis not present

## 2019-12-15 LAB — BLOOD GAS, ARTERIAL
Acid-Base Excess: 7.7 mmol/L — ABNORMAL HIGH (ref 0.0–2.0)
Bicarbonate: 34.1 mmol/L — ABNORMAL HIGH (ref 20.0–28.0)
FIO2: 60
O2 Saturation: 94.2 %
Patient temperature: 36.6
pCO2 arterial: 71.5 mmHg (ref 32.0–48.0)
pH, Arterial: 7.297 — ABNORMAL LOW (ref 7.350–7.450)
pO2, Arterial: 64.4 mmHg — ABNORMAL LOW (ref 83.0–108.0)

## 2019-12-15 LAB — CULTURE, RESPIRATORY W GRAM STAIN: Culture: NORMAL

## 2019-12-15 NOTE — Progress Notes (Addendum)
Pulmonary Critical Care Medicine [facility] [place]   PULMONARY CRITICAL CARE SERVICE  PROGRESS NOTE  Date of Service: 12/15/2019  Danny Leach  OZH:[id number]  DOB: [birth year]   DOA: 12/05/2019  Referring Physician: Carron Curie, MD  HPI: Danny Leach is a 54 y.o. male seen for follow up of Acute on Chronic Respiratory Failure.  Patient remains on full support control mode rate of 30 FiO2 of 60% satting well no fever distress.  Medications: Reviewed on Rounds  Physical Exam:  Vitals: Pulse 89 respirations 31 BP 150/82 O2 sat 97% temp 96.7  Ventilator Settings ventilator mode AC PC rate of 30 inspiratory pressure 24 PEEP of 8 with FiO2 60%  . General: Comfortable at this time . Eyes: Grossly normal lids, irises & conjunctiva . ENT: grossly tongue is normal . Neck: no obvious mass . Cardiovascular: S1 S2 normal no gallop . Respiratory: No rales or rhonchi noted . Abdomen: soft . Skin: no rash seen on limited exam . Musculoskeletal: not rigid . Psychiatric:unable to assess . Neurologic: no seizure no involuntary movements         Lab Data:   Basic Metabolic Panel: Recent Labs  Lab 12/10/19 1135 12/11/19 0533 12/11/19 1727 12/13/19 0500 12/14/19 1118  NA 146* 147* 146* 145 145  K 4.0 4.4 4.3 3.7 5.1  CL 109 110 108 108 108  CO2 30 32 31 31 29   GLUCOSE 129* 112* 284* 110* 147*  BUN 82* 75* 74* 75* 76*  CREATININE 1.34* 1.30* 1.39* 1.42* 1.28*  CALCIUM 8.2* 8.2* 7.6* 8.3* 8.4*  MG 2.2  --   --   --   --     ABG: Recent Labs  Lab 12/09/19 0358 12/15/19 1610  PHART 7.323* 7.297*  PCO2ART 62.7* 71.5*  PO2ART 52.4* 64.4*  HCO3 31.6* 34.1*  O2SAT 91.5 94.2    Liver Function Tests: No results for input(s): AST, ALT, ALKPHOS, BILITOT, PROT, ALBUMIN in the last 168 hours. No results for input(s): LIPASE, AMYLASE in the last 168 hours. No results for input(s): AMMONIA in the last 168 hours.  CBC: Recent Labs  Lab 12/10/19 1135  12/11/19 0533 12/13/19 0500  WBC 12.1* 9.6 9.7  HGB 6.9* 8.6* 7.3*  HCT 23.4* 27.8* 23.6*  MCV 104.0* 101.1* 99.6  PLT 156 154 152    Cardiac Enzymes: No results for input(s): CKTOTAL, CKMB, CKMBINDEX, TROPONINI in the last 168 hours.  BNP (last 3 results) No results for input(s): BNP in the last 8760 hours.  ProBNP (last 3 results) No results for input(s): PROBNP in the last 8760 hours.  Radiological Exams: No results found.  Assessment/Plan Active Problems:   Acute on chronic respiratory failure with hypoxia (HCC)   COVID-19 virus infection   Acute respiratory distress syndrome (ARDS) due to COVID-19 virus (HCC)   Pneumonia due to COVID-19 virus   Acute renal failure due to tubular necrosis (HCC)   1. Acute on chronic respiratory failure with hypoxiapatient mains on full support pressure control mode currently on FiO2 of 60%.  Continue aggressive pulmonary toilet supportive measures and continue to attempt weaning as tolerated. 2. COVID-19 virus infection in resolution still with significant airspace disease 3. ARDS oxygenation has improved since increasing PEEP will wean 4. Pneumonia due to COVID-19 following x-rays 5. Acute renal failure improved   I have personally seen and evaluated the patient, evaluated laboratory and imaging results, formulated the assessment and plan and placed orders. The Patient requires high complexity decision making with  multiple systems involvement.  Rounds were done with the Respiratory Therapy Director and Staff therapists and discussed with nursing staff also.  Danny Gee, MD [facility] Pulmonary Critical Care Medicine Sleep Medicine

## 2019-12-16 DIAGNOSIS — J80 Acute respiratory distress syndrome: Secondary | ICD-10-CM | POA: Diagnosis not present

## 2019-12-16 DIAGNOSIS — N170 Acute kidney failure with tubular necrosis: Secondary | ICD-10-CM | POA: Diagnosis not present

## 2019-12-16 DIAGNOSIS — J9621 Acute and chronic respiratory failure with hypoxia: Secondary | ICD-10-CM | POA: Diagnosis not present

## 2019-12-16 DIAGNOSIS — U071 COVID-19: Secondary | ICD-10-CM | POA: Diagnosis not present

## 2019-12-16 LAB — BLOOD GAS, ARTERIAL
Acid-Base Excess: 8 mmol/L — ABNORMAL HIGH (ref 0.0–2.0)
Acid-Base Excess: 9 mmol/L — ABNORMAL HIGH (ref 0.0–2.0)
Bicarbonate: 35.4 mmol/L — ABNORMAL HIGH (ref 20.0–28.0)
Bicarbonate: 34.9 mmol/L — ABNORMAL HIGH (ref 20.0–28.0)
Drawn by: 243969
FIO2: 60
FIO2: 55
O2 Saturation: 98.6 %
O2 Saturation: 97.8 %
Patient temperature: 36.3
Patient temperature: 37
pCO2 arterial: 85.4 mmHg (ref 32.0–48.0)
pCO2 arterial: 68.5 mmHg (ref 32.0–48.0)
pH, Arterial: 7.236 — ABNORMAL LOW (ref 7.350–7.450)
pH, Arterial: 7.328 — ABNORMAL LOW (ref 7.350–7.450)
pO2, Arterial: 111 mmHg — ABNORMAL HIGH (ref 83.0–108.0)
pO2, Arterial: 84.7 mmHg (ref 83.0–108.0)

## 2019-12-16 NOTE — Progress Notes (Addendum)
Pulmonary Critical Care Medicine [facility] [place]   PULMONARY CRITICAL CARE SERVICE  PROGRESS NOTE  Date of Service: 12/16/2019  Danny Leach  FIE:[id number]  DOB: 16-Oct-1965   DOA: 11/21/2019  Referring Physician: Carron Curie, MD  HPI: Danny Leach is a 54 y.o. male seen for follow up of Acute on Chronic Respiratory Failure.  Patient remains on full support pressure control mode rate of 35 and FiO2 55% satting well at this time with no fever distress.  Medications: Reviewed on Rounds  Physical Exam:  Vitals: Pulse 88 respirations 30 BP 155/79 O2 sat 98% temp 96  Ventilator Settings ventilator mode AC PC rate of 35 respiratory pressure of 24 PEEP of 8 with an FiO2 of 55%  . General: Comfortable at this time . Eyes: Grossly normal lids, irises & conjunctiva . ENT: grossly tongue is normal . Neck: no obvious mass . Cardiovascular: S1 S2 normal no gallop . Respiratory: No rales or rhonchi noted . Abdomen: soft . Skin: no rash seen on limited exam . Musculoskeletal: not rigid . Psychiatric:unable to assess . Neurologic: no seizure no involuntary movements         Lab Data:   Basic Metabolic Panel: Recent Labs  Lab 12/10/19 1135 12/11/19 0533 12/11/19 1727 12/13/19 0500 12/14/19 1118  NA 146* 147* 146* 145 145  K 4.0 4.4 4.3 3.7 5.1  CL 109 110 108 108 108  CO2 30 32 31 31 29   GLUCOSE 129* 112* 284* 110* 147*  BUN 82* 75* 74* 75* 76*  CREATININE 1.34* 1.30* 1.39* 1.42* 1.28*  CALCIUM 8.2* 8.2* 7.6* 8.3* 8.4*  MG 2.2  --   --   --   --     ABG: Recent Labs  Lab 12/15/19 1610 12/16/19 0510 12/16/19 1016  PHART 7.297* 7.236* 7.328*  PCO2ART 71.5* 85.4* 68.5*  PO2ART 64.4* 111* 84.7  HCO3 34.1* 35.4* 34.9*  O2SAT 94.2 98.6 97.8    Liver Function Tests: No results for input(s): AST, ALT, ALKPHOS, BILITOT, PROT, ALBUMIN in the last 168 hours. No results for input(s): LIPASE, AMYLASE in the last 168 hours. No results for input(s):  AMMONIA in the last 168 hours.  CBC: Recent Labs  Lab 12/10/19 1135 12/11/19 0533 12/13/19 0500  WBC 12.1* 9.6 9.7  HGB 6.9* 8.6* 7.3*  HCT 23.4* 27.8* 23.6*  MCV 104.0* 101.1* 99.6  PLT 156 154 152    Cardiac Enzymes: No results for input(s): CKTOTAL, CKMB, CKMBINDEX, TROPONINI in the last 168 hours.  BNP (last 3 results) No results for input(s): BNP in the last 8760 hours.  ProBNP (last 3 results) No results for input(s): PROBNP in the last 8760 hours.  Radiological Exams: No results found.  Assessment/Plan Active Problems:   Acute on chronic respiratory failure with hypoxia (HCC)   COVID-19 virus infection   Acute respiratory distress syndrome (ARDS) due to COVID-19 virus (HCC)   Pneumonia due to COVID-19 virus   Acute renal failure due to tubular necrosis (HCC)   1. Acute on chronic respiratory failure with hypoxiapatient continue on pressure control mode with FiO2 55% we will continue to attempt weaning as tolerated.  Continue aggressive pulmonary toilet supportive measures.. 2. COVID-19 virus infection in resolution still with significant airspace disease 3. ARDS oxygenation has improved since increasing PEEP will wean 4. Pneumonia due to COVID-19 following x-rays 5. Acute renal failure improved   I have personally seen and evaluated the patient, evaluated laboratory and imaging results, formulated the assessment and  plan and placed orders. The Patient requires high complexity decision making with multiple systems involvement.  Rounds were done with the Respiratory Therapy Director and Staff therapists and discussed with nursing staff also.  Allyne Gee, MD [facility] Pulmonary Critical Care Medicine Sleep Medicine

## 2019-12-17 DIAGNOSIS — J9621 Acute and chronic respiratory failure with hypoxia: Secondary | ICD-10-CM | POA: Diagnosis not present

## 2019-12-17 DIAGNOSIS — U071 COVID-19: Secondary | ICD-10-CM | POA: Diagnosis not present

## 2019-12-17 DIAGNOSIS — N170 Acute kidney failure with tubular necrosis: Secondary | ICD-10-CM | POA: Diagnosis not present

## 2019-12-17 DIAGNOSIS — J80 Acute respiratory distress syndrome: Secondary | ICD-10-CM | POA: Diagnosis not present

## 2019-12-17 LAB — BASIC METABOLIC PANEL
Anion gap: 7 (ref 5–15)
BUN: 82 mg/dL — ABNORMAL HIGH (ref 6–20)
CO2: 34 mmol/L — ABNORMAL HIGH (ref 22–32)
Calcium: 8.7 mg/dL — ABNORMAL LOW (ref 8.9–10.3)
Chloride: 110 mmol/L (ref 98–111)
Creatinine, Ser: 1.08 mg/dL (ref 0.61–1.24)
GFR calc Af Amer: >60 mL/min (ref >60)
GFR calc non Af Amer: >60 mL/min (ref >60)
Glucose, Bld: 180 mg/dL — ABNORMAL HIGH (ref 70–99)
Potassium: 5.6 mmol/L — ABNORMAL HIGH (ref 3.5–5.1)
Sodium: 151 mmol/L — ABNORMAL HIGH (ref 135–145)

## 2019-12-17 NOTE — Progress Notes (Signed)
Pulmonary Critical Care Medicine [facility] [place]   PULMONARY CRITICAL CARE SERVICE  PROGRESS NOTE  Date of Service: 12/17/2019  Danny Leach  FYB:[id number]  DOB: 1965/07/14   DOA: 12/11/19  Referring Physician: Carron Curie, MD  HPI: Danny Leach is a 54 y.o. male seen for follow up of Acute on Chronic Respiratory Failure.  Patient is on full support on the ventilator has not been tolerating attempts at weaning currently is requiring 60% FiO2  Medications: Reviewed on Rounds  Physical Exam:  Vitals: Temperature 97.0 pulse 109 respiratory rate 35 blood pressure is 141/79 saturations 93%  Ventilator Settings on pressure assist control FiO2 60% tidal volume 600 IP 24 PEEP 8   General: Comfortable at this time  Eyes: Grossly normal lids, irises & conjunctiva  ENT: grossly tongue is normal  Neck: no obvious mass  Cardiovascular: S1 S2 normal no gallop  Respiratory: Coarse rhonchi expansion is equal  Abdomen: soft  Skin: no rash seen on limited exam  Musculoskeletal: not rigid  Psychiatric:unable to assess  Neurologic: no seizure no involuntary movements         Lab Data:   Basic Metabolic Panel: Recent Labs  Lab 12/11/19 0533 12/11/19 1727 12/13/19 0500 12/14/19 1118 12/17/19 1637  NA 147* 146* 145 145 151*  K 4.4 4.3 3.7 5.1 5.6*  CL 110 108 108 108 110  CO2 32 31 31 29  34*  GLUCOSE 112* 284* 110* 147* 180*  BUN 75* 74* 75* 76* 82*  CREATININE 1.30* 1.39* 1.42* 1.28* 1.08  CALCIUM 8.2* 7.6* 8.3* 8.4* 8.7*    ABG: Recent Labs  Lab 12/15/19 1610 12/16/19 0510 12/16/19 1016  PHART 7.297* 7.236* 7.328*  PCO2ART 71.5* 85.4* 68.5*  PO2ART 64.4* 111* 84.7  HCO3 34.1* 35.4* 34.9*  O2SAT 94.2 98.6 97.8    Liver Function Tests: No results for input(s): AST, ALT, ALKPHOS, BILITOT, PROT, ALBUMIN in the last 168 hours. No results for input(s): LIPASE, AMYLASE in the last 168 hours. No results for input(s): AMMONIA in the last  168 hours.  CBC: Recent Labs  Lab 12/11/19 0533 12/13/19 0500  WBC 9.6 9.7  HGB 8.6* 7.3*  HCT 27.8* 23.6*  MCV 101.1* 99.6  PLT 154 152    Cardiac Enzymes: No results for input(s): CKTOTAL, CKMB, CKMBINDEX, TROPONINI in the last 168 hours.  BNP (last 3 results) No results for input(s): BNP in the last 8760 hours.  ProBNP (last 3 results) No results for input(s): PROBNP in the last 8760 hours.  Radiological Exams: No results found.  Assessment/Plan Active Problems:   Acute on chronic respiratory failure with hypoxia (HCC)   COVID-19 virus infection   Acute respiratory distress syndrome (ARDS) due to COVID-19 virus (HCC)   Pneumonia due to COVID-19 virus   Acute renal failure due to tubular necrosis (HCC)   1. Acute on chronic respiratory failure hypoxia patient was attempted at weaning however had increased respiratory rate and failed to be placed back on the ventilator. 2. COVID-19 virus infection in resolution we will continue to follow 3. Acute respiratory distress due to COVID-19 no change 4. Pneumonia due to COVID-19 slow to improve 5. ATN no change we will continue to follow   I have personally seen and evaluated the patient, evaluated laboratory and imaging results, formulated the assessment and plan and placed orders. The Patient requires high complexity decision making with multiple systems involvement.  Rounds were done with the Respiratory Therapy Director and Staff therapists and discussed with  nursing staff also.  Allyne Gee, MD [facility] Pulmonary Critical Care Medicine Sleep Medicine

## 2019-12-18 ENCOUNTER — Other Ambulatory Visit (HOSPITAL_COMMUNITY): Payer: BLUE CROSS/BLUE SHIELD

## 2019-12-18 DIAGNOSIS — U071 COVID-19: Secondary | ICD-10-CM | POA: Diagnosis not present

## 2019-12-18 DIAGNOSIS — J9621 Acute and chronic respiratory failure with hypoxia: Secondary | ICD-10-CM | POA: Diagnosis not present

## 2019-12-18 DIAGNOSIS — N170 Acute kidney failure with tubular necrosis: Secondary | ICD-10-CM | POA: Diagnosis not present

## 2019-12-18 DIAGNOSIS — J80 Acute respiratory distress syndrome: Secondary | ICD-10-CM | POA: Diagnosis not present

## 2019-12-18 LAB — BASIC METABOLIC PANEL
Anion gap: 5 (ref 5–15)
BUN: 87 mg/dL — ABNORMAL HIGH (ref 6–20)
CO2: 38 mmol/L — ABNORMAL HIGH (ref 22–32)
Calcium: 8.8 mg/dL — ABNORMAL LOW (ref 8.9–10.3)
Chloride: 109 mmol/L (ref 98–111)
Creatinine, Ser: 1.08 mg/dL (ref 0.61–1.24)
GFR calc Af Amer: >60 mL/min (ref >60)
GFR calc non Af Amer: >60 mL/min (ref >60)
Glucose, Bld: 127 mg/dL — ABNORMAL HIGH (ref 70–99)
Potassium: 5.6 mmol/L — ABNORMAL HIGH (ref 3.5–5.1)
Sodium: 152 mmol/L — ABNORMAL HIGH (ref 135–145)

## 2019-12-18 LAB — CBC
HCT: 29.5 % — ABNORMAL LOW (ref 39.0–52.0)
Hemoglobin: 8.3 g/dL — ABNORMAL LOW (ref 13.0–17.0)
MCH: 31.6 pg (ref 26.0–34.0)
MCHC: 28.1 g/dL — ABNORMAL LOW (ref 30.0–36.0)
MCV: 112.2 fL — ABNORMAL HIGH (ref 80.0–100.0)
Platelets: 108 10*3/uL — ABNORMAL LOW (ref 150–400)
RBC: 2.63 MIL/uL — ABNORMAL LOW (ref 4.22–5.81)
RDW: 24.5 % — ABNORMAL HIGH (ref 11.5–15.5)
WBC: 9.5 10*3/uL (ref 4.0–10.5)
nRBC: 0.5 % — ABNORMAL HIGH (ref 0.0–0.2)

## 2019-12-18 NOTE — Progress Notes (Signed)
Pulmonary Critical Care Medicine [facility] [place]   PULMONARY CRITICAL CARE SERVICE  PROGRESS NOTE  Date of Service: 12/18/2019  Danny Leach  WIO:[id number]  DOB: 05-24-1965   DOA: 12/06/19  Referring Physician: Carron Curie, MD  HPI: Danny Leach is a 54 y.o. male seen for follow up of Acute on Chronic Respiratory Failure.  Continues to do poorly with weaning.  Reviewed the chest x-ray patient still has diffuse pulmonary damage.  Also did review the CT scan which also showed diffuse pulmonary damage along with bronchiectatic changes  Medications: Reviewed on Rounds  Physical Exam:  Vitals: Temperature is 96.4 pulse 94 respiratory 38 blood pressure is 143/70 saturations 100%  Ventilator Settings on pressure control FiO2 60% PEEP of 8 tidal volume 445 IP 24  . General: Comfortable at this time . Eyes: Grossly normal lids, irises & conjunctiva . ENT: grossly tongue is normal . Neck: no obvious mass . Cardiovascular: S1 S2 normal no gallop . Respiratory: Scattered rhonchi coarse breath sounds are noted . Abdomen: soft . Skin: no rash seen on limited exam . Musculoskeletal: not rigid . Psychiatric:unable to assess . Neurologic: no seizure no involuntary movements         Lab Data:   Basic Metabolic Panel: Recent Labs  Lab 12/11/19 1727 12/13/19 0500 12/14/19 1118 12/17/19 1637 12/18/19 0442  NA 146* 145 145 151* 152*  K 4.3 3.7 5.1 5.6* 5.6*  CL 108 108 108 110 109  CO2 31 31 29  34* 38*  GLUCOSE 284* 110* 147* 180* 127*  BUN 74* 75* 76* 82* 87*  CREATININE 1.39* 1.42* 1.28* 1.08 1.08  CALCIUM 7.6* 8.3* 8.4* 8.7* 8.8*    ABG: Recent Labs  Lab 12/15/19 1610 12/16/19 0510 12/16/19 1016  PHART 7.297* 7.236* 7.328*  PCO2ART 71.5* 85.4* 68.5*  PO2ART 64.4* 111* 84.7  HCO3 34.1* 35.4* 34.9*  O2SAT 94.2 98.6 97.8    Liver Function Tests: No results for input(s): AST, ALT, ALKPHOS, BILITOT, PROT, ALBUMIN in the last 168 hours. No  results for input(s): LIPASE, AMYLASE in the last 168 hours. No results for input(s): AMMONIA in the last 168 hours.  CBC: Recent Labs  Lab 12/13/19 0500 12/18/19 0442  WBC 9.7 9.5  HGB 7.3* 8.3*  HCT 23.6* 29.5*  MCV 99.6 112.2*  PLT 152 108*    Cardiac Enzymes: No results for input(s): CKTOTAL, CKMB, CKMBINDEX, TROPONINI in the last 168 hours.  BNP (last 3 results) No results for input(s): BNP in the last 8760 hours.  ProBNP (last 3 results) No results for input(s): PROBNP in the last 8760 hours.  Radiological Exams: DG CHEST PORT 1 VIEW  Result Date: 12/18/2019 CLINICAL DATA:  Acute respiratory failure superimposed upon chronic ventilator dependent respiratory failure. Personal history of COVID-19 pneumonia in May, 2021 with subsequent ARDS. EXAM: PORTABLE CHEST 1 VIEW COMPARISON:  12/08/2019 and earlier.  CTA chest 12/10/2019. FINDINGS: Tracheostomy tube tip in satisfactory position below the thoracic inlet. Cardiac silhouette mildly enlarged for AP portable technique, unchanged. Since the CT 8 days ago, no significant change in the interstitial and airspace opacities throughout both lungs, most confluent in the RIGHT LOWER LOBE, with associated diffuse bronchiectasis. No new abnormalities. IMPRESSION: 1. Stable pneumonia and/or ARDS throughout both lungs, most confluent in the RIGHT LOWER LOBE, associated with diffuse bronchiectasis. 2. No new abnormalities. Electronically Signed   By: 12/12/2019 M.D.   On: 12/18/2019 08:23    Assessment/Plan Active Problems:   Acute on chronic respiratory  failure with hypoxia (HCC)   COVID-19 virus infection   Acute respiratory distress syndrome (ARDS) due to COVID-19 virus (HCC)   Pneumonia due to COVID-19 virus   Acute renal failure due to tubular necrosis (HCC)   1. Acute on chronic respiratory failure hypoxia plan is going to be to continue with pressure control mode on an FiO2 of 60% and PEEP of 8 patient is on a PEEP  24 2. COVID-19 virus infection in resolution with severe residual pulmonary damage 3. ARDS radiology does not really show much improvement 4. Pneumonia due to COVID-19 has been treated 5. Acute renal failure following labs   I have personally seen and evaluated the patient, evaluated laboratory and imaging results, formulated the assessment and plan and placed orders. The Patient requires high complexity decision making with multiple systems involvement.  Rounds were done with the Respiratory Therapy Director and Staff therapists and discussed with nursing staff also.  Yevonne Pax, MD [facility] Pulmonary Critical Care Medicine Sleep Medicine

## 2019-12-18 DEATH — deceased

## 2019-12-19 DIAGNOSIS — J80 Acute respiratory distress syndrome: Secondary | ICD-10-CM | POA: Diagnosis not present

## 2019-12-19 DIAGNOSIS — U071 COVID-19: Secondary | ICD-10-CM | POA: Diagnosis not present

## 2019-12-19 DIAGNOSIS — N170 Acute kidney failure with tubular necrosis: Secondary | ICD-10-CM | POA: Diagnosis not present

## 2019-12-19 DIAGNOSIS — J9621 Acute and chronic respiratory failure with hypoxia: Secondary | ICD-10-CM | POA: Diagnosis not present

## 2019-12-19 LAB — BASIC METABOLIC PANEL
Anion gap: 7 (ref 5–15)
BUN: 101 mg/dL — ABNORMAL HIGH (ref 6–20)
CO2: 40 mmol/L — ABNORMAL HIGH (ref 22–32)
Calcium: 8.4 mg/dL — ABNORMAL LOW (ref 8.9–10.3)
Chloride: 105 mmol/L (ref 98–111)
Creatinine, Ser: 1.37 mg/dL — ABNORMAL HIGH (ref 0.61–1.24)
GFR calc Af Amer: >60 mL/min (ref >60)
GFR calc non Af Amer: 57 mL/min — ABNORMAL LOW (ref >60)
Glucose, Bld: 154 mg/dL — ABNORMAL HIGH (ref 70–99)
Potassium: 5.1 mmol/L (ref 3.5–5.1)
Sodium: 152 mmol/L — ABNORMAL HIGH (ref 135–145)

## 2019-12-19 LAB — CULTURE, RESPIRATORY W GRAM STAIN

## 2019-12-19 NOTE — Progress Notes (Signed)
Pulmonary Critical Care Medicine [facility] [place]   PULMONARY CRITICAL CARE SERVICE  PROGRESS NOTE  Date of Service: 12/19/2019  Danny Leach  NWG:[id number]  DOB: [birth year]   DOA: 12/13/2019  Referring Physician: Carron Curie, MD  HPI: Danny Leach is a 54 y.o. male seen for follow up of Acute on Chronic Respiratory Failure.  On full support and pressure control mode has not been tolerating any weaning.  Chest x-ray CTs have shown severe pulmonary damage post Covid.  Medications: Reviewed on Rounds  Physical Exam:  Vitals: Temperature is 98.0 pulse 104 respiratory 33 blood pressure is 166/78 saturations 100%  Ventilator Settings on pressure assist control FiO2 30% IP 24 PEEP 8  . General: Comfortable at this time . Eyes: Grossly normal lids, irises & conjunctiva . ENT: grossly tongue is normal . Neck: no obvious mass . Cardiovascular: S1 S2 normal no gallop . Respiratory: Scattered rhonchi expansion is equal . Abdomen: soft . Skin: no rash seen on limited exam . Musculoskeletal: not rigid . Psychiatric:unable to assess . Neurologic: no seizure no involuntary movements         Lab Data:   Basic Metabolic Panel: Recent Labs  Lab 12/13/19 0500 12/14/19 1118 12/17/19 1637 12/18/19 0442 12/19/19 0735  NA 145 145 151* 152* 152*  K 3.7 5.1 5.6* 5.6* 5.1  CL 108 108 110 109 105  CO2 31 29 34* 38* 40*  GLUCOSE 110* 147* 180* 127* 154*  BUN 75* 76* 82* 87* 101*  CREATININE 1.42* 1.28* 1.08 1.08 1.37*  CALCIUM 8.3* 8.4* 8.7* 8.8* 8.4*    ABG: Recent Labs  Lab 12/15/19 1610 12/16/19 0510 12/16/19 1016  PHART 7.297* 7.236* 7.328*  PCO2ART 71.5* 85.4* 68.5*  PO2ART 64.4* 111* 84.7  HCO3 34.1* 35.4* 34.9*  O2SAT 94.2 98.6 97.8    Liver Function Tests: No results for input(s): AST, ALT, ALKPHOS, BILITOT, PROT, ALBUMIN in the last 168 hours. No results for input(s): LIPASE, AMYLASE in the last 168 hours. No results for input(s): AMMONIA in  the last 168 hours.  CBC: Recent Labs  Lab 12/13/19 0500 12/18/19 0442  WBC 9.7 9.5  HGB 7.3* 8.3*  HCT 23.6* 29.5*  MCV 99.6 112.2*  PLT 152 108*    Cardiac Enzymes: No results for input(s): CKTOTAL, CKMB, CKMBINDEX, TROPONINI in the last 168 hours.  BNP (last 3 results) No results for input(s): BNP in the last 8760 hours.  ProBNP (last 3 results) No results for input(s): PROBNP in the last 8760 hours.  Radiological Exams: DG CHEST PORT 1 VIEW  Result Date: 12/18/2019 CLINICAL DATA:  Acute respiratory failure superimposed upon chronic ventilator dependent respiratory failure. Personal history of COVID-19 pneumonia in May, 2021 with subsequent ARDS. EXAM: PORTABLE CHEST 1 VIEW COMPARISON:  12/08/2019 and earlier.  CTA chest 12/10/2019. FINDINGS: Tracheostomy tube tip in satisfactory position below the thoracic inlet. Cardiac silhouette mildly enlarged for AP portable technique, unchanged. Since the CT 8 days ago, no significant change in the interstitial and airspace opacities throughout both lungs, most confluent in the RIGHT LOWER LOBE, with associated diffuse bronchiectasis. No new abnormalities. IMPRESSION: 1. Stable pneumonia and/or ARDS throughout both lungs, most confluent in the RIGHT LOWER LOBE, associated with diffuse bronchiectasis. 2. No new abnormalities. Electronically Signed   By: Hulan Saas M.D.   On: 12/18/2019 08:23    Assessment/Plan Active Problems:   Acute on chronic respiratory failure with hypoxia (HCC)   COVID-19 virus infection   Acute respiratory distress syndrome (  ARDS) due to COVID-19 virus (HCC)   Pneumonia due to COVID-19 virus   Acute renal failure due to tubular necrosis (HCC)   1. Acute on chronic respiratory failure hypoxia we will continue with full support on the ventilator not a candidate for weaning at this time we will continue pulmonary toilet supportive care 2. COVID-19 virus infection in resolution with severe pulmonary  damage 3. ARDS no improvement noted on the x-rays 4. Pneumonia due to COVID-19 again there is still residual damage no clinical improvement 5. Acute renal failure this has improved we will continue to monitor labs   I have personally seen and evaluated the patient, evaluated laboratory and imaging results, formulated the assessment and plan and placed orders. The Patient requires high complexity decision making with multiple systems involvement.  Rounds were done with the Respiratory Therapy Director and Staff therapists and discussed with nursing staff also.  Danny Pax, MD [facility] Pulmonary Critical Care Medicine Sleep Medicine

## 2019-12-20 DIAGNOSIS — J9621 Acute and chronic respiratory failure with hypoxia: Secondary | ICD-10-CM | POA: Diagnosis not present

## 2019-12-20 DIAGNOSIS — J80 Acute respiratory distress syndrome: Secondary | ICD-10-CM | POA: Diagnosis not present

## 2019-12-20 DIAGNOSIS — U071 COVID-19: Secondary | ICD-10-CM | POA: Diagnosis not present

## 2019-12-20 DIAGNOSIS — N170 Acute kidney failure with tubular necrosis: Secondary | ICD-10-CM | POA: Diagnosis not present

## 2019-12-20 LAB — CBC
HCT: 26.9 % — ABNORMAL LOW (ref 39.0–52.0)
Hemoglobin: 7.8 g/dL — ABNORMAL LOW (ref 13.0–17.0)
MCH: 32.1 pg (ref 26.0–34.0)
MCHC: 29 g/dL — ABNORMAL LOW (ref 30.0–36.0)
MCV: 110.7 fL — ABNORMAL HIGH (ref 80.0–100.0)
Platelets: 91 10*3/uL — ABNORMAL LOW (ref 150–400)
RBC: 2.43 MIL/uL — ABNORMAL LOW (ref 4.22–5.81)
RDW: 24.5 % — ABNORMAL HIGH (ref 11.5–15.5)
WBC: 9 10*3/uL (ref 4.0–10.5)
nRBC: 0.4 % — ABNORMAL HIGH (ref 0.0–0.2)

## 2019-12-20 LAB — BASIC METABOLIC PANEL
Anion gap: 8 (ref 5–15)
Anion gap: 3 — ABNORMAL LOW (ref 5–15)
BUN: 99 mg/dL — ABNORMAL HIGH (ref 6–20)
BUN: 106 mg/dL — ABNORMAL HIGH (ref 6–20)
CO2: 34 mmol/L — ABNORMAL HIGH (ref 22–32)
CO2: 37 mmol/L — ABNORMAL HIGH (ref 22–32)
Calcium: 8.2 mg/dL — ABNORMAL LOW (ref 8.9–10.3)
Calcium: 6.6 mg/dL — ABNORMAL LOW (ref 8.9–10.3)
Chloride: 105 mmol/L (ref 98–111)
Chloride: 106 mmol/L (ref 98–111)
Creatinine, Ser: 1.19 mg/dL (ref 0.61–1.24)
Creatinine, Ser: 1.21 mg/dL (ref 0.61–1.24)
GFR calc Af Amer: >60 mL/min (ref >60)
GFR calc Af Amer: >60 mL/min (ref >60)
GFR calc non Af Amer: >60 mL/min (ref >60)
GFR calc non Af Amer: >60 mL/min (ref >60)
Glucose, Bld: 119 mg/dL — ABNORMAL HIGH (ref 70–99)
Glucose, Bld: 130 mg/dL — ABNORMAL HIGH (ref 70–99)
Potassium: 4.2 mmol/L (ref 3.5–5.1)
Potassium: 4.2 mmol/L (ref 3.5–5.1)
Sodium: 147 mmol/L — ABNORMAL HIGH (ref 135–145)
Sodium: 146 mmol/L — ABNORMAL HIGH (ref 135–145)

## 2019-12-20 NOTE — Progress Notes (Signed)
Pulmonary Critical Care Medicine [facility] [place]   PULMONARY CRITICAL CARE SERVICE  PROGRESS NOTE  Date of Service: 12/20/2019  Danny Leach  BZJ:[id number]  DOB: 01-15-1966   DOA: 12/19/2019  Referring Physician: Carron Curie, MD  HPI: Danny Leach is a 54 y.o. male seen for follow up of Acute on Chronic Respiratory Failure.  Patient once again was attempted on weaning however failed back on the ventilator and full support and pressure assist control right now is on 50% FiO2  Medications: Reviewed on Rounds  Physical Exam:  Vitals: Temperature is 97.6 pulse 87 respiratory rate 27 blood pressure is 140/77 saturations 92%  Ventilator Settings on pressure assist control FiO2 is 50% tidal volume 675 IP 24 PEEP 8  . General: Comfortable at this time . Eyes: Grossly normal lids, irises & conjunctiva . ENT: grossly tongue is normal . Neck: no obvious mass . Cardiovascular: S1 S2 normal no gallop . Respiratory: No rhonchi no rales are noted at this time . Abdomen: soft . Skin: no rash seen on limited exam . Musculoskeletal: not rigid . Psychiatric:unable to assess . Neurologic: no seizure no involuntary movements         Lab Data:   Basic Metabolic Panel: Recent Labs  Lab 12/14/19 1118 12/17/19 1637 12/18/19 0442 12/19/19 0735 12/20/19 0619  NA 145 151* 152* 152* 147*  K 5.1 5.6* 5.6* 5.1 4.2  CL 108 110 109 105 105  CO2 29 34* 38* 40* 34*  GLUCOSE 147* 180* 127* 154* 119*  BUN 76* 82* 87* 101* 99*  CREATININE 1.28* 1.08 1.08 1.37* 1.19  CALCIUM 8.4* 8.7* 8.8* 8.4* 8.2*    ABG: Recent Labs  Lab 12/15/19 1610 12/16/19 0510 12/16/19 1016  PHART 7.297* 7.236* 7.328*  PCO2ART 71.5* 85.4* 68.5*  PO2ART 64.4* 111* 84.7  HCO3 34.1* 35.4* 34.9*  O2SAT 94.2 98.6 97.8    Liver Function Tests: No results for input(s): AST, ALT, ALKPHOS, BILITOT, PROT, ALBUMIN in the last 168 hours. No results for input(s): LIPASE, AMYLASE in the last 168  hours. No results for input(s): AMMONIA in the last 168 hours.  CBC: Recent Labs  Lab 12/18/19 0442 12/20/19 0619  WBC 9.5 9.0  HGB 8.3* 7.8*  HCT 29.5* 26.9*  MCV 112.2* 110.7*  PLT 108* 91*    Cardiac Enzymes: No results for input(s): CKTOTAL, CKMB, CKMBINDEX, TROPONINI in the last 168 hours.  BNP (last 3 results) No results for input(s): BNP in the last 8760 hours.  ProBNP (last 3 results) No results for input(s): PROBNP in the last 8760 hours.  Radiological Exams: No results found.  Assessment/Plan Active Problems:   Acute on chronic respiratory failure with hypoxia (HCC)   COVID-19 virus infection   Acute respiratory distress syndrome (ARDS) due to COVID-19 virus (HCC)   Pneumonia due to COVID-19 virus   Acute renal failure due to tubular necrosis (HCC)   1. Acute on chronic respiratory failure hypoxia plan is to continue with pressure assist control titrate oxygen as tolerated.  Patient is not able to wean and is not likely to wean short-term 2. COVID-19 virus infection in resolution 3. Acute respiratory distress due to no improvement has not been tolerating weaning 4. Pneumonia due to COVID-19 treated 5. Acute renal failure resolved   I have personally seen and evaluated the patient, evaluated laboratory and imaging results, formulated the assessment and plan and placed orders. The Patient requires high complexity decision making with multiple systems involvement.  Rounds were  done with the Respiratory Therapy Director and Staff therapists and discussed with nursing staff also.  Allyne Gee, MD [facility] Pulmonary Critical Care Medicine Sleep Medicine

## 2019-12-21 DIAGNOSIS — J9621 Acute and chronic respiratory failure with hypoxia: Secondary | ICD-10-CM | POA: Diagnosis not present

## 2019-12-21 DIAGNOSIS — N170 Acute kidney failure with tubular necrosis: Secondary | ICD-10-CM | POA: Diagnosis not present

## 2019-12-21 DIAGNOSIS — U071 COVID-19: Secondary | ICD-10-CM | POA: Diagnosis not present

## 2019-12-21 DIAGNOSIS — J80 Acute respiratory distress syndrome: Secondary | ICD-10-CM | POA: Diagnosis not present

## 2019-12-21 NOTE — Progress Notes (Signed)
Pulmonary Critical Care Medicine [facility] [place]   PULMONARY CRITICAL CARE SERVICE  PROGRESS NOTE  Date of Service: 12/21/2019  Danny Leach  KGM:[id number]  DOB: Jan 31, 1966   DOA: 16-Dec-2019  Referring Physician: Carron Curie, MD  HPI: Danny Leach is a 54 y.o. male seen for follow up of Acute on Chronic Respiratory Failure.  Patient right now is on pressure control mode has been on 55% FiO2 has not been tolerating any attempts at weaning.  Medications: Reviewed on Rounds  Physical Exam:  Vitals: Temperature 96.7 pulse 94 respiratory 27 blood pressure is 160/50 saturations 98%  Ventilator Settings on pressure assist control FiO2 is 55% IP 24 PEEP 8  . General: Comfortable at this time . Eyes: Grossly normal lids, irises & conjunctiva . ENT: grossly tongue is normal . Neck: no obvious mass . Cardiovascular: S1 S2 normal no gallop . Respiratory: No rhonchi coarse breath sounds . Abdomen: soft . Skin: no rash seen on limited exam . Musculoskeletal: not rigid . Psychiatric:unable to assess . Neurologic: no seizure no involuntary movements         Lab Data:   Basic Metabolic Panel: Recent Labs  Lab 12/17/19 1637 12/18/19 0442 12/19/19 0735 12/20/19 0619 12/20/19 0959  NA 151* 152* 152* 147* 146*  K 5.6* 5.6* 5.1 4.2 4.2  CL 110 109 105 105 106  CO2 34* 38* 40* 34* 37*  GLUCOSE 180* 127* 154* 119* 130*  BUN 82* 87* 101* 99* 106*  CREATININE 1.08 1.08 1.37* 1.19 1.21  CALCIUM 8.7* 8.8* 8.4* 8.2* 6.6*    ABG: Recent Labs  Lab 12/15/19 1610 12/16/19 0510 12/16/19 1016  PHART 7.297* 7.236* 7.328*  PCO2ART 71.5* 85.4* 68.5*  PO2ART 64.4* 111* 84.7  HCO3 34.1* 35.4* 34.9*  O2SAT 94.2 98.6 97.8    Liver Function Tests: No results for input(s): AST, ALT, ALKPHOS, BILITOT, PROT, ALBUMIN in the last 168 hours. No results for input(s): LIPASE, AMYLASE in the last 168 hours. No results for input(s): AMMONIA in the last 168  hours.  CBC: Recent Labs  Lab 12/18/19 0442 12/20/19 0619  WBC 9.5 9.0  HGB 8.3* 7.8*  HCT 29.5* 26.9*  MCV 112.2* 110.7*  PLT 108* 91*    Cardiac Enzymes: No results for input(s): CKTOTAL, CKMB, CKMBINDEX, TROPONINI in the last 168 hours.  BNP (last 3 results) No results for input(s): BNP in the last 8760 hours.  ProBNP (last 3 results) No results for input(s): PROBNP in the last 8760 hours.  Radiological Exams: No results found.  Assessment/Plan Active Problems:   Acute on chronic respiratory failure with hypoxia (HCC)   COVID-19 virus infection   Acute respiratory distress syndrome (ARDS) due to COVID-19 virus (HCC)   Pneumonia due to COVID-19 virus   Acute renal failure due to tubular necrosis (HCC)   1. Acute on chronic respiratory failure with hypoxia we will continue with full support on the ventilator not tolerating any weaning attempts at this time. 2. COVID-19 virus infection supportive care has been been in resolution with residual pulmonary damage 3. ARDS slow to resolve 4. Pneumonia due to COVID-19 treated 5. Acute renal failure resolved we will continue with supportive care   I have personally seen and evaluated the patient, evaluated laboratory and imaging results, formulated the assessment and plan and placed orders. The Patient requires high complexity decision making with multiple systems involvement.  Rounds were done with the Respiratory Therapy Director and Staff therapists and discussed with nursing staff also.  Allyne Gee, MD [facility] Pulmonary Critical Care Medicine Sleep Medicine

## 2019-12-22 DIAGNOSIS — J9621 Acute and chronic respiratory failure with hypoxia: Secondary | ICD-10-CM | POA: Diagnosis not present

## 2019-12-22 DIAGNOSIS — N170 Acute kidney failure with tubular necrosis: Secondary | ICD-10-CM | POA: Diagnosis not present

## 2019-12-22 DIAGNOSIS — J80 Acute respiratory distress syndrome: Secondary | ICD-10-CM | POA: Diagnosis not present

## 2019-12-22 DIAGNOSIS — U071 COVID-19: Secondary | ICD-10-CM | POA: Diagnosis not present

## 2019-12-22 LAB — CULTURE, BLOOD (ROUTINE X 2)
Culture: NO GROWTH
Culture: NO GROWTH
Special Requests: ADEQUATE
Special Requests: ADEQUATE

## 2019-12-22 LAB — BASIC METABOLIC PANEL
Anion gap: 9 (ref 5–15)
BUN: 107 mg/dL — ABNORMAL HIGH (ref 6–20)
CO2: 35 mmol/L — ABNORMAL HIGH (ref 22–32)
Calcium: 8.3 mg/dL — ABNORMAL LOW (ref 8.9–10.3)
Chloride: 100 mmol/L (ref 98–111)
Creatinine, Ser: 1.29 mg/dL — ABNORMAL HIGH (ref 0.61–1.24)
GFR calc Af Amer: >60 mL/min (ref >60)
GFR calc non Af Amer: >60 mL/min (ref >60)
Glucose, Bld: 156 mg/dL — ABNORMAL HIGH (ref 70–99)
Potassium: 4.7 mmol/L (ref 3.5–5.1)
Sodium: 144 mmol/L (ref 135–145)

## 2019-12-22 LAB — PREPARE RBC (CROSSMATCH)

## 2019-12-22 LAB — CBC
HCT: 23.3 % — ABNORMAL LOW (ref 39.0–52.0)
Hemoglobin: 6.9 g/dL — CL (ref 13.0–17.0)
MCH: 32.5 pg (ref 26.0–34.0)
MCHC: 29.6 g/dL — ABNORMAL LOW (ref 30.0–36.0)
MCV: 109.9 fL — ABNORMAL HIGH (ref 80.0–100.0)
Platelets: 92 10*3/uL — ABNORMAL LOW (ref 150–400)
RBC: 2.12 MIL/uL — ABNORMAL LOW (ref 4.22–5.81)
RDW: 24.4 % — ABNORMAL HIGH (ref 11.5–15.5)
WBC: 6.5 10*3/uL (ref 4.0–10.5)
nRBC: 0.5 % — ABNORMAL HIGH (ref 0.0–0.2)

## 2019-12-22 NOTE — Progress Notes (Signed)
Pulmonary Critical Care Medicine [facility] [place]   PULMONARY CRITICAL CARE SERVICE  PROGRESS NOTE  Date of Service: 12/22/2019  Danny Leach  URK:[id number]  DOB: [birth year]   DOA: 12/14/2019  Referring Physician: Carron Curie, MD  HPI: Danny Leach is a 54 y.o. male seen for follow up of Acute on Chronic Respiratory Failure.  Patient currently is on full support on the ventilator.  Continues to do poorly not tolerating weaning.  Currently is on 60% FiO2 with a PEEP of 8  Medications: Reviewed on Rounds  Physical Exam:  Vitals: Temperature 99.7 pulse 96 respiratory 28 blood pressure is 140/76 saturations 96%  Ventilator Settings on pressure assist control FiO2 60% IP 24 PEEP 8  . General: Comfortable at this time . Eyes: Grossly normal lids, irises & conjunctiva . ENT: grossly tongue is normal . Neck: no obvious mass . Cardiovascular: S1 S2 normal no gallop . Respiratory: No rhonchi coarse breath sounds . Abdomen: soft . Skin: no rash seen on limited exam . Musculoskeletal: not rigid . Psychiatric:unable to assess . Neurologic: no seizure no involuntary movements         Lab Data:   Basic Metabolic Panel: Recent Labs  Lab 12/18/19 0442 12/19/19 0735 12/20/19 0619 12/20/19 0959 12/22/19 0558  NA 152* 152* 147* 146* 144  K 5.6* 5.1 4.2 4.2 4.7  CL 109 105 105 106 100  CO2 38* 40* 34* 37* 35*  GLUCOSE 127* 154* 119* 130* 156*  BUN 87* 101* 99* 106* 107*  CREATININE 1.08 1.37* 1.19 1.21 1.29*  CALCIUM 8.8* 8.4* 8.2* 6.6* 8.3*    ABG: Recent Labs  Lab 12/15/19 1610 12/16/19 0510 12/16/19 1016  PHART 7.297* 7.236* 7.328*  PCO2ART 71.5* 85.4* 68.5*  PO2ART 64.4* 111* 84.7  HCO3 34.1* 35.4* 34.9*  O2SAT 94.2 98.6 97.8    Liver Function Tests: No results for input(s): AST, ALT, ALKPHOS, BILITOT, PROT, ALBUMIN in the last 168 hours. No results for input(s): LIPASE, AMYLASE in the last 168 hours. No results for input(s): AMMONIA in  the last 168 hours.  CBC: Recent Labs  Lab 12/18/19 0442 12/20/19 0619 12/22/19 0558  WBC 9.5 9.0 6.5  HGB 8.3* 7.8* 6.9*  HCT 29.5* 26.9* 23.3*  MCV 112.2* 110.7* 109.9*  PLT 108* 91* 92*    Cardiac Enzymes: No results for input(s): CKTOTAL, CKMB, CKMBINDEX, TROPONINI in the last 168 hours.  BNP (last 3 results) No results for input(s): BNP in the last 8760 hours.  ProBNP (last 3 results) No results for input(s): PROBNP in the last 8760 hours.  Radiological Exams: No results found.  Assessment/Plan Active Problems:   Acute on chronic respiratory failure with hypoxia (HCC)   COVID-19 virus infection   Acute respiratory distress syndrome (ARDS) due to COVID-19 virus (HCC)   Pneumonia due to COVID-19 virus   Acute renal failure due to tubular necrosis (HCC)   1. Acute on chronic respiratory failure hypoxia plan is going to be to continue with pressure control mode FiO2 60% continue secretion management supportive care 2. COVID-19 virus infection in recovery phase patient has severe residual pulmonary damage 3. Acute respiratory distress no improvement noted 4. Pneumonia due to COVID-19 treated 5. Acute renal failure tubular necrosis we will continue to follow along   I have personally seen and evaluated the patient, evaluated laboratory and imaging results, formulated the assessment and plan and placed orders. The Patient requires high complexity decision making with multiple systems involvement.  Rounds were done  with the Respiratory Therapy Director and Staff therapists and discussed with nursing staff also.  Allyne Gee, MD [facility] Pulmonary Critical Care Medicine Sleep Medicine

## 2019-12-23 DIAGNOSIS — J80 Acute respiratory distress syndrome: Secondary | ICD-10-CM | POA: Diagnosis not present

## 2019-12-23 DIAGNOSIS — J9621 Acute and chronic respiratory failure with hypoxia: Secondary | ICD-10-CM | POA: Diagnosis not present

## 2019-12-23 DIAGNOSIS — U071 COVID-19: Secondary | ICD-10-CM | POA: Diagnosis not present

## 2019-12-23 DIAGNOSIS — N170 Acute kidney failure with tubular necrosis: Secondary | ICD-10-CM | POA: Diagnosis not present

## 2019-12-23 LAB — BASIC METABOLIC PANEL
Anion gap: 10 (ref 5–15)
BUN: 120 mg/dL — ABNORMAL HIGH (ref 6–20)
CO2: 31 mmol/L (ref 22–32)
Calcium: 8.5 mg/dL — ABNORMAL LOW (ref 8.9–10.3)
Chloride: 104 mmol/L (ref 98–111)
Creatinine, Ser: 1.28 mg/dL — ABNORMAL HIGH (ref 0.61–1.24)
GFR calc Af Amer: >60 mL/min (ref >60)
GFR calc non Af Amer: >60 mL/min (ref >60)
Glucose, Bld: 164 mg/dL — ABNORMAL HIGH (ref 70–99)
Potassium: 5 mmol/L (ref 3.5–5.1)
Sodium: 145 mmol/L (ref 135–145)

## 2019-12-23 LAB — TYPE AND SCREEN
ABO/RH(D): B POS
Antibody Screen: NEGATIVE
Unit division: 0

## 2019-12-23 LAB — BPAM RBC
Blood Product Expiration Date: 202108312359
ISSUE DATE / TIME: 202108051324
Unit Type and Rh: 7300

## 2019-12-23 NOTE — Progress Notes (Addendum)
Pulmonary Critical Care Medicine [facility] [place]   PULMONARY CRITICAL CARE SERVICE  PROGRESS NOTE  Date of Service: 12/23/2019  Danny Leach  KYH:[id number]  DOB: [birth year]   DOA: 26-Dec-2019  Referring Physician: Carron Curie, MD  HPI: Danny Leach is a 54 y.o. male seen for follow up of Acute on Chronic Respiratory Failure.  Patient remains on pressure control mode rate of 35 and FiO2 of 65% currently.  Satting well no distress.  Medications: Reviewed on Rounds  Physical Exam:  Vitals: Pulse 107 respirations 35 BP 130/73 O2 sat 96% temp 99.6  Ventilator Settings ventilator mode pressure control mode rate of 35 respiratory pressure of 24 PEEP of 8 with an FiO2 of 55%  . General: Comfortable at this time . Eyes: Grossly normal lids, irises & conjunctiva . ENT: grossly tongue is normal . Neck: no obvious mass . Cardiovascular: S1 S2 normal no gallop . Respiratory: Coarse breath sounds . Abdomen: soft . Skin: no rash seen on limited exam . Musculoskeletal: not rigid . Psychiatric:unable to assess . Neurologic: no seizure no involuntary movements         Lab Data:   Basic Metabolic Panel: Recent Labs  Lab 12/19/19 0735 12/20/19 0619 12/20/19 0959 12/22/19 0558 12/23/19 1830  NA 152* 147* 146* 144 145  K 5.1 4.2 4.2 4.7 5.0  CL 105 105 106 100 104  CO2 40* 34* 37* 35* 31  GLUCOSE 154* 119* 130* 156* 164*  BUN 101* 99* 106* 107* 120*  CREATININE 1.37* 1.19 1.21 1.29* 1.28*  CALCIUM 8.4* 8.2* 6.6* 8.3* 8.5*    ABG: No results for input(s): PHART, PCO2ART, PO2ART, HCO3, O2SAT in the last 168 hours.  Liver Function Tests: No results for input(s): AST, ALT, ALKPHOS, BILITOT, PROT, ALBUMIN in the last 168 hours. No results for input(s): LIPASE, AMYLASE in the last 168 hours. No results for input(s): AMMONIA in the last 168 hours.  CBC: Recent Labs  Lab 12/18/19 0442 12/20/19 0619 12/22/19 0558  WBC 9.5 9.0 6.5  HGB 8.3* 7.8* 6.9*   HCT 29.5* 26.9* 23.3*  MCV 112.2* 110.7* 109.9*  PLT 108* 91* 92*    Cardiac Enzymes: No results for input(s): CKTOTAL, CKMB, CKMBINDEX, TROPONINI in the last 168 hours.  BNP (last 3 results) No results for input(s): BNP in the last 8760 hours.  ProBNP (last 3 results) No results for input(s): PROBNP in the last 8760 hours.  Radiological Exams: No results found.  Assessment/Plan Active Problems:   Acute on chronic respiratory failure with hypoxia (HCC)   COVID-19 virus infection   Acute respiratory distress syndrome (ARDS) due to COVID-19 virus (HCC)   Pneumonia due to COVID-19 virus   Acute renal failure due to tubular necrosis (HCC)   1. Acute on chronic respiratory failure hypoxia plan is going to be to continue with pressure control mode FiO2 55% continue secretion management supportive care 2. COVID-19 virus infection in recovery phase patient has severe residual pulmonary damage 3. Acute respiratory distress no improvement noted 4. Pneumonia due to COVID-19 treated 5. Acute renal failure tubular necrosis we will continue to follow along   I have personally seen and evaluated the patient, evaluated laboratory and imaging results, formulated the assessment and plan and placed orders. The Patient requires high complexity decision making with multiple systems involvement.  Rounds were done with the Respiratory Therapy Director and Staff therapists and discussed with nursing staff also.  Yevonne Pax, MD [facility] Pulmonary Critical Care Medicine Sleep  Medicine

## 2019-12-23 NOTE — Progress Notes (Signed)
PROGRESS NOTE    Danny Leach  PZW:[id number] DOB: March 16, 1966 DOA: 12/04/2019   Brief Narrative:  Danny Leach is an 54 y.o. male his medical history significant of hypertension, hyperlipidemia who was diagnosed with COVID-19 infection 10/07/2019.  He was at home until 10/17/2019 when he started developing worsening shortness of breath, dizziness, increased work of breathing.  Patient presented to the acute facility and was initially admitted to the medical ICU.  CT scan showed multifocal pneumonia with bilateral mediastinal hilar lymphadenopathy.  He was given Tocilizumab x1 on 10/18/2019 and completed 15-day extended course of Decadron.  He was also diuresed.  Patient continued to have hypoxemia and was given Solu-Medrol as well.  On 11/06/2019 chest x-ray showed pulmonary edema and he was diuresed with some improvement.  On 11/06/2019 patient unfortunately desaturated into the 60s and was subsequently intubated and received multiple sedative drips for ventilator synchrony.  His hospital course was complicated with acute kidney injury at the outside facility.  On 11/17/2018 when he was started on empiric IV vancomycin and cefepime for pneumonia and he completed 7-day course of antibiotics.  His respiratory cultures after that were negative.  On 11/22/2018 when he underwent tracheostomy and PEG tube placement.  He unfortunately had encephalopathy.  However, head CT did not show any acute findings. He was transferred to [facility] and was admitted here on 11/22/2019 on prophylactic Bactrim and prednisone taper.  After admission here patient was found to be acidotic. 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  Patient started having high fevers with temperature of 101 on 12/04/2019.    He was treated with empiric IV vancomycin, Zosyn.  In the interim he also received treatment with Levaquin.  Blood cultures from 12/04/2019 did not show any growth in 4 days.  Tracheal  aspirate cultures consistent with normal respiratory flora.  He was treated with IV vancomycin, Zosyn.  However, after the antibiotics ended he started having fevers again.  Now on IV cefepime and Flagyl.    Assessment & Plan:   Active Problems:   Acute on chronic respiratory failure with hypoxia (HCC)   COVID-19 virus infection   Acute respiratory distress syndrome (ARDS) due to COVID-19 virus (HCC)   Pneumonia due to COVID-19 virus   Acute renal failure due to tubular necrosis (HCC) Systemic inflammatory response syndrome Leukocytosis Encephalopathy Sacrococcygeal pressure ulcer unstageable Dysphagia/malnutrition  Acute on chronic respiratory failure with hypoxemia: Initially had COVID-19 infection.  He subsequently developed ARDS.  He status post treatment with Tocilizumab and Decadron at the outside facility.    Here treated with hydroxyurea, folic acid, prednisone.  Remains on the ventilator on 50% FiO2, PEEP 10.  Respiratory culture showed normal flora which is highly concerning for ongoing aspiration.  CT chest on 12/10/2019 per report severe underlying pulmonary fibrosis with traction bronchiectasis and areas of bronchiolectasis, diffusely superimposed groundglass opacity with areas of heterogeneous airspace opacity.  On treatment with IV vancomycin, Zosyn with tentative end date 12/14/2019.  -After he completed antibiotics he started having fever again and now started on cefepime and Flagyl.   Blood cultures from 12/17/2019 did not show any growth.  He had tracheal aspirate cultures on 12/17/2019 which is showing few Candida tropicalis, predominant PMNs seen on the Gram stain.  Pulmonary following. This is highly suspicious for aspiration which is contributing to the recurrent pneumonia and respiratory failure. Unfortunately, if he has ongoing aspiration then he is at risk for worsening pneumonia despite being on antibiotics.  For now the tentative end date for the antibiotics would be  12/24/2019.  Pneumonia: Patient initially had COVID-19 infection with pneumonia. Remains on the ventilator.  He is at risk for ventilator associated pneumonia. He already received treatment with Tocilizumab, Decadron for the COVID-19 infection.  Here treated with hydroxyurea, folic acid.  He is also on prednisone. Treated empiric IV antibiotics for concern for bacterial pneumonia with IV vancomycin, Zosyn.  However, soon as antibiotics ended he started having fevers again and now started on IV vancomycin, cefepime. Plan as mentioned above.  Previous respiratory cultures showed normal respiratory flora which is concerning for aspiration in which case he is high risk for worsening pneumonia despite being on antibiotics. For now plan to treat for tentative duration of 1 week pending improvement. -If his respiratory status is not improving consider obtaining chest imaging preferably chest CT which could be done without contrast to better evaluate.  Acute renal failure: Likely secondary to ATN. Currently BUN 107, creatinine 1.29. Please monitor BUN/cr closely while on antibiotics.  Avoid nephrotoxic medications.  Encephalopathy: Patient started on acyclovir for suspicion of herpes encephalitis, ended on 12/13/2019. Continue supportive management per the primary team.  COVID-19 infection: He already received treatment with Tocilizumab and Decadron as mentioned above.  Currently on prednisone.  Patient was on prophylactic Bactrim.  If any worsening of his BUN and creatinine consider discontinuing the Bactrim while on the other antibiotics. Once he is done with the prednisone taper would recommend to discontinue the Bactrim.  Systemic inflammatory response syndrome with leukocytosis: On empiric antibiotics as mentioned above.  Blood cultures do not show any growth to date.  Continue to monitor.  Sacrococcygeal pressure ulcer unstageable: Continue local wound care.  Due to his debility and immobility he is  very high risk for worsening of the wound.  If the wound is worsening consider consulting surgery for evaluation.  Dysphagia/malnutrition: Management per primary team.  Due to dysphagia he is at risk for aspiration and worsening facial pain secondary to aspiration pneumonia.  Due to his complex medical problems he is very high risk for worsening and decompensation.   Plan of care discussed with the primary team and pharmacy.   Subjective: He is opening eyes but not following commands.  Objective: Vitals: Temperature 98.5, pulse 101, respiratory rate 36, blood pressure 164/79, pulse oximetry 100% on 55% FiO2 and PEEP of 5. Examination: Constitutional: Ill-appearing male, opening eyes but not following commands Head: Atraumatic, normocephalic Eyes: PERLA ENMT: external ears and nose appear normal, Lips appears normal, poor dentition  Neck: has trach in place CVS: S1-S2 Respiratory: Rhonchi, no wheezing Abdomen: soft, nondistended, positive bowel sounds Musculoskeletal:  Upper and lower extremity edema Neuro: Patient with trach on the vent, not following commands.  Unable to do neurologic exam at this time. Psych: Unable to assess Skin: Unstageable sacrococcygeal pressure ulcer, no rashes or lesions     Data Reviewed: I have personally reviewed following labs and imaging studies  CBC: Recent Labs  Lab 12/18/19 0442 12/20/19 0619 12/22/19 0558  WBC 9.5 9.0 6.5  HGB 8.3* 7.8* 6.9*  HCT 29.5* 26.9* 23.3*  MCV 112.2* 110.7* 109.9*  PLT 108* 91* 92*    Basic Metabolic Panel: Recent Labs  Lab 12/18/19 0442 12/19/19 0735 12/20/19 0619 12/20/19 0959 12/22/19 0558  NA 152* 152* 147* 146* 144  K 5.6* 5.1 4.2 4.2 4.7  CL 109 105 105 106 100  CO2 38* 40* 34* 37* 35*  GLUCOSE 127* 154* 119* 130* 156*  BUN 87* 101* 99* 106* 107*  CREATININE 1.08 1.37* 1.19 1.21 1.29*  CALCIUM 8.8* 8.4* 8.2* 6.6* 8.3*    GFR: CrCl cannot be calculated (Unknown ideal weight.).  Liver  Function Tests: No results for input(s): AST, ALT, ALKPHOS, BILITOT, PROT, ALBUMIN in the last 168 hours.  CBG: No results for input(s): GLUCAP in the last 168 hours.   Recent Results (from the past 240 hour(s))  Culture, blood (routine x 2)     Status: None   Collection Time: 12/17/19  4:46 PM   Specimen: BLOOD LEFT HAND  Result Value Ref Range Status   Specimen Description BLOOD LEFT HAND  Final   Special Requests   Final    BOTTLES DRAWN AEROBIC AND ANAEROBIC Blood Culture adequate volume   Culture   Final    NO GROWTH 5 DAYS Performed at [facility], [address], [place], [place] [address]    Report Status 12/22/2019 FINAL  Final  Culture, blood (routine x 2)     Status: None   Collection Time: 12/17/19  5:34 PM   Specimen: BLOOD LEFT HAND  Result Value Ref Range Status   Specimen Description BLOOD LEFT HAND  Final   Special Requests   Final    BOTTLES DRAWN AEROBIC AND ANAEROBIC Blood Culture adequate volume   Culture   Final    NO GROWTH 5 DAYS Performed at [facility], [address] [place], [place] [address]    Report Status 12/22/2019 FINAL  Final  Culture, respiratory     Status: None   Collection Time: 12/17/19  5:40 PM   Specimen: Tracheal Aspirate; Respiratory  Result Value Ref Range Status   Specimen Description TRACHEAL ASPIRATE  Final   Special Requests NONE  Final   Gram Stain   Final    FEW WBC PRESENT, PREDOMINANTLY PMN FEW YEAST Performed at [facility], [address], [place], [place] [address]    Culture FEW CANDIDA TROPICALIS  Final   Report Status 12/19/2019 FINAL  Final     Radiology Studies: No results found.  Scheduled Meds: Please see MAR   Vonzella Nipple, MD  12/23/2019, 4:34 PM

## 2019-12-24 DIAGNOSIS — N170 Acute kidney failure with tubular necrosis: Secondary | ICD-10-CM | POA: Diagnosis not present

## 2019-12-24 DIAGNOSIS — J80 Acute respiratory distress syndrome: Secondary | ICD-10-CM | POA: Diagnosis not present

## 2019-12-24 DIAGNOSIS — J9621 Acute and chronic respiratory failure with hypoxia: Secondary | ICD-10-CM | POA: Diagnosis not present

## 2019-12-24 DIAGNOSIS — U071 COVID-19: Secondary | ICD-10-CM | POA: Diagnosis not present

## 2019-12-24 LAB — BASIC METABOLIC PANEL
Anion gap: 12 (ref 5–15)
BUN: 126 mg/dL — ABNORMAL HIGH (ref 6–20)
CO2: 32 mmol/L (ref 22–32)
Calcium: 8.6 mg/dL — ABNORMAL LOW (ref 8.9–10.3)
Chloride: 104 mmol/L (ref 98–111)
Creatinine, Ser: 1.3 mg/dL — ABNORMAL HIGH (ref 0.61–1.24)
GFR calc Af Amer: >60 mL/min (ref >60)
GFR calc non Af Amer: >60 mL/min (ref >60)
Glucose, Bld: 144 mg/dL — ABNORMAL HIGH (ref 70–99)
Potassium: 5 mmol/L (ref 3.5–5.1)
Sodium: 148 mmol/L — ABNORMAL HIGH (ref 135–145)

## 2019-12-24 LAB — CBC
HCT: 28.9 % — ABNORMAL LOW (ref 39.0–52.0)
Hemoglobin: 8.2 g/dL — ABNORMAL LOW (ref 13.0–17.0)
MCH: 31.8 pg (ref 26.0–34.0)
MCHC: 28.4 g/dL — ABNORMAL LOW (ref 30.0–36.0)
MCV: 112 fL — ABNORMAL HIGH (ref 80.0–100.0)
Platelets: 103 10*3/uL — ABNORMAL LOW (ref 150–400)
RBC: 2.58 MIL/uL — ABNORMAL LOW (ref 4.22–5.81)
RDW: 24.2 % — ABNORMAL HIGH (ref 11.5–15.5)
WBC: 6.9 10*3/uL (ref 4.0–10.5)
nRBC: 0.7 % — ABNORMAL HIGH (ref 0.0–0.2)

## 2019-12-24 NOTE — Progress Notes (Signed)
Pulmonary Critical Care Medicine [facility] [place]   PULMONARY CRITICAL CARE SERVICE  PROGRESS NOTE  Date of Service: 12/24/2019  Rashun Grattan  HCW:[id number]  DOB: 15-Mar-1966   DOA: 12-18-2019  Referring Physician: Carron Curie, MD  HPI: Wyley Hack is a 54 y.o. male seen for follow up of Acute on Chronic Respiratory Failure.  Patient right now is on full support on pressure control mode has been on 55% FiO2 with a PEEP of 8 appears to be comfortable right now without distress  Medications: Reviewed on Rounds  Physical Exam:  Vitals: Temperature is 96.8 pulse 94 respiratory 28 blood pressure is 147/76 saturations 95%  Ventilator Settings on pressure assist control FiO2 is 55% tidal volume 461 PEEP 8  . General: Comfortable at this time . Eyes: Grossly normal lids, irises & conjunctiva . ENT: grossly tongue is normal . Neck: no obvious mass . Cardiovascular: S1 S2 normal no gallop . Respiratory: No rhonchi coarse breath sounds . Abdomen: soft . Skin: no rash seen on limited exam . Musculoskeletal: not rigid . Psychiatric:unable to assess . Neurologic: no seizure no involuntary movements         Lab Data:   Basic Metabolic Panel: Recent Labs  Lab 12/20/19 0619 12/20/19 0959 12/22/19 0558 12/23/19 1830 12/24/19 1225  NA 147* 146* 144 145 148*  K 4.2 4.2 4.7 5.0 5.0  CL 105 106 100 104 104  CO2 34* 37* 35* 31 32  GLUCOSE 119* 130* 156* 164* 144*  BUN 99* 106* 107* 120* 126*  CREATININE 1.19 1.21 1.29* 1.28* 1.30*  CALCIUM 8.2* 6.6* 8.3* 8.5* 8.6*    ABG: No results for input(s): PHART, PCO2ART, PO2ART, HCO3, O2SAT in the last 168 hours.  Liver Function Tests: No results for input(s): AST, ALT, ALKPHOS, BILITOT, PROT, ALBUMIN in the last 168 hours. No results for input(s): LIPASE, AMYLASE in the last 168 hours. No results for input(s): AMMONIA in the last 168 hours.  CBC: Recent Labs  Lab 12/18/19 0442 12/20/19 0619 12/22/19 0558  12/24/19 1225  WBC 9.5 9.0 6.5 6.9  HGB 8.3* 7.8* 6.9* 8.2*  HCT 29.5* 26.9* 23.3* 28.9*  MCV 112.2* 110.7* 109.9* 112.0*  PLT 108* 91* 92* 103*    Cardiac Enzymes: No results for input(s): CKTOTAL, CKMB, CKMBINDEX, TROPONINI in the last 168 hours.  BNP (last 3 results) No results for input(s): BNP in the last 8760 hours.  ProBNP (last 3 results) No results for input(s): PROBNP in the last 8760 hours.  Radiological Exams: No results found.  Assessment/Plan Active Problems:   Acute on chronic respiratory failure with hypoxia (HCC)   COVID-19 virus infection   Acute respiratory distress syndrome (ARDS) due to COVID-19 virus (HCC)   Pneumonia due to COVID-19 virus   Acute renal failure due to tubular necrosis (HCC)   1. Acute on chronic respiratory failure with hypoxia plan is to continue with full support on the ventilator his mechanics are extremely poor and patient is not able to tolerate any weaning. 2. COVID-19 virus infection at baseline we will continue with supportive care. 3. Acute respiratory distress at baseline we will continue to monitor 4. Pneumonia due to COVID-19 treated continue to follow 5. Acute renal failure with tubular necrosis following labs closely.   I have personally seen and evaluated the patient, evaluated laboratory and imaging results, formulated the assessment and plan and placed orders. The Patient requires high complexity decision making with multiple systems involvement.  Rounds were done with  the Respiratory Therapy Director and Staff therapists and discussed with nursing staff also.  Yevonne Pax, MD [facility] Pulmonary Critical Care Medicine Sleep Medicine

## 2019-12-25 LAB — BASIC METABOLIC PANEL
Anion gap: 9 (ref 5–15)
BUN: 132 mg/dL — ABNORMAL HIGH (ref 6–20)
CO2: 33 mmol/L — ABNORMAL HIGH (ref 22–32)
Calcium: 7 mg/dL — ABNORMAL LOW (ref 8.9–10.3)
Chloride: 108 mmol/L (ref 98–111)
Creatinine, Ser: 1.41 mg/dL — ABNORMAL HIGH (ref 0.61–1.24)
GFR calc Af Amer: >60 mL/min (ref >60)
GFR calc non Af Amer: 55 mL/min — ABNORMAL LOW (ref >60)
Glucose, Bld: 156 mg/dL — ABNORMAL HIGH (ref 70–99)
Potassium: 5.2 mmol/L — ABNORMAL HIGH (ref 3.5–5.1)
Sodium: 150 mmol/L — ABNORMAL HIGH (ref 135–145)

## 2019-12-26 DIAGNOSIS — U071 COVID-19: Secondary | ICD-10-CM | POA: Diagnosis not present

## 2019-12-26 DIAGNOSIS — J80 Acute respiratory distress syndrome: Secondary | ICD-10-CM | POA: Diagnosis not present

## 2019-12-26 DIAGNOSIS — J9621 Acute and chronic respiratory failure with hypoxia: Secondary | ICD-10-CM | POA: Diagnosis not present

## 2019-12-26 DIAGNOSIS — N170 Acute kidney failure with tubular necrosis: Secondary | ICD-10-CM | POA: Diagnosis not present

## 2019-12-26 LAB — BASIC METABOLIC PANEL
Anion gap: 5 (ref 5–15)
Anion gap: 11 (ref 5–15)
BUN: 129 mg/dL — ABNORMAL HIGH (ref 6–20)
BUN: 130 mg/dL — ABNORMAL HIGH (ref 6–20)
CO2: 38 mmol/L — ABNORMAL HIGH (ref 22–32)
CO2: 31 mmol/L (ref 22–32)
Calcium: 8.4 mg/dL — ABNORMAL LOW (ref 8.9–10.3)
Calcium: 8.1 mg/dL — ABNORMAL LOW (ref 8.9–10.3)
Chloride: 103 mmol/L (ref 98–111)
Chloride: 103 mmol/L (ref 98–111)
Creatinine, Ser: 1.48 mg/dL — ABNORMAL HIGH (ref 0.61–1.24)
Creatinine, Ser: 1.57 mg/dL — ABNORMAL HIGH (ref 0.61–1.24)
GFR calc Af Amer: >60 mL/min (ref >60)
GFR calc Af Amer: 56 mL/min — ABNORMAL LOW (ref >60)
GFR calc non Af Amer: 52 mL/min — ABNORMAL LOW (ref >60)
GFR calc non Af Amer: 49 mL/min — ABNORMAL LOW (ref >60)
Glucose, Bld: 208 mg/dL — ABNORMAL HIGH (ref 70–99)
Glucose, Bld: 206 mg/dL — ABNORMAL HIGH (ref 70–99)
Potassium: 5.3 mmol/L — ABNORMAL HIGH (ref 3.5–5.1)
Potassium: 6.2 mmol/L — ABNORMAL HIGH (ref 3.5–5.1)
Sodium: 146 mmol/L — ABNORMAL HIGH (ref 135–145)
Sodium: 145 mmol/L (ref 135–145)

## 2019-12-26 LAB — CULTURE, RESPIRATORY W GRAM STAIN: Gram Stain: NONE SEEN

## 2019-12-26 LAB — CBC
HCT: 29.5 % — ABNORMAL LOW (ref 39.0–52.0)
Hemoglobin: 8.1 g/dL — ABNORMAL LOW (ref 13.0–17.0)
MCH: 31.8 pg (ref 26.0–34.0)
MCHC: 27.5 g/dL — ABNORMAL LOW (ref 30.0–36.0)
MCV: 115.7 fL — ABNORMAL HIGH (ref 80.0–100.0)
Platelets: 111 10*3/uL — ABNORMAL LOW (ref 150–400)
RBC: 2.55 MIL/uL — ABNORMAL LOW (ref 4.22–5.81)
RDW: 23.8 % — ABNORMAL HIGH (ref 11.5–15.5)
WBC: 8.1 10*3/uL (ref 4.0–10.5)
nRBC: 0.9 % — ABNORMAL HIGH (ref 0.0–0.2)

## 2019-12-26 LAB — POTASSIUM: Potassium: 5.2 mmol/L — ABNORMAL HIGH (ref 3.5–5.1)

## 2019-12-26 NOTE — Progress Notes (Signed)
Pulmonary Critical Care Medicine [facility] [place]   PULMONARY CRITICAL CARE SERVICE  PROGRESS NOTE  Date of Service: 12/26/2019  Danny Leach  ELF:[id number]  DOB: 05/19/1966   DOA: 2019/12/14  Referring Physician: Carron Curie, MD  HPI: Danny Leach is a 54 y.o. male seen for follow up of Acute on Chronic Respiratory Failure.  Patient right now is on full support on the ventilator has not been tolerating any weaning attempts.  Medications: Reviewed on Rounds  Physical Exam:  Vitals: Temperature is 96.2 pulse 77 respiratory rate is 36 blood pressure is 104/60 saturations 95%  Ventilator Settings on the ventilator on pressure assist control with an FiO2 of 50% IP 34 PEEP 8  . General: Comfortable at this time . Eyes: Grossly normal lids, irises & conjunctiva . ENT: grossly tongue is normal . Neck: no obvious mass . Cardiovascular: S1 S2 normal no gallop . Respiratory: Coarse breath sounds few scattered rhonchi are noted . Abdomen: soft . Skin: no rash seen on limited exam . Musculoskeletal: not rigid . Psychiatric:unable to assess . Neurologic: no seizure no involuntary movements         Lab Data:   Basic Metabolic Panel: Recent Labs  Lab 12/23/19 1830 12/24/19 1225 12/25/19 0802 12/26/19 0609 12/26/19 1029  NA 145 148* 150* 146* 145  K 5.0 5.0 5.2* 5.3* 6.2*  CL 104 104 108 103 103  CO2 31 32 33* 38* 31  GLUCOSE 164* 144* 156* 208* 206*  BUN 120* 126* 132* 129* 130*  CREATININE 1.28* 1.30* 1.41* 1.48* 1.57*  CALCIUM 8.5* 8.6* 7.0* 8.4* 8.1*    ABG: No results for input(s): PHART, PCO2ART, PO2ART, HCO3, O2SAT in the last 168 hours.  Liver Function Tests: No results for input(s): AST, ALT, ALKPHOS, BILITOT, PROT, ALBUMIN in the last 168 hours. No results for input(s): LIPASE, AMYLASE in the last 168 hours. No results for input(s): AMMONIA in the last 168 hours.  CBC: Recent Labs  Lab 12/20/19 0619 12/22/19 0558 12/24/19 1225  12/26/19 0609  WBC 9.0 6.5 6.9 8.1  HGB 7.8* 6.9* 8.2* 8.1*  HCT 26.9* 23.3* 28.9* 29.5*  MCV 110.7* 109.9* 112.0* 115.7*  PLT 91* 92* 103* 111*    Cardiac Enzymes: No results for input(s): CKTOTAL, CKMB, CKMBINDEX, TROPONINI in the last 168 hours.  BNP (last 3 results) No results for input(s): BNP in the last 8760 hours.  ProBNP (last 3 results) No results for input(s): PROBNP in the last 8760 hours.  Radiological Exams: No results found.  Assessment/Plan Active Problems:   Acute on chronic respiratory failure with hypoxia (HCC)   COVID-19 virus infection   Acute respiratory distress syndrome (ARDS) due to COVID-19 virus (HCC)   Pneumonia due to COVID-19 virus   Acute renal failure due to tubular necrosis (HCC)   1. Acute on chronic respiratory failure hypoxia plan is going to be to continue with full support on the ventilator.  Continue pulmonary toilet supportive care. 2. COVID-19 virus infection treated we will continue to follow 3. ARDS at baseline we will continue present management 4. Pneumonia due to COVID-19 treated 5. Acute renal failure tubular necrosis at baseline continue to monitor closely   I have personally seen and evaluated the patient, evaluated laboratory and imaging results, formulated the assessment and plan and placed orders. The Patient requires high complexity decision making with multiple systems involvement.  Rounds were done with the Respiratory Therapy Director and Staff therapists and discussed with nursing staff also.  Allyne Gee, MD [facility] Pulmonary Critical Care Medicine Sleep Medicine

## 2019-12-27 DIAGNOSIS — U071 COVID-19: Secondary | ICD-10-CM | POA: Diagnosis not present

## 2019-12-27 DIAGNOSIS — J80 Acute respiratory distress syndrome: Secondary | ICD-10-CM | POA: Diagnosis not present

## 2019-12-27 DIAGNOSIS — N170 Acute kidney failure with tubular necrosis: Secondary | ICD-10-CM | POA: Diagnosis not present

## 2019-12-27 DIAGNOSIS — J9621 Acute and chronic respiratory failure with hypoxia: Secondary | ICD-10-CM | POA: Diagnosis not present

## 2019-12-27 LAB — BASIC METABOLIC PANEL
Anion gap: 9 (ref 5–15)
BUN: 152 mg/dL — ABNORMAL HIGH (ref 6–20)
CO2: 35 mmol/L — ABNORMAL HIGH (ref 22–32)
Calcium: 8.5 mg/dL — ABNORMAL LOW (ref 8.9–10.3)
Chloride: 102 mmol/L (ref 98–111)
Creatinine, Ser: 1.84 mg/dL — ABNORMAL HIGH (ref 0.61–1.24)
GFR calc Af Amer: 46 mL/min — ABNORMAL LOW (ref >60)
GFR calc non Af Amer: 40 mL/min — ABNORMAL LOW (ref >60)
Glucose, Bld: 177 mg/dL — ABNORMAL HIGH (ref 70–99)
Potassium: 4.7 mmol/L (ref 3.5–5.1)
Sodium: 146 mmol/L — ABNORMAL HIGH (ref 135–145)

## 2019-12-27 LAB — POTASSIUM: Potassium: 5.7 mmol/L — ABNORMAL HIGH (ref 3.5–5.1)

## 2019-12-27 NOTE — Progress Notes (Signed)
Pulmonary Critical Care Medicine [facility] [place]   PULMONARY CRITICAL CARE SERVICE  PROGRESS NOTE  Date of Service: 12/27/2019  Danny Leach  MHD:[id number]  DOB: 08/08/1965   DOA: 12/17/2019  Referring Physician: Carron Curie, MD  HPI: Danny Leach is a 54 y.o. male seen for follow up of Acute on Chronic Respiratory Failure.  Patient is on the ventilator not tolerating any weaning.  Right now remains on pressure control mode  Medications: Reviewed on Rounds  Physical Exam:  Vitals: Temperature 96.8 pulse 95 respiratory 39 blood pressure 166/80 saturations 97%  Ventilator Settings on pressure assist control FiO2 45% PEEP 8 tidal volume 529  . General: Comfortable at this time . Eyes: Grossly normal lids, irises & conjunctiva . ENT: grossly tongue is normal . Neck: no obvious mass . Cardiovascular: S1 S2 normal no gallop . Respiratory: No rhonchi no rales are noted . Abdomen: soft . Skin: no rash seen on limited exam . Musculoskeletal: not rigid . Psychiatric:unable to assess . Neurologic: no seizure no involuntary movements         Lab Data:   Basic Metabolic Panel: Recent Labs  Lab 12/24/19 1225 12/24/19 1225 12/25/19 0802 12/26/19 0609 12/26/19 1029 12/26/19 1810 12/27/19 1133  NA 148*  --  150* 146* 145  --  146*  K 5.0   < > 5.2* 5.3* 6.2* 5.2* 4.7  CL 104  --  108 103 103  --  102  CO2 32  --  33* 38* 31  --  35*  GLUCOSE 144*  --  156* 208* 206*  --  177*  BUN 126*  --  132* 129* 130*  --  152*  CREATININE 1.30*  --  1.41* 1.48* 1.57*  --  1.84*  CALCIUM 8.6*  --  7.0* 8.4* 8.1*  --  8.5*   < > = values in this interval not displayed.    ABG: No results for input(s): PHART, PCO2ART, PO2ART, HCO3, O2SAT in the last 168 hours.  Liver Function Tests: No results for input(s): AST, ALT, ALKPHOS, BILITOT, PROT, ALBUMIN in the last 168 hours. No results for input(s): LIPASE, AMYLASE in the last 168 hours. No results for input(s):  AMMONIA in the last 168 hours.  CBC: Recent Labs  Lab 12/22/19 0558 12/24/19 1225 12/26/19 0609  WBC 6.5 6.9 8.1  HGB 6.9* 8.2* 8.1*  HCT 23.3* 28.9* 29.5*  MCV 109.9* 112.0* 115.7*  PLT 92* 103* 111*    Cardiac Enzymes: No results for input(s): CKTOTAL, CKMB, CKMBINDEX, TROPONINI in the last 168 hours.  BNP (last 3 results) No results for input(s): BNP in the last 8760 hours.  ProBNP (last 3 results) No results for input(s): PROBNP in the last 8760 hours.  Radiological Exams: No results found.  Assessment/Plan Active Problems:   Acute on chronic respiratory failure with hypoxia (HCC)   COVID-19 virus infection   Acute respiratory distress syndrome (ARDS) due to COVID-19 virus (HCC)   Pneumonia due to COVID-19 virus   Acute renal failure due to tubular necrosis (HCC)   1. Acute on chronic respiratory failure hypoxia plan continue with full support on the ventilator little bit more awake not tolerating weaning still 2. COVID-19 virus infection treated in resolution 3. ARDS slow improvement pneumonia due to COVID-19 has been treated 4. Acute renal failure following labs   I have personally seen and evaluated the patient, evaluated laboratory and imaging results, formulated the assessment and plan and placed orders. The Patient  requires high complexity decision making with multiple systems involvement.  Rounds were done with the Respiratory Therapy Director and Staff therapists and discussed with nursing staff also.  Allyne Gee, MD [facility] Pulmonary Critical Care Medicine Sleep Medicine

## 2019-12-28 DIAGNOSIS — J80 Acute respiratory distress syndrome: Secondary | ICD-10-CM | POA: Diagnosis not present

## 2019-12-28 DIAGNOSIS — N170 Acute kidney failure with tubular necrosis: Secondary | ICD-10-CM | POA: Diagnosis not present

## 2019-12-28 DIAGNOSIS — J9621 Acute and chronic respiratory failure with hypoxia: Secondary | ICD-10-CM | POA: Diagnosis not present

## 2019-12-28 DIAGNOSIS — U071 COVID-19: Secondary | ICD-10-CM | POA: Diagnosis not present

## 2019-12-28 LAB — BASIC METABOLIC PANEL
Anion gap: 10 (ref 5–15)
BUN: 162 mg/dL — ABNORMAL HIGH (ref 6–20)
CO2: 34 mmol/L — ABNORMAL HIGH (ref 22–32)
Calcium: 8.4 mg/dL — ABNORMAL LOW (ref 8.9–10.3)
Chloride: 101 mmol/L (ref 98–111)
Creatinine, Ser: 1.97 mg/dL — ABNORMAL HIGH (ref 0.61–1.24)
GFR calc Af Amer: 43 mL/min — ABNORMAL LOW (ref >60)
GFR calc non Af Amer: 37 mL/min — ABNORMAL LOW (ref >60)
Glucose, Bld: 177 mg/dL — ABNORMAL HIGH (ref 70–99)
Potassium: 4.8 mmol/L (ref 3.5–5.1)
Sodium: 145 mmol/L (ref 135–145)

## 2019-12-28 LAB — MAGNESIUM: Magnesium: 2.7 mg/dL — ABNORMAL HIGH (ref 1.7–2.4)

## 2019-12-28 NOTE — Progress Notes (Addendum)
Pulmonary Critical Care Medicine [facility] [place]   PULMONARY CRITICAL CARE SERVICE  PROGRESS NOTE  Date of Service: 12/28/2019  Danny Leach  JJH:[id number]  DOB: 1966/01/25   DOA: 11/20/2019  Referring Physician: Carron Curie, MD  HPI: Danny Leach is a 54 y.o. male seen for follow up of Acute on Chronic Respiratory Failure.  Patient failed wean today remains on pressure control rate of 35 and FiO2 45% pulling good volumes at 480.  Satting well no distress.  Medications: Reviewed on Rounds  Physical Exam:  Vitals: Pulse 103 respirations 36 BP 144/73 O2 sat 91% temp 96.1  Ventilator Settings ventilator mode AC PC rate of 35 respiratory pressure 24 PEEP of 8 with an FiO2 of 45%  . General: Comfortable at this time . Eyes: Grossly normal lids, irises & conjunctiva . ENT: grossly tongue is normal . Neck: no obvious mass . Cardiovascular: S1 S2 normal no gallop . Respiratory: No rales or rhonchi noted . Abdomen: soft . Skin: no rash seen on limited exam . Musculoskeletal: not rigid . Psychiatric:unable to assess . Neurologic: no seizure no involuntary movements         Lab Data:   Basic Metabolic Panel: Recent Labs  Lab 12/25/19 0802 12/25/19 0802 12/26/19 0609 12/26/19 0609 12/26/19 1029 12/26/19 1810 12/27/19 1133 12/27/19 2130 12/28/19 0827  NA 150*  --  146*  --  145  --  146*  --  145  K 5.2*   < > 5.3*   < > 6.2* 5.2* 4.7 5.7* 4.8  CL 108  --  103  --  103  --  102  --  101  CO2 33*  --  38*  --  31  --  35*  --  34*  GLUCOSE 156*  --  208*  --  206*  --  177*  --  177*  BUN 132*  --  129*  --  130*  --  152*  --  162*  CREATININE 1.41*  --  1.48*  --  1.57*  --  1.84*  --  1.97*  CALCIUM 7.0*  --  8.4*  --  8.1*  --  8.5*  --  8.4*  MG  --   --   --   --   --   --   --   --  2.7*   < > = values in this interval not displayed.    ABG: No results for input(s): PHART, PCO2ART, PO2ART, HCO3, O2SAT in the last 168 hours.  Liver  Function Tests: No results for input(s): AST, ALT, ALKPHOS, BILITOT, PROT, ALBUMIN in the last 168 hours. No results for input(s): LIPASE, AMYLASE in the last 168 hours. No results for input(s): AMMONIA in the last 168 hours.  CBC: Recent Labs  Lab 12/22/19 0558 12/24/19 1225 12/26/19 0609  WBC 6.5 6.9 8.1  HGB 6.9* 8.2* 8.1*  HCT 23.3* 28.9* 29.5*  MCV 109.9* 112.0* 115.7*  PLT 92* 103* 111*    Cardiac Enzymes: No results for input(s): CKTOTAL, CKMB, CKMBINDEX, TROPONINI in the last 168 hours.  BNP (last 3 results) No results for input(s): BNP in the last 8760 hours.  ProBNP (last 3 results) No results for input(s): PROBNP in the last 8760 hours.  Radiological Exams: No results found.  Assessment/Plan Active Problems:   Acute on chronic respiratory failure with hypoxia (HCC)   COVID-19 virus infection   Acute respiratory distress syndrome (ARDS) due to COVID-19 virus (HCC)  Pneumonia due to COVID-19 virus   Acute renal failure due to tubular necrosis (HCC)   1. Acute on chronic respiratory failure hypoxia we will continue full support at this time and will continue to attempt weaning as patient can tolerate continue supportive measures and pulmonary toilet. 2. COVID-19 virus infection treated in resolution 3. ARDS slow improvement pneumonia due to COVID-19 has been treated 4. Acute renal failure following labs   I have personally seen and evaluated the patient, evaluated laboratory and imaging results, formulated the assessment and plan and placed orders. The Patient requires high complexity decision making with multiple systems involvement.  Rounds were done with the Respiratory Therapy Director and Staff therapists and discussed with nursing staff also.  Yevonne Pax, MD [facility] Pulmonary Critical Care Medicine Sleep Medicine

## 2019-12-29 ENCOUNTER — Other Ambulatory Visit (HOSPITAL_COMMUNITY): Payer: BLUE CROSS/BLUE SHIELD

## 2019-12-29 DIAGNOSIS — N170 Acute kidney failure with tubular necrosis: Secondary | ICD-10-CM | POA: Diagnosis not present

## 2019-12-29 DIAGNOSIS — J80 Acute respiratory distress syndrome: Secondary | ICD-10-CM | POA: Diagnosis not present

## 2019-12-29 DIAGNOSIS — J9621 Acute and chronic respiratory failure with hypoxia: Secondary | ICD-10-CM | POA: Diagnosis not present

## 2019-12-29 DIAGNOSIS — U071 COVID-19: Secondary | ICD-10-CM | POA: Diagnosis not present

## 2019-12-29 LAB — BASIC METABOLIC PANEL
Anion gap: 9 (ref 5–15)
BUN: 161 mg/dL — ABNORMAL HIGH (ref 6–20)
CO2: 36 mmol/L — ABNORMAL HIGH (ref 22–32)
Calcium: 8.4 mg/dL — ABNORMAL LOW (ref 8.9–10.3)
Chloride: 99 mmol/L (ref 98–111)
Creatinine, Ser: 2.17 mg/dL — ABNORMAL HIGH (ref 0.61–1.24)
GFR calc Af Amer: 38 mL/min — ABNORMAL LOW (ref >60)
GFR calc non Af Amer: 33 mL/min — ABNORMAL LOW (ref >60)
Glucose, Bld: 139 mg/dL — ABNORMAL HIGH (ref 70–99)
Potassium: 5.2 mmol/L — ABNORMAL HIGH (ref 3.5–5.1)
Sodium: 144 mmol/L (ref 135–145)

## 2019-12-29 NOTE — Progress Notes (Signed)
Pulmonary Critical Care Medicine [facility] [place]   PULMONARY CRITICAL CARE SERVICE  PROGRESS NOTE  Date of Service: 12/29/2019  Danny Leach  CLE:[id number]  DOB: 1966-02-25   DOA: 11/30/2019  Referring Physician: Carron Curie, MD  HPI: Danny Leach is a 54 y.o. male seen for follow up of Acute on Chronic Respiratory Failure.  Patient currently is on pressure control mode consistently failing attempts at weaning.  Currently FiO2 requirements are at 60%  Medications: Reviewed on Rounds  Physical Exam:  Vitals: Temperature 97.1 pulse 100 respiratory 35 blood pressure is 127/73 saturations 96%  Ventilator Settings on pressure assist control FiO2 60% tidal volume 442 PEEP 8 IP 24  . General: Comfortable at this time . Eyes: Grossly normal lids, irises & conjunctiva . ENT: grossly tongue is normal . Neck: no obvious mass . Cardiovascular: S1 S2 normal no gallop . Respiratory: No rhonchi coarse breath sounds . Abdomen: soft . Skin: no rash seen on limited exam . Musculoskeletal: not rigid . Psychiatric:unable to assess . Neurologic: no seizure no involuntary movements         Lab Data:   Basic Metabolic Panel: Recent Labs  Lab 12/26/19 0609 12/26/19 0609 12/26/19 1029 12/26/19 1029 12/26/19 1810 12/27/19 1133 12/27/19 2130 12/28/19 0827 12/29/19 1005  NA 146*  --  145  --   --  146*  --  145 144  K 5.3*   < > 6.2*   < > 5.2* 4.7 5.7* 4.8 5.2*  CL 103  --  103  --   --  102  --  101 99  CO2 38*  --  31  --   --  35*  --  34* 36*  GLUCOSE 208*  --  206*  --   --  177*  --  177* 139*  BUN 129*  --  130*  --   --  152*  --  162* 161*  CREATININE 1.48*  --  1.57*  --   --  1.84*  --  1.97* 2.17*  CALCIUM 8.4*  --  8.1*  --   --  8.5*  --  8.4* 8.4*  MG  --   --   --   --   --   --   --  2.7*  --    < > = values in this interval not displayed.    ABG: No results for input(s): PHART, PCO2ART, PO2ART, HCO3, O2SAT in the last 168 hours.  Liver  Function Tests: No results for input(s): AST, ALT, ALKPHOS, BILITOT, PROT, ALBUMIN in the last 168 hours. No results for input(s): LIPASE, AMYLASE in the last 168 hours. No results for input(s): AMMONIA in the last 168 hours.  CBC: Recent Labs  Lab 12/24/19 1225 12/26/19 0609  WBC 6.9 8.1  HGB 8.2* 8.1*  HCT 28.9* 29.5*  MCV 112.0* 115.7*  PLT 103* 111*    Cardiac Enzymes: No results for input(s): CKTOTAL, CKMB, CKMBINDEX, TROPONINI in the last 168 hours.  BNP (last 3 results) No results for input(s): BNP in the last 8760 hours.  ProBNP (last 3 results) No results for input(s): PROBNP in the last 8760 hours.  Radiological Exams: DG Chest Port 1 View  Result Date: 12/29/2019 CLINICAL DATA:  Respiratory failure.  COVID 19. EXAM: PORTABLE CHEST 1 VIEW COMPARISON:  One-view chest x-ray 12/18/2019.  CTA chest 12/10/2019 FINDINGS: Heart and this is scratched at the heart is enlarged. Tracheostomy tube is in satisfactory position. Diffuse  interstitial and airspace opacities have increased. Bilateral effusions are present. No pneumothorax is present. IMPRESSION: 1. Increasing interstitial and airspace disease compatible with multifocal pneumonia versus ARDS. 2. Bilateral pleural effusions. Electronically Signed   By: Marin Roberts M.D.   On: 12/29/2019 07:12    Assessment/Plan Active Problems:   Acute on chronic respiratory failure with hypoxia (HCC)   COVID-19 virus infection   Acute respiratory distress syndrome (ARDS) due to COVID-19 virus (HCC)   Pneumonia due to COVID-19 virus   Acute renal failure due to tubular necrosis (HCC)   1. Acute on chronic respiratory failure hypoxia plan is to continue with full support on pressure control mode titrate oxygen continue pulmonary toilet. 2. Virus infection with COVID-19 supportive care and resolution 3. ARDS due to COVID-19 in resolution 4. Pneumonia due to COVID-19 resolved 5. Acute renal failure with tubular necrosis we  will continue to monitor.   I have personally seen and evaluated the patient, evaluated laboratory and imaging results, formulated the assessment and plan and placed orders. The Patient requires high complexity decision making with multiple systems involvement.  Rounds were done with the Respiratory Therapy Director and Staff therapists and discussed with nursing staff also.  Yevonne Pax, MD [facility] Pulmonary Critical Care Medicine Sleep Medicine

## 2019-12-30 ENCOUNTER — Other Ambulatory Visit (HOSPITAL_COMMUNITY): Payer: BLUE CROSS/BLUE SHIELD

## 2019-12-30 DIAGNOSIS — J80 Acute respiratory distress syndrome: Secondary | ICD-10-CM | POA: Diagnosis not present

## 2019-12-30 DIAGNOSIS — U071 COVID-19: Secondary | ICD-10-CM | POA: Diagnosis not present

## 2019-12-30 DIAGNOSIS — N170 Acute kidney failure with tubular necrosis: Secondary | ICD-10-CM | POA: Diagnosis not present

## 2019-12-30 DIAGNOSIS — J9621 Acute and chronic respiratory failure with hypoxia: Secondary | ICD-10-CM | POA: Diagnosis not present

## 2019-12-30 HISTORY — PX: IR US GUIDE VASC ACCESS RIGHT: IMG2390

## 2019-12-30 HISTORY — PX: IR FLUORO GUIDE CV LINE RIGHT: IMG2283

## 2019-12-30 LAB — HEPATITIS B SURFACE ANTIGEN: Hepatitis B Surface Ag: NONREACTIVE

## 2019-12-30 LAB — BASIC METABOLIC PANEL
Anion gap: 8 (ref 5–15)
BUN: 180 mg/dL — ABNORMAL HIGH (ref 6–20)
CO2: 35 mmol/L — ABNORMAL HIGH (ref 22–32)
Calcium: 8.2 mg/dL — ABNORMAL LOW (ref 8.9–10.3)
Chloride: 99 mmol/L (ref 98–111)
Creatinine, Ser: 2.38 mg/dL — ABNORMAL HIGH (ref 0.61–1.24)
GFR calc Af Amer: 34 mL/min — ABNORMAL LOW (ref >60)
GFR calc non Af Amer: 29 mL/min — ABNORMAL LOW (ref >60)
Glucose, Bld: 203 mg/dL — ABNORMAL HIGH (ref 70–99)
Potassium: 5.6 mmol/L — ABNORMAL HIGH (ref 3.5–5.1)
Sodium: 142 mmol/L (ref 135–145)

## 2019-12-30 LAB — CBC
HCT: 26.3 % — ABNORMAL LOW (ref 39.0–52.0)
Hemoglobin: 7.5 g/dL — ABNORMAL LOW (ref 13.0–17.0)
MCH: 33.2 pg (ref 26.0–34.0)
MCHC: 28.5 g/dL — ABNORMAL LOW (ref 30.0–36.0)
MCV: 116.4 fL — ABNORMAL HIGH (ref 80.0–100.0)
Platelets: 103 10*3/uL — ABNORMAL LOW (ref 150–400)
RBC: 2.26 MIL/uL — ABNORMAL LOW (ref 4.22–5.81)
RDW: 23.9 % — ABNORMAL HIGH (ref 11.5–15.5)
WBC: 13.4 10*3/uL — ABNORMAL HIGH (ref 4.0–10.5)
nRBC: 0.8 % — ABNORMAL HIGH (ref 0.0–0.2)

## 2019-12-30 LAB — PROTIME-INR
INR: 1.2 (ref 0.8–1.2)
Prothrombin Time: 14.3 seconds (ref 11.4–15.2)

## 2019-12-30 MED ORDER — LIDOCAINE HCL (PF) 1 % IJ SOLN
INTRAMUSCULAR | Status: AC | PRN
  Administered 2019-12-30: 10 mL

## 2019-12-30 NOTE — Progress Notes (Addendum)
Pulmonary Critical Care Medicine [facility] [place]   PULMONARY CRITICAL CARE SERVICE  PROGRESS NOTE  Date of Service: 12/30/2019  Danny Leach  WNU:[id number]  DOB: 07-Jun-1965   DOA: 11/20/2019  Referring Physician: Carron Curie, MD  HPI: Danny Leach is a 54 y.o. male seen for follow up of Acute on Chronic Respiratory Failure.  Patient remains on full support assist-control pressure control mode rate of 35 with an FiO2 of 60% currently satting well no distress.  Medications: Reviewed on Rounds  Physical Exam:  Vitals: Pulse 90 respirations 10 BP 90/49 O2 sat 98% temp 97.5  Ventilator Settings ventilator mode AC PC rate of 35 respiratory pressure 24 PEEP of 8 with an FiO2 of 60%  . General: Comfortable at this time . Eyes: Grossly normal lids, irises & conjunctiva . ENT: grossly tongue is normal . Neck: no obvious mass . Cardiovascular: S1 S2 normal no gallop . Respiratory: Coarse breath sounds . Abdomen: soft . Skin: no rash seen on limited exam . Musculoskeletal: not rigid . Psychiatric:unable to assess . Neurologic: no seizure no involuntary movements         Lab Data:   Basic Metabolic Panel: Recent Labs  Lab 12/26/19 1029 12/26/19 1810 12/27/19 1133 12/27/19 2130 12/28/19 0827 12/29/19 1005 12/30/19 0452  NA 145  --  146*  --  145 144 142  K 6.2*   < > 4.7 5.7* 4.8 5.2* 5.6*  CL 103  --  102  --  101 99 99  CO2 31  --  35*  --  34* 36* 35*  GLUCOSE 206*  --  177*  --  177* 139* 203*  BUN 130*  --  152*  --  162* 161* 180*  CREATININE 1.57*  --  1.84*  --  1.97* 2.17* 2.38*  CALCIUM 8.1*  --  8.5*  --  8.4* 8.4* 8.2*  MG  --   --   --   --  2.7*  --   --    < > = values in this interval not displayed.    ABG: No results for input(s): PHART, PCO2ART, PO2ART, HCO3, O2SAT in the last 168 hours.  Liver Function Tests: No results for input(s): AST, ALT, ALKPHOS, BILITOT, PROT, ALBUMIN in the last 168 hours. No results for input(s):  LIPASE, AMYLASE in the last 168 hours. No results for input(s): AMMONIA in the last 168 hours.  CBC: Recent Labs  Lab 12/24/19 1225 12/26/19 0609 12/30/19 0452  WBC 6.9 8.1 13.4*  HGB 8.2* 8.1* 7.5*  HCT 28.9* 29.5* 26.3*  MCV 112.0* 115.7* 116.4*  PLT 103* 111* 103*    Cardiac Enzymes: No results for input(s): CKTOTAL, CKMB, CKMBINDEX, TROPONINI in the last 168 hours.  BNP (last 3 results) No results for input(s): BNP in the last 8760 hours.  ProBNP (last 3 results) No results for input(s): PROBNP in the last 8760 hours.  Radiological Exams: IR Fluoro Guide CV Line Right  Result Date: 12/30/2019 CLINICAL DATA:  Acute kidney injury and need for non tunneled hemodialysis catheter to begin dialysis. EXAM: NON-TUNNELED CENTRAL VENOUS HEMODIALYSIS CATHETER PLACEMENT WITH ULTRASOUND AND FLUOROSCOPIC GUIDANCE FLUOROSCOPY TIME:  26 seconds.  5.0 mGy. PROCEDURE: The procedure, risks, benefits, and alternatives were explained to the patient's wife. Questions regarding the procedure were encouraged and answered. The patient's wife understands and consents to the procedure. A time-out was performed prior to initiating the procedure. The right neck and chest were prepped with chlorhexidine in  a sterile fashion, and a sterile drape was applied covering the operative field. Maximum barrier sterile technique with sterile gowns and gloves were used for the procedure. Local anesthesia was provided with 1% lidocaine. Ultrasound was used to confirm patency of the right internal jugular vein. After creating a small venotomy incision, a 21 gauge needle was advanced into the right internal jugular vein under direct, real-time ultrasound guidance. Ultrasound image documentation was performed. Venous access was dilated over a guidewire. A 20 cm, triple lumen, 13 French Mahurkar catheter was placed over the wire. Final catheter positioning was confirmed and documented with a fluoroscopic spot image. The catheter  was aspirated, flushed with saline, and injected with appropriate volume heparin dwells. The catheter exit site was secured with 0-Prolene retention sutures. COMPLICATIONS: None.  No pneumothorax. FINDINGS: After catheter placement, the tip lies at the cavoatrial junction. The catheter aspirates normally and is ready for immediate use. IMPRESSION: Placement of non-tunneled central venous hemodialysis catheter via the right internal jugular vein. The catheter tip lies at the cavoatrial junction. The catheter is ready for immediate use. Electronically Signed   By: Irish Lack M.D.   On: 12/30/2019 14:03   IR US Guide Vasc Access Right  Result Date: 12/30/2019 CLINICAL DATA:  Acute kidney injury and need for non tunneled hemodialysis catheter to begin dialysis. EXAM: NON-TUNNELED CENTRAL VENOUS HEMODIALYSIS CATHETER PLACEMENT WITH ULTRASOUND AND FLUOROSCOPIC GUIDANCE FLUOROSCOPY TIME:  26 seconds.  5.0 mGy. PROCEDURE: The procedure, risks, benefits, and alternatives were explained to the patient's wife. Questions regarding the procedure were encouraged and answered. The patient's wife understands and consents to the procedure. A time-out was performed prior to initiating the procedure. The right neck and chest were prepped with chlorhexidine in a sterile fashion, and a sterile drape was applied covering the operative field. Maximum barrier sterile technique with sterile gowns and gloves were used for the procedure. Local anesthesia was provided with 1% lidocaine. Ultrasound was used to confirm patency of the right internal jugular vein. After creating a small venotomy incision, a 21 gauge needle was advanced into the right internal jugular vein under direct, real-time ultrasound guidance. Ultrasound image documentation was performed. Venous access was dilated over a guidewire. A 20 cm, triple lumen, 13 French Mahurkar catheter was placed over the wire. Final catheter positioning was confirmed and documented  with a fluoroscopic spot image. The catheter was aspirated, flushed with saline, and injected with appropriate volume heparin dwells. The catheter exit site was secured with 0-Prolene retention sutures. COMPLICATIONS: None.  No pneumothorax. FINDINGS: After catheter placement, the tip lies at the cavoatrial junction. The catheter aspirates normally and is ready for immediate use. IMPRESSION: Placement of non-tunneled central venous hemodialysis catheter via the right internal jugular vein. The catheter tip lies at the cavoatrial junction. The catheter is ready for immediate use. Electronically Signed   By: Irish Lack M.D.   On: 12/30/2019 14:03   DG Chest Port 1 View  Result Date: 12/29/2019 CLINICAL DATA:  Respiratory failure.  COVID 19. EXAM: PORTABLE CHEST 1 VIEW COMPARISON:  One-view chest x-ray 12/18/2019.  CTA chest 12/10/2019 FINDINGS: Heart and this is scratched at the heart is enlarged. Tracheostomy tube is in satisfactory position. Diffuse interstitial and airspace opacities have increased. Bilateral effusions are present. No pneumothorax is present. IMPRESSION: 1. Increasing interstitial and airspace disease compatible with multifocal pneumonia versus ARDS. 2. Bilateral pleural effusions. Electronically Signed   By: Marin Roberts M.D.   On: 12/29/2019 07:12    Assessment/Plan  Active Problems:   Acute on chronic respiratory failure with hypoxia (HCC)   COVID-19 virus infection   Acute respiratory distress syndrome (ARDS) due to COVID-19 virus (HCC)   Pneumonia due to COVID-19 virus   Acute renal failure due to tubular necrosis (HCC)   1. Acute on chronic respiratory failure hypoxia plan is to continue with full support on pressure control mode titrate oxygen continue aggressive pulmonary toilet. 2. Virus infection with COVID-19 supportive care and resolution 3. ARDS due to COVID-19 in resolution 4. Pneumonia due to COVID-19 resolved 5. Acute renal failure with tubular  necrosis we will continue to monitor.   I have personally seen and evaluated the patient, evaluated laboratory and imaging results, formulated the assessment and plan and placed orders. The Patient requires high complexity decision making with multiple systems involvement.  Rounds were done with the Respiratory Therapy Director and Staff therapists and discussed with nursing staff also.  Yevonne Pax, MD [facility] Pulmonary Critical Care Medicine Sleep Medicine

## 2019-12-30 NOTE — Procedures (Signed)
Interventional Radiology Procedure Note  Procedure: Right IJ non-tunneled HD catheter placement  Complications: None  Estimated Blood Loss: < 10 mL  Findings: 20 cm, 13 Fr triple lumen Mahurkar non-tunneled HD cath placed via right IJ vein. Tip in RA. OK to use.  Jodi Marble. Fredia Sorrow, M.D Pager:  [phone]

## 2019-12-30 NOTE — Progress Notes (Signed)
[facility]  ROUNDING NOTE   Subjective:  Asked to see this patient back in follow-up. BUN now up to 180 with a creatinine of 2.3. Potassium also high at 5.6. Urine output only 700 cc over the preceding 24 hours. Significant volume overload noted.   Objective:  Vital signs in last 24 hours:  Temperature 97 pulse 90 respirations 34 blood pressure 90/49  Physical Exam: General: Critically ill-appearing  Head: Normocephalic, atraumatic. Moist oral mucosal membranes  Eyes: Anicteric  Neck: Tracheostomy in place  Lungs:  Bilateral rhonchi, vent assisted  Heart: S1S2 no rubs  Abdomen:  Soft, nontender, bowel sounds present  Extremities: 3+ generalized edema  Neurologic: Awake, alert, on ventilator  Skin: No lesions       Basic Metabolic Panel: Recent Labs  Lab 12/26/19 1029 12/26/19 1810 12/27/19 1133 12/27/19 1133 12/27/19 2130 12/28/19 0827 12/29/19 1005 12/30/19 0452  NA 145  --  146*  --   --  145 144 142  K 6.2*   < > 4.7  --  5.7* 4.8 5.2* 5.6*  CL 103  --  102  --   --  101 99 99  CO2 31  --  35*  --   --  34* 36* 35*  GLUCOSE 206*  --  177*  --   --  177* 139* 203*  BUN 130*  --  152*  --   --  162* 161* 180*  CREATININE 1.57*  --  1.84*  --   --  1.97* 2.17* 2.38*  CALCIUM 8.1*  --  8.5*   < >  --  8.4* 8.4* 8.2*  MG  --   --   --   --   --  2.7*  --   --    < > = values in this interval not displayed.    Liver Function Tests: No results for input(s): AST, ALT, ALKPHOS, BILITOT, PROT, ALBUMIN in the last 168 hours. No results for input(s): LIPASE, AMYLASE in the last 168 hours. No results for input(s): AMMONIA in the last 168 hours.  CBC: Recent Labs  Lab 12/24/19 1225 12/26/19 0609 12/30/19 0452  WBC 6.9 8.1 13.4*  HGB 8.2* 8.1* 7.5*  HCT 28.9* 29.5* 26.3*  MCV 112.0* 115.7* 116.4*  PLT 103* 111* 103*    Cardiac Enzymes: No results for input(s): CKTOTAL, CKMB, CKMBINDEX, TROPONINI in the last 168 hours.  BNP: Invalid  input(s): POCBNP  CBG: No results for input(s): GLUCAP in the last 168 hours.  Microbiology: Results for orders placed or performed during the hospital encounter of 12/12/2019  Culture, respiratory     Status: None   Collection Time: 11/29/19  9:21 AM   Specimen: Tracheal Aspirate; Respiratory  Result Value Ref Range Status   Specimen Description TRACHEAL ASPIRATE  Final   Special Requests NONE  Final   Gram Stain   Final    FEW WBC PRESENT, PREDOMINANTLY PMN ABUNDANT GRAM POSITIVE COCCI ABUNDANT GRAM POSITIVE RODS MODERATE GRAM NEGATIVE RODS    Culture   Final    RARE Consistent with normal respiratory flora. Performed at [facility], [address], [place], [place] [address]    Report Status 12/01/2019 FINAL  Final  Culture, respiratory (non-expectorated)     Status: None   Collection Time: 12/03/19  1:45 PM   Specimen: Tracheal Aspirate; Respiratory  Result Value Ref Range Status   Specimen Description TRACHEAL ASPIRATE  Final   Special Requests NONE  Final  Gram Stain   Final    RARE WBC PRESENT,BOTH PMN AND MONONUCLEAR RARE GRAM POSITIVE COCCI RARE YEAST RARE GRAM POSITIVE RODS Performed at [facility], [address] [place], [place] [address]    Culture FEW CANDIDA TROPICALIS  Final   Report Status 12/06/2019 FINAL  Final  Culture, respiratory (non-expectorated)     Status: None   Collection Time: 12/04/19  1:53 PM   Specimen: Tracheal Aspirate; Respiratory  Result Value Ref Range Status   Specimen Description TRACHEAL ASPIRATE  Final   Special Requests NONE  Final   Gram Stain   Final    RARE WBC PRESENT, PREDOMINANTLY PMN FEW GRAM POSITIVE COCCI IN PAIRS IN CLUSTERS RARE GRAM NEGATIVE RODS    Culture   Final    Consistent with normal respiratory flora. Performed at [facility], [address], [place], [place] [address]    Report Status 12/06/2019 FINAL  Final  Culture, blood (routine x 2)     Status: None   Collection Time:  12/04/19  2:26 PM   Specimen: BLOOD LEFT HAND  Result Value Ref Range Status   Specimen Description BLOOD LEFT HAND  Final   Special Requests   Final    BOTTLES DRAWN AEROBIC ONLY Blood Culture results may not be optimal due to an inadequate volume of blood received in culture bottles   Culture   Final    NO GROWTH 5 DAYS Performed at [facility], [address], [place], [place] [address]    Report Status 12/09/2019 FINAL  Final  Culture, blood (routine x 2)     Status: None   Collection Time: 12/04/19  2:26 PM   Specimen: BLOOD LEFT HAND  Result Value Ref Range Status   Specimen Description BLOOD LEFT HAND  Final   Special Requests   Final    BOTTLES DRAWN AEROBIC ONLY Blood Culture results may not be optimal due to an inadequate volume of blood received in culture bottles   Culture   Final    NO GROWTH 5 DAYS Performed at [facility], [address], [place], [place] [address]    Report Status 12/09/2019 FINAL  Final  Culture, respiratory     Status: None   Collection Time: 12/13/19  9:20 AM   Specimen: Tracheal Aspirate; Respiratory  Result Value Ref Range Status   Specimen Description TRACHEAL ASPIRATE  Final   Special Requests NONE  Final   Gram Stain RARE WBC PRESENT, PREDOMINANTLY PMN RARE YEAST   Final   Culture   Final    RARE Consistent with normal respiratory flora. Performed at [facility], [address], [place], [place] [address]    Report Status 12/15/2019 FINAL  Final  Culture, blood (routine x 2)     Status: None   Collection Time: 12/17/19  4:46 PM   Specimen: BLOOD LEFT HAND  Result Value Ref Range Status   Specimen Description BLOOD LEFT HAND  Final   Special Requests   Final    BOTTLES DRAWN AEROBIC AND ANAEROBIC Blood Culture adequate volume   Culture   Final    NO GROWTH 5 DAYS Performed at [facility], [address], [place], [place] [address]    Report Status 12/22/2019 FINAL  Final  Culture, blood (routine x 2)      Status: None   Collection Time: 12/17/19  5:34 PM   Specimen: BLOOD LEFT HAND  Result Value  Ref Range Status   Specimen Description BLOOD LEFT HAND  Final   Special Requests   Final    BOTTLES DRAWN AEROBIC AND ANAEROBIC Blood Culture adequate volume   Culture   Final    NO GROWTH 5 DAYS Performed at [facility], [address], [place], [place] [address]    Report Status 12/22/2019 FINAL  Final  Culture, respiratory     Status: None   Collection Time: 12/17/19  5:40 PM   Specimen: Tracheal Aspirate; Respiratory  Result Value Ref Range Status   Specimen Description TRACHEAL ASPIRATE  Final   Special Requests NONE  Final   Gram Stain   Final    FEW WBC PRESENT, PREDOMINANTLY PMN FEW YEAST Performed at [facility], [address] [place], [place] [address]    Culture FEW CANDIDA TROPICALIS  Final   Report Status 12/19/2019 FINAL  Final  Culture, respiratory (non-expectorated)     Status: None   Collection Time: 12/24/19 10:25 AM   Specimen: Tracheal Aspirate; Respiratory  Result Value Ref Range Status   Specimen Description TRACHEAL ASPIRATE  Final   Special Requests NONE  Final   Gram Stain   Final    NO WBC SEEN RARE BUDDING YEAST SEEN Performed at [facility], [address], [place], [place] [address]    Culture RARE CANDIDA TROPICALIS  Final   Report Status 12/26/2019 FINAL  Final    Coagulation Studies: Recent Labs    12/30/19 0452  LABPROT 14.3  INR 1.2    Urinalysis: No results for input(s): COLORURINE, LABSPEC, PHURINE, GLUCOSEU, HGBUR, BILIRUBINUR, KETONESUR, PROTEINUR, UROBILINOGEN, NITRITE, LEUKOCYTESUR in the last 72 hours.  Invalid input(s): APPERANCEUR    Imaging: DG Chest Port 1 View  Result Date: 12/29/2019 CLINICAL DATA:  Respiratory failure.  COVID 19. EXAM: PORTABLE CHEST 1 VIEW COMPARISON:  One-view chest x-ray 12/18/2019.  CTA chest 12/10/2019 FINDINGS: Heart and this is scratched at the heart is enlarged.  Tracheostomy tube is in satisfactory position. Diffuse interstitial and airspace opacities have increased. Bilateral effusions are present. No pneumothorax is present. IMPRESSION: 1. Increasing interstitial and airspace disease compatible with multifocal pneumonia versus ARDS. 2. Bilateral pleural effusions. Electronically Signed   By: Marin Roberts M.D.   On: 12/29/2019 07:12     Medications:       Assessment/ Plan:  54 y.o. male a PMHx of recent COVID-19 infection with resultant respiratory failure status post tracheostomy placement, hypertension, hyperlipidemia, tobacco abuse, ARDS, PEG tube placement who was admitted to [facility] on 12/14/2019 for ongoing treatment of acute respiratory failure.  1.  Acute kidney injury suspect most likely underlying ATN.  Patient with significant azotemia with BUN of 180 and creatinine of 2.28 with urine output only of 700 cc.. -Uremia likely playing a role in his clinical status now.  Case discussed with hospitalist.  We will attempt a trial of dialysis treatments.  Temporary dialysis catheter to be placed today.  We will prepare orders.  2.  Hyperkalemia.  Serum potassium 5.6.  Dialyze the patient against a 2K bath.  3.  Acute respiratory failure.  Patient continues to require ventilatory support and has been difficult to wean from the ventilator.  Hopefully once we remove some fluid he will be easier to wean.   LOS: 0 Nadelyn Enriques 8/13/20218:28 AM

## 2019-12-31 DIAGNOSIS — N170 Acute kidney failure with tubular necrosis: Secondary | ICD-10-CM | POA: Diagnosis not present

## 2019-12-31 DIAGNOSIS — U071 COVID-19: Secondary | ICD-10-CM | POA: Diagnosis not present

## 2019-12-31 DIAGNOSIS — J80 Acute respiratory distress syndrome: Secondary | ICD-10-CM | POA: Diagnosis not present

## 2019-12-31 DIAGNOSIS — J9621 Acute and chronic respiratory failure with hypoxia: Secondary | ICD-10-CM | POA: Diagnosis not present

## 2019-12-31 LAB — HEPATITIS B SURFACE ANTIBODY, QUANTITATIVE: Hep B S AB Quant (Post): <3.1 m[IU]/mL — ABNORMAL LOW (ref >9.9)

## 2019-12-31 NOTE — Progress Notes (Signed)
Pulmonary Critical Care Medicine [facility] [place]   PULMONARY CRITICAL CARE SERVICE  PROGRESS NOTE  Date of Service: 12/31/2019  Danny Leach  UTM:[id number]  DOB: [birth year]   DOA: 11/30/2019  Referring Physician: Carron Curie, MD  HPI: Danny Leach is a 54 y.o. male seen for follow up of Acute on Chronic Respiratory Failure.  Patient underwent dialysis yesterday actually looks more awake and he is supposed to have another session of dialysis  Medications: Reviewed on Rounds  Physical Exam:  Vitals: Temperature 98.4 pulse 85 respiratory 32 blood pressure 154/75 saturations 96%  Ventilator Settings on pressure control FiO2 55% PEEP 8 IP 24  . General: Comfortable at this time . Eyes: Grossly normal lids, irises & conjunctiva . ENT: grossly tongue is normal . Neck: no obvious mass . Cardiovascular: S1 S2 normal no gallop . Respiratory: No rhonchi very coarse breath sounds . Abdomen: soft . Skin: no rash seen on limited exam . Musculoskeletal: not rigid . Psychiatric:unable to assess . Neurologic: no seizure no involuntary movements         Lab Data:   Basic Metabolic Panel: Recent Labs  Lab 12/26/19 1029 12/26/19 1810 12/27/19 1133 12/27/19 2130 12/28/19 0827 12/29/19 1005 12/30/19 0452  NA 145  --  146*  --  145 144 142  K 6.2*   < > 4.7 5.7* 4.8 5.2* 5.6*  CL 103  --  102  --  101 99 99  CO2 31  --  35*  --  34* 36* 35*  GLUCOSE 206*  --  177*  --  177* 139* 203*  BUN 130*  --  152*  --  162* 161* 180*  CREATININE 1.57*  --  1.84*  --  1.97* 2.17* 2.38*  CALCIUM 8.1*  --  8.5*  --  8.4* 8.4* 8.2*  MG  --   --   --   --  2.7*  --   --    < > = values in this interval not displayed.    ABG: No results for input(s): PHART, PCO2ART, PO2ART, HCO3, O2SAT in the last 168 hours.  Liver Function Tests: No results for input(s): AST, ALT, ALKPHOS, BILITOT, PROT, ALBUMIN in the last 168 hours. No results for input(s): LIPASE, AMYLASE in the  last 168 hours. No results for input(s): AMMONIA in the last 168 hours.  CBC: Recent Labs  Lab 12/24/19 1225 12/26/19 0609 12/30/19 0452  WBC 6.9 8.1 13.4*  HGB 8.2* 8.1* 7.5*  HCT 28.9* 29.5* 26.3*  MCV 112.0* 115.7* 116.4*  PLT 103* 111* 103*    Cardiac Enzymes: No results for input(s): CKTOTAL, CKMB, CKMBINDEX, TROPONINI in the last 168 hours.  BNP (last 3 results) No results for input(s): BNP in the last 8760 hours.  ProBNP (last 3 results) No results for input(s): PROBNP in the last 8760 hours.  Radiological Exams: IR Fluoro Guide CV Line Right  Result Date: 12/30/2019 CLINICAL DATA:  Acute kidney injury and need for non tunneled hemodialysis catheter to begin dialysis. EXAM: NON-TUNNELED CENTRAL VENOUS HEMODIALYSIS CATHETER PLACEMENT WITH ULTRASOUND AND FLUOROSCOPIC GUIDANCE FLUOROSCOPY TIME:  26 seconds.  5.0 mGy. PROCEDURE: The procedure, risks, benefits, and alternatives were explained to the patient's wife. Questions regarding the procedure were encouraged and answered. The patient's wife understands and consents to the procedure. A time-out was performed prior to initiating the procedure. The right neck and chest were prepped with chlorhexidine in a sterile fashion, and a sterile drape was applied covering  the operative field. Maximum barrier sterile technique with sterile gowns and gloves were used for the procedure. Local anesthesia was provided with 1% lidocaine. Ultrasound was used to confirm patency of the right internal jugular vein. After creating a small venotomy incision, a 21 gauge needle was advanced into the right internal jugular vein under direct, real-time ultrasound guidance. Ultrasound image documentation was performed. Venous access was dilated over a guidewire. A 20 cm, triple lumen, 13 French Mahurkar catheter was placed over the wire. Final catheter positioning was confirmed and documented with a fluoroscopic spot image. The catheter was aspirated, flushed  with saline, and injected with appropriate volume heparin dwells. The catheter exit site was secured with 0-Prolene retention sutures. COMPLICATIONS: None.  No pneumothorax. FINDINGS: After catheter placement, the tip lies at the cavoatrial junction. The catheter aspirates normally and is ready for immediate use. IMPRESSION: Placement of non-tunneled central venous hemodialysis catheter via the right internal jugular vein. The catheter tip lies at the cavoatrial junction. The catheter is ready for immediate use. Electronically Signed   By: Irish Lack M.D.   On: 12/30/2019 14:03   IR US Guide Vasc Access Right  Result Date: 12/30/2019 CLINICAL DATA:  Acute kidney injury and need for non tunneled hemodialysis catheter to begin dialysis. EXAM: NON-TUNNELED CENTRAL VENOUS HEMODIALYSIS CATHETER PLACEMENT WITH ULTRASOUND AND FLUOROSCOPIC GUIDANCE FLUOROSCOPY TIME:  26 seconds.  5.0 mGy. PROCEDURE: The procedure, risks, benefits, and alternatives were explained to the patient's wife. Questions regarding the procedure were encouraged and answered. The patient's wife understands and consents to the procedure. A time-out was performed prior to initiating the procedure. The right neck and chest were prepped with chlorhexidine in a sterile fashion, and a sterile drape was applied covering the operative field. Maximum barrier sterile technique with sterile gowns and gloves were used for the procedure. Local anesthesia was provided with 1% lidocaine. Ultrasound was used to confirm patency of the right internal jugular vein. After creating a small venotomy incision, a 21 gauge needle was advanced into the right internal jugular vein under direct, real-time ultrasound guidance. Ultrasound image documentation was performed. Venous access was dilated over a guidewire. A 20 cm, triple lumen, 13 French Mahurkar catheter was placed over the wire. Final catheter positioning was confirmed and documented with a fluoroscopic spot  image. The catheter was aspirated, flushed with saline, and injected with appropriate volume heparin dwells. The catheter exit site was secured with 0-Prolene retention sutures. COMPLICATIONS: None.  No pneumothorax. FINDINGS: After catheter placement, the tip lies at the cavoatrial junction. The catheter aspirates normally and is ready for immediate use. IMPRESSION: Placement of non-tunneled central venous hemodialysis catheter via the right internal jugular vein. The catheter tip lies at the cavoatrial junction. The catheter is ready for immediate use. Electronically Signed   By: Irish Lack M.D.   On: 12/30/2019 14:03    Assessment/Plan Active Problems:   Acute on chronic respiratory failure with hypoxia (HCC)   COVID-19 virus infection   Acute respiratory distress syndrome (ARDS) due to COVID-19 virus (HCC)   Pneumonia due to COVID-19 virus   Acute renal failure due to tubular necrosis (HCC)   1. Acute on chronic respiratory failure hypoxia plan is to continue with dialysis hopefully again today.  As far as the ventilator is concerned patient is not at any stage to try to wean right now. 2. COVID-19 virus infection in resolution patient has severe pulmonary damage from the disease 3. ARDS secondary to COVID-19 we will continue to  monitor 4. Pneumonia due to COVID-19 treated clinically showing some signs of improvement with some improvement in oxygenation. 5. Acute renal failure being followed by nephrology for dialysis   I have personally seen and evaluated the patient, evaluated laboratory and imaging results, formulated the assessment and plan and placed orders. The Patient requires high complexity decision making with multiple systems involvement.  Rounds were done with the Respiratory Therapy Director and Staff therapists and discussed with nursing staff also.  Yevonne Pax, MD [facility] Pulmonary Critical Care Medicine Sleep Medicine

## 2020-01-01 DIAGNOSIS — N170 Acute kidney failure with tubular necrosis: Secondary | ICD-10-CM | POA: Diagnosis not present

## 2020-01-01 DIAGNOSIS — U071 COVID-19: Secondary | ICD-10-CM | POA: Diagnosis not present

## 2020-01-01 DIAGNOSIS — J80 Acute respiratory distress syndrome: Secondary | ICD-10-CM | POA: Diagnosis not present

## 2020-01-01 DIAGNOSIS — J9621 Acute and chronic respiratory failure with hypoxia: Secondary | ICD-10-CM | POA: Diagnosis not present

## 2020-01-01 LAB — BASIC METABOLIC PANEL
Anion gap: 10 (ref 5–15)
BUN: 191 mg/dL — ABNORMAL HIGH (ref 6–20)
CO2: 30 mmol/L (ref 22–32)
Calcium: 8.1 mg/dL — ABNORMAL LOW (ref 8.9–10.3)
Chloride: 98 mmol/L (ref 98–111)
Creatinine, Ser: 2.46 mg/dL — ABNORMAL HIGH (ref 0.61–1.24)
GFR calc Af Amer: 33 mL/min — ABNORMAL LOW (ref >60)
GFR calc non Af Amer: 28 mL/min — ABNORMAL LOW (ref >60)
Glucose, Bld: 212 mg/dL — ABNORMAL HIGH (ref 70–99)
Potassium: 7.4 mmol/L (ref 3.5–5.1)
Sodium: 138 mmol/L (ref 135–145)

## 2020-01-01 LAB — POTASSIUM: Potassium: 6.9 mmol/L (ref 3.5–5.1)

## 2020-01-01 NOTE — Progress Notes (Signed)
Pulmonary Critical Care Medicine [facility] [place]   PULMONARY CRITICAL CARE SERVICE  PROGRESS NOTE  Date of Service: 01/01/2020  Danny Leach  PXT:[id number]  DOB: April 25, 1966   DOA: 11/17/2019  Referring Physician: Carron Curie, MD  HPI: Danny Leach is a 54 y.o. male seen for follow up of Acute on Chronic Respiratory Failure.  Patient currently is on pressure control mode has been on 55% FiO2 with an IP of 24 and a PEEP of 8  Medications: Reviewed on Rounds  Physical Exam:  Vitals: Temperature 96.4 pulse 76 respiratory 36 blood pressure is 164/89 saturations 98%  Ventilator Settings on pressure assist control FiO2 55% IP 24 PEEP 8  . General: Comfortable at this time . Eyes: Grossly normal lids, irises & conjunctiva . ENT: grossly tongue is normal . Neck: no obvious mass . Cardiovascular: S1 S2 normal no gallop . Respiratory: No rhonchi coarse breath sounds . Abdomen: soft . Skin: no rash seen on limited exam . Musculoskeletal: not rigid . Psychiatric:unable to assess . Neurologic: no seizure no involuntary movements         Lab Data:   Basic Metabolic Panel: Recent Labs  Lab 12/26/19 1029 12/26/19 1810 12/27/19 1133 12/27/19 2130 12/28/19 0827 12/29/19 1005 12/30/19 0452  NA 145  --  146*  --  145 144 142  K 6.2*   < > 4.7 5.7* 4.8 5.2* 5.6*  CL 103  --  102  --  101 99 99  CO2 31  --  35*  --  34* 36* 35*  GLUCOSE 206*  --  177*  --  177* 139* 203*  BUN 130*  --  152*  --  162* 161* 180*  CREATININE 1.57*  --  1.84*  --  1.97* 2.17* 2.38*  CALCIUM 8.1*  --  8.5*  --  8.4* 8.4* 8.2*  MG  --   --   --   --  2.7*  --   --    < > = values in this interval not displayed.    ABG: No results for input(s): PHART, PCO2ART, PO2ART, HCO3, O2SAT in the last 168 hours.  Liver Function Tests: No results for input(s): AST, ALT, ALKPHOS, BILITOT, PROT, ALBUMIN in the last 168 hours. No results for input(s): LIPASE, AMYLASE in the last 168  hours. No results for input(s): AMMONIA in the last 168 hours.  CBC: Recent Labs  Lab 12/26/19 0609 12/30/19 0452  WBC 8.1 13.4*  HGB 8.1* 7.5*  HCT 29.5* 26.3*  MCV 115.7* 116.4*  PLT 111* 103*    Cardiac Enzymes: No results for input(s): CKTOTAL, CKMB, CKMBINDEX, TROPONINI in the last 168 hours.  BNP (last 3 results) No results for input(s): BNP in the last 8760 hours.  ProBNP (last 3 results) No results for input(s): PROBNP in the last 8760 hours.  Radiological Exams: IR Fluoro Guide CV Line Right  Result Date: 12/30/2019 CLINICAL DATA:  Acute kidney injury and need for non tunneled hemodialysis catheter to begin dialysis. EXAM: NON-TUNNELED CENTRAL VENOUS HEMODIALYSIS CATHETER PLACEMENT WITH ULTRASOUND AND FLUOROSCOPIC GUIDANCE FLUOROSCOPY TIME:  26 seconds.  5.0 mGy. PROCEDURE: The procedure, risks, benefits, and alternatives were explained to the patient's wife. Questions regarding the procedure were encouraged and answered. The patient's wife understands and consents to the procedure. A time-out was performed prior to initiating the procedure. The right neck and chest were prepped with chlorhexidine in a sterile fashion, and a sterile drape was applied covering the operative  field. Maximum barrier sterile technique with sterile gowns and gloves were used for the procedure. Local anesthesia was provided with 1% lidocaine. Ultrasound was used to confirm patency of the right internal jugular vein. After creating a small venotomy incision, a 21 gauge needle was advanced into the right internal jugular vein under direct, real-time ultrasound guidance. Ultrasound image documentation was performed. Venous access was dilated over a guidewire. A 20 cm, triple lumen, 13 French Mahurkar catheter was placed over the wire. Final catheter positioning was confirmed and documented with a fluoroscopic spot image. The catheter was aspirated, flushed with saline, and injected with appropriate volume  heparin dwells. The catheter exit site was secured with 0-Prolene retention sutures. COMPLICATIONS: None.  No pneumothorax. FINDINGS: After catheter placement, the tip lies at the cavoatrial junction. The catheter aspirates normally and is ready for immediate use. IMPRESSION: Placement of non-tunneled central venous hemodialysis catheter via the right internal jugular vein. The catheter tip lies at the cavoatrial junction. The catheter is ready for immediate use. Electronically Signed   By: Irish Lack M.D.   On: 12/30/2019 14:03   IR US Guide Vasc Access Right  Result Date: 12/30/2019 CLINICAL DATA:  Acute kidney injury and need for non tunneled hemodialysis catheter to begin dialysis. EXAM: NON-TUNNELED CENTRAL VENOUS HEMODIALYSIS CATHETER PLACEMENT WITH ULTRASOUND AND FLUOROSCOPIC GUIDANCE FLUOROSCOPY TIME:  26 seconds.  5.0 mGy. PROCEDURE: The procedure, risks, benefits, and alternatives were explained to the patient's wife. Questions regarding the procedure were encouraged and answered. The patient's wife understands and consents to the procedure. A time-out was performed prior to initiating the procedure. The right neck and chest were prepped with chlorhexidine in a sterile fashion, and a sterile drape was applied covering the operative field. Maximum barrier sterile technique with sterile gowns and gloves were used for the procedure. Local anesthesia was provided with 1% lidocaine. Ultrasound was used to confirm patency of the right internal jugular vein. After creating a small venotomy incision, a 21 gauge needle was advanced into the right internal jugular vein under direct, real-time ultrasound guidance. Ultrasound image documentation was performed. Venous access was dilated over a guidewire. A 20 cm, triple lumen, 13 French Mahurkar catheter was placed over the wire. Final catheter positioning was confirmed and documented with a fluoroscopic spot image. The catheter was aspirated, flushed with  saline, and injected with appropriate volume heparin dwells. The catheter exit site was secured with 0-Prolene retention sutures. COMPLICATIONS: None.  No pneumothorax. FINDINGS: After catheter placement, the tip lies at the cavoatrial junction. The catheter aspirates normally and is ready for immediate use. IMPRESSION: Placement of non-tunneled central venous hemodialysis catheter via the right internal jugular vein. The catheter tip lies at the cavoatrial junction. The catheter is ready for immediate use. Electronically Signed   By: Irish Lack M.D.   On: 12/30/2019 14:03    Assessment/Plan Active Problems:   Acute on chronic respiratory failure with hypoxia (HCC)   COVID-19 virus infection   Acute respiratory distress syndrome (ARDS) due to COVID-19 virus (HCC)   Pneumonia due to COVID-19 virus   Acute renal failure due to tubular necrosis (HCC)   1. Acute on chronic respiratory failure hypoxia we will continue with full support on the ventilator right now on 55% FiO2.  He is more awake has actually responded rather nicely to the dialysis he will be getting dialysis tomorrow hopefully for the entire week 2. COVID-19 virus infection in resolution phase 3. ARDS treated we will continue to follow along.  4. Pneumonia due to COVID-19 slow improvement 5. Acute renal failure being followed by nephrology   I have personally seen and evaluated the patient, evaluated laboratory and imaging results, formulated the assessment and plan and placed orders. The Patient requires high complexity decision making with multiple systems involvement.  Rounds were done with the Respiratory Therapy Director and Staff therapists and discussed with nursing staff also.  Yevonne Pax, MD [facility] Pulmonary Critical Care Medicine Sleep Medicine

## 2020-01-02 DIAGNOSIS — J9621 Acute and chronic respiratory failure with hypoxia: Secondary | ICD-10-CM | POA: Diagnosis not present

## 2020-01-02 DIAGNOSIS — U071 COVID-19: Secondary | ICD-10-CM | POA: Diagnosis not present

## 2020-01-02 DIAGNOSIS — J80 Acute respiratory distress syndrome: Secondary | ICD-10-CM | POA: Diagnosis not present

## 2020-01-02 DIAGNOSIS — N170 Acute kidney failure with tubular necrosis: Secondary | ICD-10-CM | POA: Diagnosis not present

## 2020-01-02 LAB — BASIC METABOLIC PANEL
Anion gap: 11 (ref 5–15)
Anion gap: 12 (ref 5–15)
BUN: 202 mg/dL — ABNORMAL HIGH (ref 6–20)
BUN: 202 mg/dL — ABNORMAL HIGH (ref 6–20)
CO2: 34 mmol/L — ABNORMAL HIGH (ref 22–32)
CO2: 35 mmol/L — ABNORMAL HIGH (ref 22–32)
Calcium: 8.4 mg/dL — ABNORMAL LOW (ref 8.9–10.3)
Calcium: 8.3 mg/dL — ABNORMAL LOW (ref 8.9–10.3)
Chloride: 95 mmol/L — ABNORMAL LOW (ref 98–111)
Chloride: 95 mmol/L — ABNORMAL LOW (ref 98–111)
Creatinine, Ser: 2.44 mg/dL — ABNORMAL HIGH (ref 0.61–1.24)
Creatinine, Ser: 2.54 mg/dL — ABNORMAL HIGH (ref 0.61–1.24)
GFR calc Af Amer: 33 mL/min — ABNORMAL LOW (ref >60)
GFR calc Af Amer: 31 mL/min — ABNORMAL LOW (ref >60)
GFR calc non Af Amer: 28 mL/min — ABNORMAL LOW (ref >60)
GFR calc non Af Amer: 27 mL/min — ABNORMAL LOW (ref >60)
Glucose, Bld: 195 mg/dL — ABNORMAL HIGH (ref 70–99)
Glucose, Bld: 220 mg/dL — ABNORMAL HIGH (ref 70–99)
Potassium: 6 mmol/L — ABNORMAL HIGH (ref 3.5–5.1)
Potassium: 5.6 mmol/L — ABNORMAL HIGH (ref 3.5–5.1)
Sodium: 140 mmol/L (ref 135–145)
Sodium: 142 mmol/L (ref 135–145)

## 2020-01-02 LAB — CBC
HCT: 25 % — ABNORMAL LOW (ref 39.0–52.0)
Hemoglobin: 7.4 g/dL — ABNORMAL LOW (ref 13.0–17.0)
MCH: 33.8 pg (ref 26.0–34.0)
MCHC: 29.6 g/dL — ABNORMAL LOW (ref 30.0–36.0)
MCV: 114.2 fL — ABNORMAL HIGH (ref 80.0–100.0)
Platelets: 100 10*3/uL — ABNORMAL LOW (ref 150–400)
RBC: 2.19 MIL/uL — ABNORMAL LOW (ref 4.22–5.81)
RDW: 23.9 % — ABNORMAL HIGH (ref 11.5–15.5)
WBC: 22.2 10*3/uL — ABNORMAL HIGH (ref 4.0–10.5)
nRBC: 0.2 % (ref 0.0–0.2)

## 2020-01-02 NOTE — Progress Notes (Signed)
Pulmonary Critical Care Medicine [facility] [place]   PULMONARY CRITICAL CARE SERVICE  PROGRESS NOTE  Date of Service: 01/02/2020  Danny Leach  BZJ:[id number]  DOB: 10-Jan-1966   DOA: 11/25/2019  Referring Physician: Carron Curie, MD  HPI: Danny Leach is a 54 y.o. male seen for follow up of Acute on Chronic Respiratory Failure.  Patient is on pressure control mode on the ventilator and full support.  Awaiting dialysis this week  Medications: Reviewed on Rounds  Physical Exam:  Vitals: Temperature 97.4 pulse 97 respiratory 33 blood pressure is 155/84 saturations 94%  Ventilator Settings on pressure control FiO2 50% IP 24 PEEP 8  . General: Comfortable at this time . Eyes: Grossly normal lids, irises & conjunctiva . ENT: grossly tongue is normal . Neck: no obvious mass . Cardiovascular: S1 S2 normal no gallop . Respiratory: No rhonchi coarse breath . Abdomen: soft . Skin: no rash seen on limited exam . Musculoskeletal: not rigid . Psychiatric:unable to assess . Neurologic: no seizure no involuntary movements         Lab Data:   Basic Metabolic Panel: Recent Labs  Lab 12/28/19 0827 12/28/19 0827 12/29/19 1005 12/29/19 1005 12/30/19 0452 01/01/20 1829 01/01/20 2003 01/02/20 0218 01/02/20 0652  NA 145   < > 144  --  142 138  --  140 142  K 4.8   < > 5.2*   < > 5.6* 7.4* 6.9* 6.0* 5.6*  CL 101   < > 99  --  99 98  --  95* 95*  CO2 34*   < > 36*  --  35* 30  --  34* 35*  GLUCOSE 177*   < > 139*  --  203* 212*  --  195* 220*  BUN 162*   < > 161*  --  180* 191*  --  202* 202*  CREATININE 1.97*   < > 2.17*  --  2.38* 2.46*  --  2.44* 2.54*  CALCIUM 8.4*   < > 8.4*  --  8.2* 8.1*  --  8.4* 8.3*  MG 2.7*  --   --   --   --   --   --   --   --    < > = values in this interval not displayed.    ABG: No results for input(s): PHART, PCO2ART, PO2ART, HCO3, O2SAT in the last 168 hours.  Liver Function Tests: No results for input(s): AST, ALT,  ALKPHOS, BILITOT, PROT, ALBUMIN in the last 168 hours. No results for input(s): LIPASE, AMYLASE in the last 168 hours. No results for input(s): AMMONIA in the last 168 hours.  CBC: Recent Labs  Lab 12/30/19 0452 01/02/20 0218  WBC 13.4* 22.2*  HGB 7.5* 7.4*  HCT 26.3* 25.0*  MCV 116.4* 114.2*  PLT 103* 100*    Cardiac Enzymes: No results for input(s): CKTOTAL, CKMB, CKMBINDEX, TROPONINI in the last 168 hours.  BNP (last 3 results) No results for input(s): BNP in the last 8760 hours.  ProBNP (last 3 results) No results for input(s): PROBNP in the last 8760 hours.  Radiological Exams: No results found.  Assessment/Plan Active Problems:   Acute on chronic respiratory failure with hypoxia (HCC)   COVID-19 virus infection   Acute respiratory distress syndrome (ARDS) due to COVID-19 virus (HCC)   Pneumonia due to COVID-19 virus   Acute renal failure due to tubular necrosis (HCC)   1. Acute on chronic respiratory failure with hypoxia we will continue  with pressure control mode right now on 50% FiO2 patient is not ready for weaning yet once his volume status is addressed we will reassess 2. COVID-19 virus infection in resolution we will continue to monitor 3. ARDS treated 4. Pneumonia due to COVID-19 treated clinically is improving 5. Acute renal failure tubular necrosis patient right now has a creatinine of 2.44 we are monitoring patient's labs and also nephrology recommendation   I have personally seen and evaluated the patient, evaluated laboratory and imaging results, formulated the assessment and plan and placed orders. The Patient requires high complexity decision making with multiple systems involvement.  Rounds were done with the Respiratory Therapy Director and Staff therapists and discussed with nursing staff also.  Yevonne Pax, MD [facility] Pulmonary Critical Care Medicine Sleep Medicine

## 2020-01-02 NOTE — Progress Notes (Signed)
[facility]  ROUNDING NOTE   Subjective:  Patient seen and evaluated at bedside. High potassium over the weekend. Discussed with dietitian.    Objective:  Vital signs in last 24 hours:  Temperature 97.4 pulse 77 respirations 33 blood pressure 155/84  Physical Exam: General: Critically ill-appearing  Head: Normocephalic, atraumatic. Moist oral mucosal membranes  Eyes: Anicteric  Neck: Tracheostomy in place  Lungs:  Bilateral rhonchi, vent assisted  Heart: S1S2 no rubs  Abdomen:  Soft, nontender, bowel sounds present  Extremities: 3+ generalized edema  Neurologic: Awake, alert, on ventilator  Skin: No lesions       Basic Metabolic Panel: Recent Labs  Lab 12/28/19 0827 12/28/19 0827 12/29/19 1005 12/29/19 1005 12/30/19 0452 12/30/19 0452 01/01/20 1829 01/01/20 2003 01/02/20 0218 01/02/20 0652  NA 145   < > 144  --  142  --  138  --  140 142  K 4.8   < > 5.2*   < > 5.6*  --  7.4* 6.9* 6.0* 5.6*  CL 101   < > 99  --  99  --  98  --  95* 95*  CO2 34*   < > 36*  --  35*  --  30  --  34* 35*  GLUCOSE 177*   < > 139*  --  203*  --  212*  --  195* 220*  BUN 162*   < > 161*  --  180*  --  191*  --  202* 202*  CREATININE 1.97*   < > 2.17*  --  2.38*  --  2.46*  --  2.44* 2.54*  CALCIUM 8.4*   < > 8.4*   < > 8.2*   < > 8.1*  --  8.4* 8.3*  MG 2.7*  --   --   --   --   --   --   --   --   --    < > = values in this interval not displayed.    Liver Function Tests: No results for input(s): AST, ALT, ALKPHOS, BILITOT, PROT, ALBUMIN in the last 168 hours. No results for input(s): LIPASE, AMYLASE in the last 168 hours. No results for input(s): AMMONIA in the last 168 hours.  CBC: Recent Labs  Lab 12/30/19 0452 01/02/20 0218  WBC 13.4* 22.2*  HGB 7.5* 7.4*  HCT 26.3* 25.0*  MCV 116.4* 114.2*  PLT 103* 100*    Cardiac Enzymes: No results for input(s): CKTOTAL, CKMB, CKMBINDEX, TROPONINI in the last 168 hours.  BNP: Invalid input(s):  POCBNP  CBG: No results for input(s): GLUCAP in the last 168 hours.  Microbiology: Results for orders placed or performed during the hospital encounter of December 07, 2019  Culture, respiratory     Status: None   Collection Time: 11/29/19  9:21 AM   Specimen: Tracheal Aspirate; Respiratory  Result Value Ref Range Status   Specimen Description TRACHEAL ASPIRATE  Final   Special Requests NONE  Final   Gram Stain   Final    FEW WBC PRESENT, PREDOMINANTLY PMN ABUNDANT GRAM POSITIVE COCCI ABUNDANT GRAM POSITIVE RODS MODERATE GRAM NEGATIVE RODS    Culture   Final    RARE Consistent with normal respiratory flora. Performed at [facility], [address] [place], [place] [address]    Report Status 12/01/2019 FINAL  Final  Culture, respiratory (non-expectorated)     Status: None   Collection Time: 12/03/19  1:45 PM   Specimen: Tracheal Aspirate; Respiratory  Result Value Ref Range Status   Specimen Description TRACHEAL ASPIRATE  Final   Special Requests NONE  Final   Gram Stain   Final    RARE WBC PRESENT,BOTH PMN AND MONONUCLEAR RARE GRAM POSITIVE COCCI RARE YEAST RARE GRAM POSITIVE RODS Performed at [facility], [address], [place], [place] [address]    Culture FEW CANDIDA TROPICALIS  Final   Report Status 12/06/2019 FINAL  Final  Culture, respiratory (non-expectorated)     Status: None   Collection Time: 12/04/19  1:53 PM   Specimen: Tracheal Aspirate; Respiratory  Result Value Ref Range Status   Specimen Description TRACHEAL ASPIRATE  Final   Special Requests NONE  Final   Gram Stain   Final    RARE WBC PRESENT, PREDOMINANTLY PMN FEW GRAM POSITIVE COCCI IN PAIRS IN CLUSTERS RARE GRAM NEGATIVE RODS    Culture   Final    Consistent with normal respiratory flora. Performed at [facility], [address] [place], [place] [address]    Report Status 12/06/2019 FINAL  Final  Culture, blood (routine x 2)     Status: None   Collection Time: 12/04/19  2:26  PM   Specimen: BLOOD LEFT HAND  Result Value Ref Range Status   Specimen Description BLOOD LEFT HAND  Final   Special Requests   Final    BOTTLES DRAWN AEROBIC ONLY Blood Culture results may not be optimal due to an inadequate volume of blood received in culture bottles   Culture   Final    NO GROWTH 5 DAYS Performed at [facility], [address], [place], [place] [address]    Report Status 12/09/2019 FINAL  Final  Culture, blood (routine x 2)     Status: None   Collection Time: 12/04/19  2:26 PM   Specimen: BLOOD LEFT HAND  Result Value Ref Range Status   Specimen Description BLOOD LEFT HAND  Final   Special Requests   Final    BOTTLES DRAWN AEROBIC ONLY Blood Culture results may not be optimal due to an inadequate volume of blood received in culture bottles   Culture   Final    NO GROWTH 5 DAYS Performed at [facility], [address], [place], [place] [address]    Report Status 12/09/2019 FINAL  Final  Culture, respiratory     Status: None   Collection Time: 12/13/19  9:20 AM   Specimen: Tracheal Aspirate; Respiratory  Result Value Ref Range Status   Specimen Description TRACHEAL ASPIRATE  Final   Special Requests NONE  Final   Gram Stain RARE WBC PRESENT, PREDOMINANTLY PMN RARE YEAST   Final   Culture   Final    RARE Consistent with normal respiratory flora. Performed at [facility], [address], [place], [place] [address]    Report Status 12/15/2019 FINAL  Final  Culture, blood (routine x 2)     Status: None   Collection Time: 12/17/19  4:46 PM   Specimen: BLOOD LEFT HAND  Result Value Ref Range Status   Specimen Description BLOOD LEFT HAND  Final   Special Requests   Final    BOTTLES DRAWN AEROBIC AND ANAEROBIC Blood Culture adequate volume   Culture   Final    NO GROWTH 5 DAYS Performed at [facility], [address], [place], [place] [address]    Report Status 12/22/2019 FINAL  Final  Culture, blood (routine x 2)  Status:  None   Collection Time: 12/17/19  5:34 PM   Specimen: BLOOD LEFT HAND  Result Value Ref Range Status   Specimen Description BLOOD LEFT HAND  Final   Special Requests   Final    BOTTLES DRAWN AEROBIC AND ANAEROBIC Blood Culture adequate volume   Culture   Final    NO GROWTH 5 DAYS Performed at [facility], [address] [place], [place] [address]    Report Status 12/22/2019 FINAL  Final  Culture, respiratory     Status: None   Collection Time: 12/17/19  5:40 PM   Specimen: Tracheal Aspirate; Respiratory  Result Value Ref Range Status   Specimen Description TRACHEAL ASPIRATE  Final   Special Requests NONE  Final   Gram Stain   Final    FEW WBC PRESENT, PREDOMINANTLY PMN FEW YEAST Performed at [facility], [address], [place], [place] [address]    Culture FEW CANDIDA TROPICALIS  Final   Report Status 12/19/2019 FINAL  Final  Culture, respiratory (non-expectorated)     Status: None   Collection Time: 12/24/19 10:25 AM   Specimen: Tracheal Aspirate; Respiratory  Result Value Ref Range Status   Specimen Description TRACHEAL ASPIRATE  Final   Special Requests NONE  Final   Gram Stain   Final    NO WBC SEEN RARE BUDDING YEAST SEEN Performed at [facility], [address], [place], [place] [address]    Culture RARE CANDIDA TROPICALIS  Final   Report Status 12/26/2019 FINAL  Final    Coagulation Studies: No results for input(s): LABPROT, INR in the last 72 hours.  Urinalysis: No results for input(s): COLORURINE, LABSPEC, PHURINE, GLUCOSEU, HGBUR, BILIRUBINUR, KETONESUR, PROTEINUR, UROBILINOGEN, NITRITE, LEUKOCYTESUR in the last 72 hours.  Invalid input(s): APPERANCEUR    Imaging: No results found.   Medications:       Assessment/ Plan:  54 y.o. male a PMHx of recent COVID-19 infection with resultant respiratory failure status post tracheostomy placement, hypertension, hyperlipidemia, tobacco abuse, ARDS, PEG tube placement who was admitted  to [facility] on 2019/12/14 for ongoing treatment of acute respiratory failure.  1.  Acute kidney injury suspect most likely underlying ATN.  Patient with significant azotemia with BUN of 180 and creatinine of 2.28 with urine output only of 700 cc.. -Patient due for another dialysis session today.  Orders have been prepared.  We will plan for another dialysis session on Wednesday.  2.  Hyperkalemia.  Potassium was high at 7.4 over the weekend.  Now down to 5.6.  Continue to dialyze against a 2K bath today.  3.  Acute respiratory failure.  Continue ultrafiltration with dialysis in an effort to treat any underlying pulmonary edema and aid in weaning from the ventilator.   LOS: 0 Danny Leach 8/16/20218:21 AM

## 2020-01-03 DIAGNOSIS — J9621 Acute and chronic respiratory failure with hypoxia: Secondary | ICD-10-CM | POA: Diagnosis not present

## 2020-01-03 DIAGNOSIS — J80 Acute respiratory distress syndrome: Secondary | ICD-10-CM | POA: Diagnosis not present

## 2020-01-03 DIAGNOSIS — U071 COVID-19: Secondary | ICD-10-CM | POA: Diagnosis not present

## 2020-01-03 DIAGNOSIS — N170 Acute kidney failure with tubular necrosis: Secondary | ICD-10-CM | POA: Diagnosis not present

## 2020-01-03 NOTE — Progress Notes (Addendum)
Pulmonary Critical Care Medicine [facility] [place]   PULMONARY CRITICAL CARE SERVICE  PROGRESS NOTE  Date of Service: 01/03/2020  Danny Leach  JKD:[id number]  DOB: Jul 27, 1965   DOA: 12/05/2019  Referring Physician: Carron Curie, MD  HPI: Danny Leach is a 54 y.o. male seen for follow up of Acute on Chronic Respiratory Failure.  Patient has now popped because on 2 trachs in the last 18 hours.  Patient was downsized to a #6 XLT.  Remains on pressure control with a rate of 35 and FiO2 of 45% satting well.  Medications: Reviewed on Rounds  Physical Exam:  Vitals: Pulse 74 respirations 26 BP 149/78 O2 sat 93% temp 96.0  Ventilator Settings ventilator AC PC rate of 35 respiratory pressure 24 PEEP of 8 FiO2 40%  . General: Comfortable at this time . Eyes: Grossly normal lids, irises & conjunctiva . ENT: grossly tongue is normal . Neck: no obvious mass . Cardiovascular: S1 S2 normal no gallop . Respiratory: No rales or rhonchi noted . Abdomen: soft . Skin: no rash seen on limited exam . Musculoskeletal: not rigid . Psychiatric:unable to assess . Neurologic: no seizure no involuntary movements         Lab Data:   Basic Metabolic Panel: Recent Labs  Lab 12/28/19 0827 12/28/19 0827 12/29/19 1005 12/29/19 1005 12/30/19 0452 01/01/20 1829 01/01/20 2003 01/02/20 0218 01/02/20 0652  NA 145   < > 144  --  142 138  --  140 142  K 4.8   < > 5.2*   < > 5.6* 7.4* 6.9* 6.0* 5.6*  CL 101   < > 99  --  99 98  --  95* 95*  CO2 34*   < > 36*  --  35* 30  --  34* 35*  GLUCOSE 177*   < > 139*  --  203* 212*  --  195* 220*  BUN 162*   < > 161*  --  180* 191*  --  202* 202*  CREATININE 1.97*   < > 2.17*  --  2.38* 2.46*  --  2.44* 2.54*  CALCIUM 8.4*   < > 8.4*  --  8.2* 8.1*  --  8.4* 8.3*  MG 2.7*  --   --   --   --   --   --   --   --    < > = values in this interval not displayed.    ABG: No results for input(s): PHART, PCO2ART, PO2ART, HCO3, O2SAT in the  last 168 hours.  Liver Function Tests: No results for input(s): AST, ALT, ALKPHOS, BILITOT, PROT, ALBUMIN in the last 168 hours. No results for input(s): LIPASE, AMYLASE in the last 168 hours. No results for input(s): AMMONIA in the last 168 hours.  CBC: Recent Labs  Lab 12/30/19 0452 01/02/20 0218  WBC 13.4* 22.2*  HGB 7.5* 7.4*  HCT 26.3* 25.0*  MCV 116.4* 114.2*  PLT 103* 100*    Cardiac Enzymes: No results for input(s): CKTOTAL, CKMB, CKMBINDEX, TROPONINI in the last 168 hours.  BNP (last 3 results) No results for input(s): BNP in the last 8760 hours.  ProBNP (last 3 results) No results for input(s): PROBNP in the last 8760 hours.  Radiological Exams: No results found.  Assessment/Plan Active Problems:   Acute on chronic respiratory failure with hypoxia (HCC)   COVID-19 virus infection   Acute respiratory distress syndrome (ARDS) due to COVID-19 virus (HCC)   Pneumonia due to  COVID-19 virus   Acute renal failure due to tubular necrosis (HCC)   1. Acute on chronic respiratory failure with hypoxia continue on pressure control mode at this time we will continue to attempt weaning as tolerated currently requiring PEEP of 8 with an FiO2 of 45%.  Continue supportive measures pulmonary toilet. 2. COVID-19 virus infection in resolution we will continue to monitor 3. ARDS treated 4. Pneumonia due to COVID-19 treated clinically is improving 5. Acute renal failure tubular necrosis continue to monitor labs and follow nephrology recommendations.   I have personally seen and evaluated the patient, evaluated laboratory and imaging results, formulated the assessment and plan and placed orders. The Patient requires high complexity decision making with multiple systems involvement.  Rounds were done with the Respiratory Therapy Director and Staff therapists and discussed with nursing staff also.  Yevonne Pax, MD [facility] Pulmonary Critical Care Medicine Sleep Medicine

## 2020-01-04 DIAGNOSIS — U071 COVID-19: Secondary | ICD-10-CM | POA: Diagnosis not present

## 2020-01-04 DIAGNOSIS — N170 Acute kidney failure with tubular necrosis: Secondary | ICD-10-CM | POA: Diagnosis not present

## 2020-01-04 DIAGNOSIS — J80 Acute respiratory distress syndrome: Secondary | ICD-10-CM | POA: Diagnosis not present

## 2020-01-04 DIAGNOSIS — J9621 Acute and chronic respiratory failure with hypoxia: Secondary | ICD-10-CM | POA: Diagnosis not present

## 2020-01-04 LAB — BASIC METABOLIC PANEL
Anion gap: 8 (ref 5–15)
BUN: 126 mg/dL — ABNORMAL HIGH (ref 6–20)
CO2: 32 mmol/L (ref 22–32)
Calcium: 8.3 mg/dL — ABNORMAL LOW (ref 8.9–10.3)
Chloride: 98 mmol/L (ref 98–111)
Creatinine, Ser: 1.77 mg/dL — ABNORMAL HIGH (ref 0.61–1.24)
GFR calc Af Amer: 49 mL/min — ABNORMAL LOW (ref >60)
GFR calc non Af Amer: 42 mL/min — ABNORMAL LOW (ref >60)
Glucose, Bld: 152 mg/dL — ABNORMAL HIGH (ref 70–99)
Potassium: 4.5 mmol/L (ref 3.5–5.1)
Sodium: 138 mmol/L (ref 135–145)

## 2020-01-04 LAB — CBC
HCT: 22.2 % — ABNORMAL LOW (ref 39.0–52.0)
Hemoglobin: 6.6 g/dL — CL (ref 13.0–17.0)
MCH: 33.7 pg (ref 26.0–34.0)
MCHC: 29.7 g/dL — ABNORMAL LOW (ref 30.0–36.0)
MCV: 113.3 fL — ABNORMAL HIGH (ref 80.0–100.0)
Platelets: 76 10*3/uL — ABNORMAL LOW (ref 150–400)
RBC: 1.96 MIL/uL — ABNORMAL LOW (ref 4.22–5.81)
RDW: 23.1 % — ABNORMAL HIGH (ref 11.5–15.5)
WBC: 16 10*3/uL — ABNORMAL HIGH (ref 4.0–10.5)
nRBC: 0.03 % (ref 0.0–0.2)

## 2020-01-04 LAB — PREPARE RBC (CROSSMATCH)

## 2020-01-04 NOTE — Progress Notes (Addendum)
Pulmonary Critical Care Medicine [facility] [place]   PULMONARY CRITICAL CARE SERVICE  PROGRESS NOTE  Date of Service: 01/04/2020  Danny Leach  ZDG:[id number]  DOB: 06-Oct-1965   DOA: 12/19/19  Referring Physician: Carron Curie, MD  HPI: Danny Leach is a 54 y.o. male seen for follow up of Acute on Chronic Respiratory Failure.  Patient remains on full support at this time pressure, rate of 35 and FiO2 35% currently satting well no distress.  Medications: Reviewed on Rounds  Physical Exam:  Vitals: Pulse 91 respirations 30 BP 144/72 O2 sat 92% temp 97.1  Ventilator Settings ventilator mode AC PC rate 35) pressure 24 PEEP of 8 FiO2 55%  . General: Comfortable at this time . Eyes: Grossly normal lids, irises & conjunctiva . ENT: grossly tongue is normal . Neck: no obvious mass . Cardiovascular: S1 S2 normal no gallop . Respiratory: Coarse breath sounds . Abdomen: soft . Skin: no rash seen on limited exam . Musculoskeletal: not rigid . Psychiatric:unable to assess . Neurologic: no seizure no involuntary movements         Lab Data:   Basic Metabolic Panel: Recent Labs  Lab 12/30/19 0452 12/30/19 0452 01/01/20 1829 01/01/20 2003 01/02/20 0218 01/02/20 0652 01/04/20 1238  NA 142  --  138  --  140 142 138  K 5.6*   < > 7.4* 6.9* 6.0* 5.6* 4.5  CL 99  --  98  --  95* 95* 98  CO2 35*  --  30  --  34* 35* 32  GLUCOSE 203*  --  212*  --  195* 220* 152*  BUN 180*  --  191*  --  202* 202* 126*  CREATININE 2.38*  --  2.46*  --  2.44* 2.54* 1.77*  CALCIUM 8.2*  --  8.1*  --  8.4* 8.3* 8.3*   < > = values in this interval not displayed.    ABG: No results for input(s): PHART, PCO2ART, PO2ART, HCO3, O2SAT in the last 168 hours.  Liver Function Tests: No results for input(s): AST, ALT, ALKPHOS, BILITOT, PROT, ALBUMIN in the last 168 hours. No results for input(s): LIPASE, AMYLASE in the last 168 hours. No results for input(s): AMMONIA in the last  168 hours.  CBC: Recent Labs  Lab 12/30/19 0452 01/02/20 0218 01/04/20 1238  WBC 13.4* 22.2* 16.0*  HGB 7.5* 7.4* 6.6*  HCT 26.3* 25.0* 22.2*  MCV 116.4* 114.2* 113.3*  PLT 103* 100* 76*    Cardiac Enzymes: No results for input(s): CKTOTAL, CKMB, CKMBINDEX, TROPONINI in the last 168 hours.  BNP (last 3 results) No results for input(s): BNP in the last 8760 hours.  ProBNP (last 3 results) No results for input(s): PROBNP in the last 8760 hours.  Radiological Exams: No results found.  Assessment/Plan Active Problems:   Acute on chronic respiratory failure with hypoxia (HCC)   COVID-19 virus infection   Acute respiratory distress syndrome (ARDS) due to COVID-19 virus (HCC)   Pneumonia due to COVID-19 virus   Acute renal failure due to tubular necrosis (HCC)   1. Acute on chronic respiratory failure with hypoxia continue on pressure control mode at this time we will continue to attempt weaning as tolerated currently requiring PEEP of 8 with an FiO2 of 55%.  Continue supportive measures pulmonary toilet. 2. COVID-19 virus infection in resolution we will continue to monitor 3. ARDS treated 4. Pneumonia due to COVID-19 treated clinically is improving 5. Acute renal failure tubular necrosis continue  to monitor labs and follow nephrology recommendations.   I have personally seen and evaluated the patient, evaluated laboratory and imaging results, formulated the assessment and plan and placed orders. The Patient requires high complexity decision making with multiple systems involvement.  Rounds were done with the Respiratory Therapy Director and Staff therapists and discussed with nursing staff also.  Yevonne Pax, MD [facility] Pulmonary Critical Care Medicine Sleep Medicine

## 2020-01-04 NOTE — Progress Notes (Signed)
[facility]  ROUNDING NOTE   Subjective:  Patient remains significantly volume overloaded. Patient to undergo dialysis treatment today. New labs pending.    Objective:  Vital signs in last 24 hours:  Temperature 97 pulse 91 respirations 30 blood pressure 144/72  Physical Exam: General: Critically ill-appearing  Head: Normocephalic, atraumatic. Moist oral mucosal membranes  Eyes: Anicteric  Neck: Tracheostomy in place  Lungs:  Bilateral rhonchi, vent assisted  Heart: S1S2 no rubs  Abdomen:  Soft, nontender, bowel sounds present  Extremities: 3+ generalized edema  Neurologic: Currently not following commands  Skin: No lesions       Basic Metabolic Panel: Recent Labs  Lab 12/29/19 1005 12/29/19 1005 12/30/19 0452 12/30/19 0452 01/01/20 1829 01/01/20 2003 01/02/20 0218 01/02/20 0652  NA 144  --  142  --  138  --  140 142  K 5.2*   < > 5.6*  --  7.4* 6.9* 6.0* 5.6*  CL 99  --  99  --  98  --  95* 95*  CO2 36*  --  35*  --  30  --  34* 35*  GLUCOSE 139*  --  203*  --  212*  --  195* 220*  BUN 161*  --  180*  --  191*  --  202* 202*  CREATININE 2.17*  --  2.38*  --  2.46*  --  2.44* 2.54*  CALCIUM 8.4*   < > 8.2*   < > 8.1*  --  8.4* 8.3*   < > = values in this interval not displayed.    Liver Function Tests: No results for input(s): AST, ALT, ALKPHOS, BILITOT, PROT, ALBUMIN in the last 168 hours. No results for input(s): LIPASE, AMYLASE in the last 168 hours. No results for input(s): AMMONIA in the last 168 hours.  CBC: Recent Labs  Lab 12/30/19 0452 01/02/20 0218  WBC 13.4* 22.2*  HGB 7.5* 7.4*  HCT 26.3* 25.0*  MCV 116.4* 114.2*  PLT 103* 100*    Cardiac Enzymes: No results for input(s): CKTOTAL, CKMB, CKMBINDEX, TROPONINI in the last 168 hours.  BNP: Invalid input(s): POCBNP  CBG: No results for input(s): GLUCAP in the last 168 hours.  Microbiology: Results for orders placed or performed during the hospital encounter of 11/25/2019   Culture, respiratory     Status: None   Collection Time: 11/29/19  9:21 AM   Specimen: Tracheal Aspirate; Respiratory  Result Value Ref Range Status   Specimen Description TRACHEAL ASPIRATE  Final   Special Requests NONE  Final   Gram Stain   Final    FEW WBC PRESENT, PREDOMINANTLY PMN ABUNDANT GRAM POSITIVE COCCI ABUNDANT GRAM POSITIVE RODS MODERATE GRAM NEGATIVE RODS    Culture   Final    RARE Consistent with normal respiratory flora. Performed at [facility] Lab, [address] [place], [place] [address]    Report Status 12/01/2019 FINAL  Final  Culture, respiratory (non-expectorated)     Status: None   Collection Time: 12/03/19  1:45 PM   Specimen: Tracheal Aspirate; Respiratory  Result Value Ref Range Status   Specimen Description TRACHEAL ASPIRATE  Final   Special Requests NONE  Final   Gram Stain   Final    RARE WBC PRESENT,BOTH PMN AND MONONUCLEAR RARE GRAM POSITIVE COCCI RARE YEAST RARE GRAM POSITIVE RODS Performed at [facility] Lab, [address], [place], [place] [address]    Culture FEW CANDIDA TROPICALIS  Final   Report Status 12/06/2019  FINAL  Final  Culture, respiratory (non-expectorated)     Status: None   Collection Time: 12/04/19  1:53 PM   Specimen: Tracheal Aspirate; Respiratory  Result Value Ref Range Status   Specimen Description TRACHEAL ASPIRATE  Final   Special Requests NONE  Final   Gram Stain   Final    RARE WBC PRESENT, PREDOMINANTLY PMN FEW GRAM POSITIVE COCCI IN PAIRS IN CLUSTERS RARE GRAM NEGATIVE RODS    Culture   Final    Consistent with normal respiratory flora. Performed at [facility], [address], [place], [place] [address]    Report Status 12/06/2019 FINAL  Final  Culture, blood (routine x 2)     Status: None   Collection Time: 12/04/19  2:26 PM   Specimen: BLOOD LEFT HAND  Result Value Ref Range Status   Specimen Description BLOOD LEFT HAND  Final   Special Requests   Final    BOTTLES DRAWN AEROBIC ONLY  Blood Culture results may not be optimal due to an inadequate volume of blood received in culture bottles   Culture   Final    NO GROWTH 5 DAYS Performed at [facility], [address], [place], [place] [address]    Report Status 12/09/2019 FINAL  Final  Culture, blood (routine x 2)     Status: None   Collection Time: 12/04/19  2:26 PM   Specimen: BLOOD LEFT HAND  Result Value Ref Range Status   Specimen Description BLOOD LEFT HAND  Final   Special Requests   Final    BOTTLES DRAWN AEROBIC ONLY Blood Culture results may not be optimal due to an inadequate volume of blood received in culture bottles   Culture   Final    NO GROWTH 5 DAYS Performed at [facility], [address], [place], [place] [address]    Report Status 12/09/2019 FINAL  Final  Culture, respiratory     Status: None   Collection Time: 12/13/19  9:20 AM   Specimen: Tracheal Aspirate; Respiratory  Result Value Ref Range Status   Specimen Description TRACHEAL ASPIRATE  Final   Special Requests NONE  Final   Gram Stain RARE WBC PRESENT, PREDOMINANTLY PMN RARE YEAST   Final   Culture   Final    RARE Consistent with normal respiratory flora. Performed at [facility], [address] [place], [place] [address]    Report Status 12/15/2019 FINAL  Final  Culture, blood (routine x 2)     Status: None   Collection Time: 12/17/19  4:46 PM   Specimen: BLOOD LEFT HAND  Result Value Ref Range Status   Specimen Description BLOOD LEFT HAND  Final   Special Requests   Final    BOTTLES DRAWN AEROBIC AND ANAEROBIC Blood Culture adequate volume   Culture   Final    NO GROWTH 5 DAYS Performed at [facility], [address] [place], [place] [address]    Report Status 12/22/2019 FINAL  Final  Culture, blood (routine x 2)     Status: None   Collection Time: 12/17/19  5:34 PM   Specimen: BLOOD LEFT HAND  Result Value Ref Range Status   Specimen Description BLOOD LEFT HAND  Final   Special Requests    Final    BOTTLES DRAWN AEROBIC AND ANAEROBIC Blood Culture adequate volume   Culture   Final    NO GROWTH 5 DAYS Performed at [facility], [address] [place], [place] [address]    Report Status 12/22/2019 FINAL  Final  Culture, respiratory     Status: None   Collection Time: 12/17/19  5:40 PM   Specimen: Tracheal Aspirate; Respiratory  Result Value Ref Range Status   Specimen Description TRACHEAL ASPIRATE  Final   Special Requests NONE  Final   Gram Stain   Final    FEW WBC PRESENT, PREDOMINANTLY PMN FEW YEAST Performed at [facility], [address] [place], [place] [address]    Culture FEW CANDIDA TROPICALIS  Final   Report Status 12/19/2019 FINAL  Final  Culture, respiratory (non-expectorated)     Status: None   Collection Time: 12/24/19 10:25 AM   Specimen: Tracheal Aspirate; Respiratory  Result Value Ref Range Status   Specimen Description TRACHEAL ASPIRATE  Final   Special Requests NONE  Final   Gram Stain   Final    NO WBC SEEN RARE BUDDING YEAST SEEN Performed at [facility], [address] [place], [place] [address]    Culture RARE CANDIDA TROPICALIS  Final   Report Status 12/26/2019 FINAL  Final    Coagulation Studies: No results for input(s): LABPROT, INR in the last 72 hours.  Urinalysis: No results for input(s): COLORURINE, LABSPEC, PHURINE, GLUCOSEU, HGBUR, BILIRUBINUR, KETONESUR, PROTEINUR, UROBILINOGEN, NITRITE, LEUKOCYTESUR in the last 72 hours.  Invalid input(s): APPERANCEUR    Imaging: No results found.   Medications:       Assessment/ Plan:  54 y.o. male a PMHx of recent COVID-19 infection with resultant respiratory failure status post tracheostomy placement, hypertension, hyperlipidemia, tobacco abuse, ARDS, PEG tube placement who was admitted to [facility] on 12/18/19 for ongoing treatment of acute respiratory failure.  1.  Acute kidney injury suspect most likely underlying ATN.  Patient with  significant azotemia with BUN of 180 and creatinine of 2.28 with urine output only of 700 cc.. -Patient remains critically ill.  Recommend daily dialysis for now Monday through Friday given ongoing fluid overload.  2.  Hyperkalemia.  Repeat serum potassium today.  3.  Acute respiratory failure.  Patient remains on the ventilator.  Hopefully oxygen can be weaned down with ongoing ultrafiltration.   LOS: 0 Prisma Decarlo 8/18/202112:30 PM

## 2020-01-05 DIAGNOSIS — N170 Acute kidney failure with tubular necrosis: Secondary | ICD-10-CM | POA: Diagnosis not present

## 2020-01-05 DIAGNOSIS — U071 COVID-19: Secondary | ICD-10-CM | POA: Diagnosis not present

## 2020-01-05 DIAGNOSIS — J9621 Acute and chronic respiratory failure with hypoxia: Secondary | ICD-10-CM | POA: Diagnosis not present

## 2020-01-05 DIAGNOSIS — J80 Acute respiratory distress syndrome: Secondary | ICD-10-CM | POA: Diagnosis not present

## 2020-01-05 LAB — CBC
HCT: 23.2 % — ABNORMAL LOW (ref 39.0–52.0)
Hemoglobin: 6.9 g/dL — CL (ref 13.0–17.0)
MCH: 32.4 pg (ref 26.0–34.0)
MCHC: 29.7 g/dL — ABNORMAL LOW (ref 30.0–36.0)
MCV: 108.9 fL — ABNORMAL HIGH (ref 80.0–100.0)
Platelets: 71 10*3/uL — ABNORMAL LOW (ref 150–400)
RBC: 2.13 MIL/uL — ABNORMAL LOW (ref 4.22–5.81)
RDW: 27.9 % — ABNORMAL HIGH (ref 11.5–15.5)
WBC: 13.7 10*3/uL — ABNORMAL HIGH (ref 4.0–10.5)
nRBC: 0.4 % — ABNORMAL HIGH (ref 0.0–0.2)

## 2020-01-05 LAB — BASIC METABOLIC PANEL
Anion gap: 7 (ref 5–15)
BUN: 92 mg/dL — ABNORMAL HIGH (ref 6–20)
CO2: 32 mmol/L (ref 22–32)
Calcium: 8 mg/dL — ABNORMAL LOW (ref 8.9–10.3)
Chloride: 97 mmol/L — ABNORMAL LOW (ref 98–111)
Creatinine, Ser: 1.59 mg/dL — ABNORMAL HIGH (ref 0.61–1.24)
GFR calc Af Amer: 55 mL/min — ABNORMAL LOW (ref >60)
GFR calc non Af Amer: 48 mL/min — ABNORMAL LOW (ref >60)
Glucose, Bld: 150 mg/dL — ABNORMAL HIGH (ref 70–99)
Potassium: 4.5 mmol/L (ref 3.5–5.1)
Sodium: 136 mmol/L (ref 135–145)

## 2020-01-05 LAB — PREPARE RBC (CROSSMATCH)

## 2020-01-05 NOTE — Progress Notes (Addendum)
Pulmonary Critical Care Medicine [facility]   PULMONARY CRITICAL CARE SERVICE  PROGRESS NOTE  Date of Service: 01/05/2020  Danny Leach  HCW:[id number]  DOB: Oct 07, 1965   DOA: 2019/12/04  Referring Physician: Carron Curie, MD  HPI: Danny Leach is a 54 y.o. male seen for follow up of Acute on Chronic Respiratory Failure.  Patient mains on full support pressure control mode rate of 35 and FiO2 of 55% has volumes in the 500s satting well no distress.  Medications: Reviewed on Rounds  Physical Exam:  Vitals: Pulse 91 respirations 33 BP 139/58 O2 sat 96% temp 98.0  Ventilator Settings ventilator mode pressure control mode rate of 35 respiratory pressure of 24 PEEP of 8 with an FiO2 of 55%  . General: Comfortable at this time . Eyes: Grossly normal lids, irises & conjunctiva . ENT: grossly tongue is normal . Neck: no obvious mass . Cardiovascular: S1 S2 normal no gallop . Respiratory: Coarse breath sounds . Abdomen: soft . Skin: no rash seen on limited exam . Musculoskeletal: not rigid . Psychiatric:unable to assess . Neurologic: no seizure no involuntary movements         Lab Data:   Basic Metabolic Panel: Recent Labs  Lab 01/01/20 1829 01/01/20 1829 01/01/20 2003 01/02/20 0218 01/02/20 0652 01/04/20 1238 01/05/20 0739  NA 138  --   --  140 142 138 136  K 7.4*   < > 6.9* 6.0* 5.6* 4.5 4.5  CL 98  --   --  95* 95* 98 97*  CO2 30  --   --  34* 35* 32 32  GLUCOSE 212*  --   --  195* 220* 152* 150*  BUN 191*  --   --  202* 202* 126* 92*  CREATININE 2.46*  --   --  2.44* 2.54* 1.77* 1.59*  CALCIUM 8.1*  --   --  8.4* 8.3* 8.3* 8.0*   < > = values in this interval not displayed.    ABG: No results for input(s): PHART, PCO2ART, PO2ART, HCO3, O2SAT in the last 168 hours.  Liver Function Tests: No results for input(s): AST, ALT, ALKPHOS, BILITOT, PROT, ALBUMIN in the last 168 hours. No results for input(s): LIPASE, AMYLASE in the last 168  hours. No results for input(s): AMMONIA in the last 168 hours.  CBC: Recent Labs  Lab 12/30/19 0452 01/02/20 0218 01/04/20 1238 01/05/20 0739  WBC 13.4* 22.2* 16.0* 13.7*  HGB 7.5* 7.4* 6.6* 6.9*  HCT 26.3* 25.0* 22.2* 23.2*  MCV 116.4* 114.2* 113.3* 108.9*  PLT 103* 100* 76* 71*    Cardiac Enzymes: No results for input(s): CKTOTAL, CKMB, CKMBINDEX, TROPONINI in the last 168 hours.  BNP (last 3 results) No results for input(s): BNP in the last 8760 hours.  ProBNP (last 3 results) No results for input(s): PROBNP in the last 8760 hours.  Radiological Exams: No results found.  Assessment/Plan Active Problems:   Acute on chronic respiratory failure with hypoxia (HCC)   COVID-19 virus infection   Acute respiratory distress syndrome (ARDS) due to COVID-19 virus (HCC)   Pneumonia due to COVID-19 virus   Acute renal failure due to tubular necrosis (HCC)   1. Acute on chronic respiratory failure with hypoxiacontinue on pressure control mode at this time we will continue to attempt weaning as tolerated continues to require PEEP of 8 with an FiO2 of 55% we will continue supportive measures and aggressive pulmonary toilet.  We will continue to attempt weaning FiO2 and  PEEP as tolerated. 2. COVID-19 virus infection in resolution we will continue to monitor 3. ARDS treated 4. Pneumonia due to COVID-19 treated clinically is improving 5. Acute renal failure tubular necrosiscontinue to monitor labs and follow nephrology recommendations.   I have personally seen and evaluated the patient, evaluated laboratory and imaging results, formulated the assessment and plan and placed orders. The Patient requires high complexity decision making with multiple systems involvement.  Rounds were done with the Respiratory Therapy Director and Staff therapists and discussed with nursing staff also.  Yevonne Pax, MD [facility] Pulmonary Critical Care Medicine Sleep Medicine

## 2020-01-05 NOTE — Progress Notes (Signed)
PROGRESS NOTE    Danny Leach  VWU:[id number] DOB: 27-Jun-1965 DOA: 11-Dec-2019   Brief Narrative:  Danny Leach is an 54 y.o. male his medical history significant of hypertension, hyperlipidemia who was diagnosed with COVID-19 infection 10/07/2019.  He was at home until 10/17/2019 when he started developing worsening shortness of breath, dizziness, increased work of breathing.  Patient presented to the acute facility and was initially admitted to the medical ICU.  CT scan showed multifocal pneumonia with bilateral mediastinal hilar lymphadenopathy.  He was given Tocilizumab x1 on 10/18/2019 and completed 15-day extended course of Decadron.  He was also diuresed.  Patient continued to have hypoxemia and was given Solu-Medrol as well.  On 11/06/2019 chest x-ray showed pulmonary edema and he was diuresed with some improvement.  On 11/06/2019 patient unfortunately desaturated into the 60s and was subsequently intubated and received multiple sedative drips for ventilator synchrony.  His hospital course was complicated with acute kidney injury at the outside facility.  On 11/17/2018 when he was started on empiric IV vancomycin and cefepime for pneumonia and he completed 7-day course of antibiotics.  His respiratory cultures after that were negative.  On 11/22/2018 when he underwent tracheostomy and PEG tube placement.  He unfortunately had encephalopathy.  However, head CT did not show any acute findings. He was transferred to [facility] and was admitted here on 12-11-2019 on prophylactic Bactrim and prednisone taper.  After admission here patient was found to be acidotic.  Pulmonary has been following the patient.  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  Patient started having high fevers with temperature of 101 on 12/04/2019.  He was started on empiric IV vancomycin, Zosyn.  In the interim he also received treatment with Levaquin.  Blood cultures from 12/04/2019 did not  show any growth in 4 days.  Tracheal aspirate cultures consistent with normal respiratory flora.  Patient remains intubated.  He is on 50% FiO2 with PEEP of 8. He has been restarted on IV cefepime, Flagyl. No respiratory cultures.   Assessment & Plan:  Acute on chronic respiratory failure with hypoxia  Multifocal pneumonia   COVID-19 virus infection   Acute respiratory distress syndrome (ARDS) due to COVID-19 virus  COVID-19 virus infection   Acute renal failure due to tubular necrosis  Systemic inflammatory response syndrome Leukocytosis Encephalopathy Sacrococcygeal pressure ulcer unstageable Dysphagia/malnutrition  Acute on chronic respiratory failure with hypoxemia: Initially started with COVID-19 infection.  He subsequently developed ARDS.  He status post treatment with Tocilizumab and Decadron at the outside facility. Received treatment with hydroxyurea, folic acid, prednisone.  Remains on the ventilator on 50% FiO2, PEEP of 8.  Respiratory culture previously showed normal flora which is highly concerning for ongoing aspiration.   He received treatment with IV vancomycin, Zosyn. Chest x-ray on 12/29/2019 per report increasing interstitial and airspace disease compatible with multifocal pneumonia versus ARDS. Now restarted on cefepime and Flagyl. No respiratory cultures. Pulmonary following.  If he is not improving suggest repeat chest imaging preferably chest CT to better evaluate and send respiratory cultures.  His previous respiratory cultures showed normal flora, this likely is highly suspicious for aspiration which is contributing to the worsening respiratory failure.  Plan to treat for tentative duration of 1 week pending improvement.  That being said, if he has ongoing aspiration then he is at risk for worsening pneumonia despite being on antibiotics.  Pneumonia: Patient initially had COVID-19 infection with pneumonia.  He is at risk for ventilator  associated pneumonia.  Remains on the  ventilator.  He is already received treatment with Tocilizumab, Decadron for the COVID-19 infection. Received treatment with hydroxyurea, folic acid, prednisone.  Also received empiric IV antibiotics for concern for bacterial pneumonia with IV vancomycin, Zosyn. Chest x-ray from 12/29/2019 showed worsening findings concerning for multifocal pneumonia versus ARDS. Now restarted on IV cefepime, Flagyl with a tentative plan to treat for 1 week pending improvement. Again, as mentioned above his previous respiratory cultures showed normal respiratory flora which is concerning for aspiration in which case he is high risk for worsening pneumonia despite being on antibiotics. Please monitor BUN/cr closely while on antibiotics.  Acute renal failure: Likely secondary to ATN.  Nephrology consulted. Now started on HD. Antibiotics renally dosed.  Avoid nephrotoxic medications.  Encephalopathy: Patient treated with acyclovir for suspicion of herpes encephalitis.  Head CT at the acute facility did not show any acute findings. Now probably uremia contributing to the encephalopathy. Started on dialysis. Already on antibiotics as mentioned above. Continue supportive management per the primary team.  COVID-19 infection: He already received treatment with Tocilizumab and Decadron as mentioned above.  Currently on prednisone.  Patient was also on prophylactic Bactrim while on the prednisone taper. He also has been having worsening renal failure which is likely multifactorial. Currently on hemodialysis.  Systemic inflammatory response syndrome with leukocytosis: On empiric antibiotics as mentioned above.  Blood cultures do not show any growth to date.  Continue to monitor.  Sacrococcygeal pressure ulcer unstageable: Continue local wound care.  Due to his debility and immobility he is very high risk for worsening of the wound.  If the wound is worsening consider consulting surgery for evaluation.  Dysphagia/malnutrition:  Management per primary team.  Due to his dysphagia he is at risk for aspiration and worsening respiratory failure secondary to aspiration pneumonia.  Due to his complex medical problems he is very high risk for worsening and decompensation.     Subjective: Started on dialysis. Chest x-ray showing worsening. Restarted on IV cefepime, Flagyl. Remains on the ventilator.  Objective: Vitals: Temperature 98.2, pulse 90, respiratory rate 38, blood pressure 138/63, pulse oximetry 94% on 50% FiO2, PEEP of 8  Examination: Constitutional: Ill-appearing male, opening eyes but not following commands Head: Atraumatic, normocephalic Eyes: PERLA ENMT: external ears and nose appear normal, Lips appears normal, poor dentition  Neck: has trach in place CVS: S1-S2 Respiratory: Rhonchi, no wheezing Abdomen: soft, nondistended, positive bowel sounds Musculoskeletal: Mild bilateral lower extremity edema Neuro: Patient with trach on the vent, opening eyes but not following commands.  Unable to do neurologic exam at this time. Psych: Unable to assess Skin: Unstageable sacrococcygeal pressure ulcer, no rashes   Data Reviewed: I have personally reviewed following labs and imaging studies  CBC: Recent Labs  Lab 12/30/19 0452 01/02/20 0218 01/04/20 1238 01/05/20 0739  WBC 13.4* 22.2* 16.0* 13.7*  HGB 7.5* 7.4* 6.6* 6.9*  HCT 26.3* 25.0* 22.2* 23.2*  MCV 116.4* 114.2* 113.3* 108.9*  PLT 103* 100* 76* 71*    Basic Metabolic Panel: Recent Labs  Lab 01/01/20 1829 01/01/20 1829 01/01/20 2003 01/02/20 0218 01/02/20 0652 01/04/20 1238 01/05/20 0739  NA 138  --   --  140 142 138 136  K 7.4*   < > 6.9* 6.0* 5.6* 4.5 4.5  CL 98  --   --  95* 95* 98 97*  CO2 30  --   --  34* 35* 32 32  GLUCOSE 212*  --   --  195* 220* 152* 150*  BUN 191*  --   --  202* 202* 126* 92*  CREATININE 2.46*  --   --  2.44* 2.54* 1.77* 1.59*  CALCIUM 8.1*  --   --  8.4* 8.3* 8.3* 8.0*   < > = values in this interval not  displayed.    GFR: CrCl cannot be calculated (Unknown ideal weight.).  Liver Function Tests: No results for input(s): AST, ALT, ALKPHOS, BILITOT, PROT, ALBUMIN in the last 168 hours.  CBG: No results for input(s): GLUCAP in the last 168 hours.   No results found for this or any previous visit (from the past 240 hour(s)).   Radiology Studies: No results found.  Scheduled Meds: Please see MAR  Vonzella Nipple, MD  01/05/2020, 5:55 PM

## 2020-01-06 DIAGNOSIS — U071 COVID-19: Secondary | ICD-10-CM | POA: Diagnosis not present

## 2020-01-06 DIAGNOSIS — J9621 Acute and chronic respiratory failure with hypoxia: Secondary | ICD-10-CM | POA: Diagnosis not present

## 2020-01-06 DIAGNOSIS — J80 Acute respiratory distress syndrome: Secondary | ICD-10-CM | POA: Diagnosis not present

## 2020-01-06 DIAGNOSIS — N170 Acute kidney failure with tubular necrosis: Secondary | ICD-10-CM | POA: Diagnosis not present

## 2020-01-06 LAB — TYPE AND SCREEN
ABO/RH(D): B POS
Antibody Screen: NEGATIVE
Unit division: 0
Unit division: 0

## 2020-01-06 LAB — CBC
HCT: 28.4 % — ABNORMAL LOW (ref 39.0–52.0)
Hemoglobin: 8.3 g/dL — ABNORMAL LOW (ref 13.0–17.0)
MCH: 31.3 pg (ref 26.0–34.0)
MCHC: 29.2 g/dL — ABNORMAL LOW (ref 30.0–36.0)
MCV: 107.2 fL — ABNORMAL HIGH (ref 80.0–100.0)
Platelets: 86 10*3/uL — ABNORMAL LOW (ref 150–400)
RBC: 2.65 MIL/uL — ABNORMAL LOW (ref 4.22–5.81)
RDW: 27.8 % — ABNORMAL HIGH (ref 11.5–15.5)
WBC: 19.2 10*3/uL — ABNORMAL HIGH (ref 4.0–10.5)
nRBC: 0.4 % — ABNORMAL HIGH (ref 0.0–0.2)

## 2020-01-06 LAB — BASIC METABOLIC PANEL
Anion gap: 11 (ref 5–15)
BUN: 119 mg/dL — ABNORMAL HIGH (ref 6–20)
CO2: 29 mmol/L (ref 22–32)
Calcium: 8.4 mg/dL — ABNORMAL LOW (ref 8.9–10.3)
Chloride: 96 mmol/L — ABNORMAL LOW (ref 98–111)
Creatinine, Ser: 2.01 mg/dL — ABNORMAL HIGH (ref 0.61–1.24)
GFR calc Af Amer: 42 mL/min — ABNORMAL LOW (ref >60)
GFR calc non Af Amer: 36 mL/min — ABNORMAL LOW (ref >60)
Glucose, Bld: 163 mg/dL — ABNORMAL HIGH (ref 70–99)
Potassium: 4.6 mmol/L (ref 3.5–5.1)
Sodium: 136 mmol/L (ref 135–145)

## 2020-01-06 LAB — RENAL FUNCTION PANEL
Albumin: 2.6 g/dL — ABNORMAL LOW (ref 3.5–5.0)
Anion gap: 11 (ref 5–15)
BUN: 120 mg/dL — ABNORMAL HIGH (ref 6–20)
CO2: 28 mmol/L (ref 22–32)
Calcium: 8.4 mg/dL — ABNORMAL LOW (ref 8.9–10.3)
Chloride: 96 mmol/L — ABNORMAL LOW (ref 98–111)
Creatinine, Ser: 2.03 mg/dL — ABNORMAL HIGH (ref 0.61–1.24)
GFR calc Af Amer: 41 mL/min — ABNORMAL LOW (ref >60)
GFR calc non Af Amer: 36 mL/min — ABNORMAL LOW (ref >60)
Glucose, Bld: 164 mg/dL — ABNORMAL HIGH (ref 70–99)
Phosphorus: 5.7 mg/dL — ABNORMAL HIGH (ref 2.5–4.6)
Potassium: 4.6 mmol/L (ref 3.5–5.1)
Sodium: 135 mmol/L (ref 135–145)

## 2020-01-06 LAB — BPAM RBC
Blood Product Expiration Date: 202108182359
Blood Product Expiration Date: 202109152359
ISSUE DATE / TIME: 202108181731
ISSUE DATE / TIME: 202108191510
Unit Type and Rh: 7300
Unit Type and Rh: 7300

## 2020-01-06 LAB — HEPARIN INDUCED PLATELET AB (HIT ANTIBODY): Heparin Induced Plt Ab: 0.36 OD (ref 0.000–0.400)

## 2020-01-06 NOTE — Progress Notes (Addendum)
Pulmonary Critical Care Medicine [facility] [place]   PULMONARY CRITICAL CARE SERVICE  PROGRESS NOTE  Date of Service: 01/06/2020  Danny Leach  VOZ:[id number]  DOB: [birth year]   DOA: 11-Dec-2019  Referring Physician: Carron Curie, MD  HPI: Danny Leach is a 54 y.o. male seen for follow up of Acute on Chronic Respiratory Failure. Patient remains on full support pressure control mode rate of 35 and FiO2 of 50% patient's trach has just been changed to a #7 [place] currently satting well no distress.  Medications: Reviewed on Rounds  Physical Exam:  Vitals: Pulse 79 respirations 19 BP 147/65 O2 sat 96% temp 98.1  Ventilator Settings ventilator mode AC PC rate of 35 respiratory pressure 24 PEEP of eight with an FiO2 of 50%  . General: Comfortable at this time . Eyes: Grossly normal lids, irises & conjunctiva . ENT: grossly tongue is normal . Neck: no obvious mass . Cardiovascular: S1 S2 normal no gallop . Respiratory: Coarse breath sounds . Abdomen: soft . Skin: no rash seen on limited exam . Musculoskeletal: not rigid . Psychiatric:unable to assess . Neurologic: no seizure no involuntary movements         Lab Data:   Basic Metabolic Panel: Recent Labs  Lab 01/02/20 0218 01/02/20 0652 01/04/20 1238 01/05/20 0739 01/06/20 0635  NA 140 142 138 136 135  136  K 6.0* 5.6* 4.5 4.5 4.6  4.6  CL 95* 95* 98 97* 96*  96*  CO2 34* 35* 32 32 28  29  GLUCOSE 195* 220* 152* 150* 164*  163*  BUN 202* 202* 126* 92* 120*  119*  CREATININE 2.44* 2.54* 1.77* 1.59* 2.03*  2.01*  CALCIUM 8.4* 8.3* 8.3* 8.0* 8.4*  8.4*  PHOS  --   --   --   --  5.7*    ABG: No results for input(s): PHART, PCO2ART, PO2ART, HCO3, O2SAT in the last 168 hours.  Liver Function Tests: Recent Labs  Lab 01/06/20 0635  ALBUMIN 2.6*   No results for input(s): LIPASE, AMYLASE in the last 168 hours. No results for input(s): AMMONIA in the last 168 hours.  CBC: Recent Labs   Lab 01/02/20 0218 01/04/20 1238 01/05/20 0739 01/06/20 0635  WBC 22.2* 16.0* 13.7* 19.2*  HGB 7.4* 6.6* 6.9* 8.3*  HCT 25.0* 22.2* 23.2* 28.4*  MCV 114.2* 113.3* 108.9* 107.2*  PLT 100* 76* 71* 86*    Cardiac Enzymes: No results for input(s): CKTOTAL, CKMB, CKMBINDEX, TROPONINI in the last 168 hours.  BNP (last 3 results) No results for input(s): BNP in the last 8760 hours.  ProBNP (last 3 results) No results for input(s): PROBNP in the last 8760 hours.  Radiological Exams: No results found.  Assessment/Plan Active Problems:   Acute on chronic respiratory failure with hypoxia (HCC)   COVID-19 virus infection   Acute respiratory distress syndrome (ARDS) due to COVID-19 virus (HCC)   Pneumonia due to COVID-19 virus   Acute renal failure due to tubular necrosis (HCC)   1. Acute on chronic respiratory failure with hypoxiapatient will continue on pressure control mode in an attempt to continue weaning. Currently on 50% FiO2 and that is the focus to decrease this. Continue supportive measures and aggressive pulmonary toilet. 2. COVID-19 virus infection in resolution we will continue to monitor 3. ARDS treated 4. Pneumonia due to COVID-19 treated clinically is improving 5. Acute renal failure tubular necrosiscontinue to monitor labs and follow nephrology recommendations.   I have personally seen and evaluated the  patient, evaluated laboratory and imaging results, formulated the assessment and plan and placed orders. The Patient requires high complexity decision making with multiple systems involvement.  Rounds were done with the Respiratory Therapy Director and Staff therapists and discussed with nursing staff also.  Allyne Gee, MD [facility] Pulmonary Critical Care Medicine Sleep Medicine

## 2020-01-06 NOTE — Progress Notes (Signed)
[facility]  ROUNDING NOTE   Subjective:  Patient seen during HD today. Tolerating well.  Still volume overloaded.     Objective:  Vital signs in last 24 hours:  Temperature 98.1 pulse 79 respirations 19 blood pressure 147/65  Physical Exam: General: Critically ill-appearing  Head: Normocephalic, atraumatic. Moist oral mucosal membranes  Eyes: Anicteric  Neck: Tracheostomy in place  Lungs:  Bilateral rhonchi, vent assisted  Heart: S1S2 no rubs  Abdomen:  Soft, nontender, bowel sounds present  Extremities: 3+ generalized edema  Neurologic: Currently not following commands  Skin: No lesions       Basic Metabolic Panel: Recent Labs  Lab 01/02/20 0218 01/02/20 0218 01/02/20 0652 01/02/20 0652 01/04/20 1238 01/05/20 0739 01/06/20 0635  NA 140  --  142  --  138 136 135  136  K 6.0*  --  5.6*  --  4.5 4.5 4.6  4.6  CL 95*  --  95*  --  98 97* 96*  96*  CO2 34*  --  35*  --  32 32 28  29  GLUCOSE 195*  --  220*  --  152* 150* 164*  163*  BUN 202*  --  202*  --  126* 92* 120*  119*  CREATININE 2.44*  --  2.54*  --  1.77* 1.59* 2.03*  2.01*  CALCIUM 8.4*   < > 8.3*   < > 8.3* 8.0* 8.4*  8.4*  PHOS  --   --   --   --   --   --  5.7*   < > = values in this interval not displayed.    Liver Function Tests: Recent Labs  Lab 01/06/20 0635  ALBUMIN 2.6*   No results for input(s): LIPASE, AMYLASE in the last 168 hours. No results for input(s): AMMONIA in the last 168 hours.  CBC: Recent Labs  Lab 01/02/20 0218 01/04/20 1238 01/05/20 0739 01/06/20 0635  WBC 22.2* 16.0* 13.7* 19.2*  HGB 7.4* 6.6* 6.9* 8.3*  HCT 25.0* 22.2* 23.2* 28.4*  MCV 114.2* 113.3* 108.9* 107.2*  PLT 100* 76* 71* 86*    Cardiac Enzymes: No results for input(s): CKTOTAL, CKMB, CKMBINDEX, TROPONINI in the last 168 hours.  BNP: Invalid input(s): POCBNP  CBG: No results for input(s): GLUCAP in the last 168 hours.  Microbiology: Results for orders placed or  performed during the hospital encounter of 12/08/2019  Culture, respiratory     Status: None   Collection Time: 11/29/19  9:21 AM   Specimen: Tracheal Aspirate; Respiratory  Result Value Ref Range Status   Specimen Description TRACHEAL ASPIRATE  Final   Special Requests NONE  Final   Gram Stain   Final    FEW WBC PRESENT, PREDOMINANTLY PMN ABUNDANT GRAM POSITIVE COCCI ABUNDANT GRAM POSITIVE RODS MODERATE GRAM NEGATIVE RODS    Culture   Final    RARE Consistent with normal respiratory flora. Performed at [facility], [address] [place], [place] [address]    Report Status 12/01/2019 FINAL  Final  Culture, respiratory (non-expectorated)     Status: None   Collection Time: 12/03/19  1:45 PM   Specimen: Tracheal Aspirate; Respiratory  Result Value Ref Range Status   Specimen Description TRACHEAL ASPIRATE  Final   Special Requests NONE  Final   Gram Stain   Final    RARE WBC PRESENT,BOTH PMN AND MONONUCLEAR RARE GRAM POSITIVE COCCI RARE YEAST RARE GRAM POSITIVE RODS Performed at [facility], [address] [place], [place] [address]    Culture FEW CANDIDA TROPICALIS  Final   Report Status 12/06/2019 FINAL  Final  Culture, respiratory (non-expectorated)     Status: None   Collection Time: 12/04/19  1:53 PM   Specimen: Tracheal Aspirate; Respiratory  Result Value Ref Range Status   Specimen Description TRACHEAL ASPIRATE  Final   Special Requests NONE  Final   Gram Stain   Final    RARE WBC PRESENT, PREDOMINANTLY PMN FEW GRAM POSITIVE COCCI IN PAIRS IN CLUSTERS RARE GRAM NEGATIVE RODS    Culture   Final    Consistent with normal respiratory flora. Performed at [facility], [address] [place], [place] [address]    Report Status 12/06/2019 FINAL  Final  Culture, blood (routine x 2)     Status: None   Collection Time: 12/04/19  2:26 PM   Specimen: BLOOD LEFT HAND  Result Value Ref Range Status   Specimen Description BLOOD LEFT HAND  Final    Special Requests   Final    BOTTLES DRAWN AEROBIC ONLY Blood Culture results may not be optimal due to an inadequate volume of blood received in culture bottles   Culture   Final    NO GROWTH 5 DAYS Performed at [facility], [address] [place], [place] [address]    Report Status 12/09/2019 FINAL  Final  Culture, blood (routine x 2)     Status: None   Collection Time: 12/04/19  2:26 PM   Specimen: BLOOD LEFT HAND  Result Value Ref Range Status   Specimen Description BLOOD LEFT HAND  Final   Special Requests   Final    BOTTLES DRAWN AEROBIC ONLY Blood Culture results may not be optimal due to an inadequate volume of blood received in culture bottles   Culture   Final    NO GROWTH 5 DAYS Performed at [facility], [address], [place], [place] [address]    Report Status 12/09/2019 FINAL  Final  Culture, respiratory     Status: None   Collection Time: 12/13/19  9:20 AM   Specimen: Tracheal Aspirate; Respiratory  Result Value Ref Range Status   Specimen Description TRACHEAL ASPIRATE  Final   Special Requests NONE  Final   Gram Stain RARE WBC PRESENT, PREDOMINANTLY PMN RARE YEAST   Final   Culture   Final    RARE Consistent with normal respiratory flora. Performed at [facility], [address], [place], [place] [address]    Report Status 12/15/2019 FINAL  Final  Culture, blood (routine x 2)     Status: None   Collection Time: 12/17/19  4:46 PM   Specimen: BLOOD LEFT HAND  Result Value Ref Range Status   Specimen Description BLOOD LEFT HAND  Final   Special Requests   Final    BOTTLES DRAWN AEROBIC AND ANAEROBIC Blood Culture adequate volume   Culture   Final    NO GROWTH 5 DAYS Performed at [facility], [address], [place], [place] [address]    Report Status 12/22/2019 FINAL  Final  Culture, blood (routine x 2)     Status: None   Collection Time: 12/17/19  5:34 PM   Specimen: BLOOD LEFT HAND  Result Value Ref Range Status   Specimen  Description BLOOD LEFT HAND  Final   Special Requests   Final    BOTTLES DRAWN AEROBIC AND ANAEROBIC Blood Culture adequate volume  Culture   Final    NO GROWTH 5 DAYS Performed at [facility], [address], [place], [place] [address]    Report Status 12/22/2019 FINAL  Final  Culture, respiratory     Status: None   Collection Time: 12/17/19  5:40 PM   Specimen: Tracheal Aspirate; Respiratory  Result Value Ref Range Status   Specimen Description TRACHEAL ASPIRATE  Final   Special Requests NONE  Final   Gram Stain   Final    FEW WBC PRESENT, PREDOMINANTLY PMN FEW YEAST Performed at [facility], [address] [place], [place] [address]    Culture FEW CANDIDA TROPICALIS  Final   Report Status 12/19/2019 FINAL  Final  Culture, respiratory (non-expectorated)     Status: None   Collection Time: 12/24/19 10:25 AM   Specimen: Tracheal Aspirate; Respiratory  Result Value Ref Range Status   Specimen Description TRACHEAL ASPIRATE  Final   Special Requests NONE  Final   Gram Stain   Final    NO WBC SEEN RARE BUDDING YEAST SEEN Performed at [facility], [address] [place], [place] [address]    Culture RARE CANDIDA TROPICALIS  Final   Report Status 12/26/2019 FINAL  Final    Coagulation Studies: No results for input(s): LABPROT, INR in the last 72 hours.  Urinalysis: No results for input(s): COLORURINE, LABSPEC, PHURINE, GLUCOSEU, HGBUR, BILIRUBINUR, KETONESUR, PROTEINUR, UROBILINOGEN, NITRITE, LEUKOCYTESUR in the last 72 hours.  Invalid input(s): APPERANCEUR    Imaging: No results found.   Medications:       Assessment/ Plan:  54 y.o. male a PMHx of recent COVID-19 infection with resultant respiratory failure status post tracheostomy placement, hypertension, hyperlipidemia, tobacco abuse, ARDS, PEG tube placement who was admitted to [facility] on 11/22/2019 for ongoing treatment of acute respiratory failure.  1.  Acute kidney injury  suspect most likely underlying ATN.  Patient with significant azotemia with BUN of 180 and creatinine of 2.28 with urine output only of 700 cc. -Patient seen and evaluated during dialysis.  Undergoing daily dialysis treatment at this time Monday through Friday.  2.  Hyperkalemia.  Potassium now normalized to 4.6.  Continue to monitor.  3.  Acute respiratory failure.  Hopefully O2 can be weaned down as we continue to perform ultrafiltration.   LOS: 0 Elayna Tobler 8/20/20218:46 AM

## 2020-01-07 DIAGNOSIS — N170 Acute kidney failure with tubular necrosis: Secondary | ICD-10-CM | POA: Diagnosis not present

## 2020-01-07 DIAGNOSIS — J9621 Acute and chronic respiratory failure with hypoxia: Secondary | ICD-10-CM | POA: Diagnosis not present

## 2020-01-07 DIAGNOSIS — J80 Acute respiratory distress syndrome: Secondary | ICD-10-CM | POA: Diagnosis not present

## 2020-01-07 DIAGNOSIS — U071 COVID-19: Secondary | ICD-10-CM | POA: Diagnosis not present

## 2020-01-07 LAB — BLOOD GAS, ARTERIAL
Acid-Base Excess: 2 mmol/L (ref 0.0–2.0)
Bicarbonate: 28.4 mmol/L — ABNORMAL HIGH (ref 20.0–28.0)
FIO2: 60
O2 Saturation: 97 %
Patient temperature: 37
pCO2 arterial: 64.9 mmHg — ABNORMAL HIGH (ref 32.0–48.0)
pH, Arterial: 7.264 — ABNORMAL LOW (ref 7.350–7.450)
pO2, Arterial: 78.2 mmHg — ABNORMAL LOW (ref 83.0–108.0)

## 2020-01-07 NOTE — Progress Notes (Addendum)
Pulmonary Critical Care Medicine [facility] [place]   PULMONARY CRITICAL CARE SERVICE  PROGRESS NOTE  Date of Service: 01/07/2020  Danny Leach  OXB:[id number]  DOB: 07-06-1965   DOA: 26-Dec-2019  Referring Physician: Carron Curie, MD  HPI: Danny Leach is a 54 y.o. male seen for follow up of Acute on Chronic Respiratory Failure.  Patient continues on full support on the vent at this time has a copious amount of secretions currently on a rate of 35 and FiO2 of 60% FiO2 was increased due to desaturations earlier today.  Medications: Reviewed on Rounds  Physical Exam:  Vitals: Pulse 92 respirations 36 BP 141/66 O2 sat 93% temp 97.3  Ventilator Settings ventilator mode AC PC rate of 35 inspiratory pressure 24 PEEP of 8 with an FiO2 of 60%  . General: Comfortable at this time . Eyes: Grossly normal lids, irises & conjunctiva . ENT: grossly tongue is normal . Neck: no obvious mass . Cardiovascular: S1 S2 normal no gallop . Respiratory: Coarse breath sounds . Abdomen: soft . Skin: no rash seen on limited exam . Musculoskeletal: not rigid . Psychiatric:unable to assess . Neurologic: no seizure no involuntary movements         Lab Data:   Basic Metabolic Panel: Recent Labs  Lab 01/02/20 0218 01/02/20 0652 01/04/20 1238 01/05/20 0739 01/06/20 0635  NA 140 142 138 136 135  136  K 6.0* 5.6* 4.5 4.5 4.6  4.6  CL 95* 95* 98 97* 96*  96*  CO2 34* 35* 32 32 28  29  GLUCOSE 195* 220* 152* 150* 164*  163*  BUN 202* 202* 126* 92* 120*  119*  CREATININE 2.44* 2.54* 1.77* 1.59* 2.03*  2.01*  CALCIUM 8.4* 8.3* 8.3* 8.0* 8.4*  8.4*  PHOS  --   --   --   --  5.7*    ABG: Recent Labs  Lab 01/07/20 1235  PHART 7.264*  PCO2ART 64.9*  PO2ART 78.2*  HCO3 28.4*  O2SAT 97.0    Liver Function Tests: Recent Labs  Lab 01/06/20 0635  ALBUMIN 2.6*   No results for input(s): LIPASE, AMYLASE in the last 168 hours. No results for input(s): AMMONIA in  the last 168 hours.  CBC: Recent Labs  Lab 01/02/20 0218 01/04/20 1238 01/05/20 0739 01/06/20 0635  WBC 22.2* 16.0* 13.7* 19.2*  HGB 7.4* 6.6* 6.9* 8.3*  HCT 25.0* 22.2* 23.2* 28.4*  MCV 114.2* 113.3* 108.9* 107.2*  PLT 100* 76* 71* 86*    Cardiac Enzymes: No results for input(s): CKTOTAL, CKMB, CKMBINDEX, TROPONINI in the last 168 hours.  BNP (last 3 results) No results for input(s): BNP in the last 8760 hours.  ProBNP (last 3 results) No results for input(s): PROBNP in the last 8760 hours.  Radiological Exams: No results found.  Assessment/Plan Active Problems:   Acute on chronic respiratory failure with hypoxia (HCC)   COVID-19 virus infection   Acute respiratory distress syndrome (ARDS) due to COVID-19 virus (HCC)   Pneumonia due to COVID-19 virus   Acute renal failure due to tubular necrosis (HCC)   1. Acute on chronic respiratory failure with hypoxiapatient will remain on full support on the vent FiO2 was increased to 60% today we will continue to attempt weaning that down as patient can tolerate copious amount of secretions still noted continue secretion management pulmonary toilet. 2. COVID-19 virus infection in resolution we will continue to monitor 3. ARDS treated 4. Pneumonia due to COVID-19 treated clinically is improving 5.  Acute renal failure tubular necrosiscontinue to monitor labs and follow nephrology recommendations.   I have personally seen and evaluated the patient, evaluated laboratory and imaging results, formulated the assessment and plan and placed orders. The Patient requires high complexity decision making with multiple systems involvement.  Rounds were done with the Respiratory Therapy Director and Staff therapists and discussed with nursing staff also.  Yevonne Pax, MD [facility] Pulmonary Critical Care Medicine Sleep Medicine

## 2020-01-08 DIAGNOSIS — N170 Acute kidney failure with tubular necrosis: Secondary | ICD-10-CM | POA: Diagnosis not present

## 2020-01-08 DIAGNOSIS — J80 Acute respiratory distress syndrome: Secondary | ICD-10-CM | POA: Diagnosis not present

## 2020-01-08 DIAGNOSIS — U071 COVID-19: Secondary | ICD-10-CM | POA: Diagnosis not present

## 2020-01-08 DIAGNOSIS — J9621 Acute and chronic respiratory failure with hypoxia: Secondary | ICD-10-CM | POA: Diagnosis not present

## 2020-01-08 LAB — BLOOD GAS, ARTERIAL
Acid-base deficit: 2.1 mmol/L — ABNORMAL HIGH (ref 0.0–2.0)
Acid-base deficit: 1.3 mmol/L (ref 0.0–2.0)
Bicarbonate: 26 mmol/L (ref 20.0–28.0)
Bicarbonate: 26.2 mmol/L (ref 20.0–28.0)
FIO2: 60
FIO2: 50
O2 Saturation: 99.4 %
O2 Saturation: 96.5 %
Patient temperature: 36.4
Patient temperature: 36
pCO2 arterial: 78.1 mmHg (ref 32.0–48.0)
pCO2 arterial: 70.1 mmHg (ref 32.0–48.0)
pH, Arterial: 7.144 — CL (ref 7.350–7.450)
pH, Arterial: 7.191 — CL (ref 7.350–7.450)
pO2, Arterial: 153 mmHg — ABNORMAL HIGH (ref 83.0–108.0)
pO2, Arterial: 76.1 mmHg — ABNORMAL LOW (ref 83.0–108.0)

## 2020-01-08 NOTE — Progress Notes (Addendum)
Pulmonary Critical Care Medicine [facility] [place]   PULMONARY CRITICAL CARE SERVICE  PROGRESS NOTE  Date of Service: 01/08/2020  Danny Leach  [id number]  DOB: 21-Mar-1966   DOA: 12/15/2019  Referring Physician: Carron Curie, MD  HPI: Danny Leach is a 54 y.o. male seen for follow up of Acute on Chronic Respiratory Failure.  Patient continues on full support on the vent pressure control mode rate of 35 and FiO2 of 45% unable to wean today.  Medications: Reviewed on Rounds  Physical Exam:  Vitals: Pulse 79 respirations 41 BP 117/57 O2 sat 98% temp 97.6  Ventilator Settings ventilator mode AC PC rate of 35 inspiratory pressure of 32 PEEP of 8 and FiO2 of 45%  . General: Comfortable at this time . Eyes: Grossly normal lids, irises & conjunctiva . ENT: grossly tongue is normal . Neck: no obvious mass . Cardiovascular: S1 S2 normal no gallop . Respiratory: Coarse breath sounds . Abdomen: soft . Skin: no rash seen on limited exam . Musculoskeletal: not rigid . Psychiatric:unable to assess . Neurologic: no seizure no involuntary movements         Lab Data:   Basic Metabolic Panel: Recent Labs  Lab 01/02/20 0218 01/02/20 0652 01/04/20 1238 01/05/20 0739 01/06/20 0635  NA 140 142 138 136 135  136  K 6.0* 5.6* 4.5 4.5 4.6  4.6  CL 95* 95* 98 97* 96*  96*  CO2 34* 35* 32 32 28  29  GLUCOSE 195* 220* 152* 150* 164*  163*  BUN 202* 202* 126* 92* 120*  119*  CREATININE 2.44* 2.54* 1.77* 1.59* 2.03*  2.01*  CALCIUM 8.4* 8.3* 8.3* 8.0* 8.4*  8.4*  PHOS  --   --   --   --  5.7*    ABG: Recent Labs  Lab 01/07/20 1235 01/08/20 0520 01/08/20 0700  PHART 7.264* 7.144* 7.191*  PCO2ART 64.9* 78.1* 70.1*  PO2ART 78.2* 153* 76.1*  HCO3 28.4* 26.0 26.2  O2SAT 97.0 99.4 96.5    Liver Function Tests: Recent Labs  Lab 01/06/20 0635  ALBUMIN 2.6*   No results for input(s): LIPASE, AMYLASE in the last 168 hours. No results for input(s):  AMMONIA in the last 168 hours.  CBC: Recent Labs  Lab 01/02/20 0218 01/04/20 1238 01/05/20 0739 01/06/20 0635  WBC 22.2* 16.0* 13.7* 19.2*  HGB 7.4* 6.6* 6.9* 8.3*  HCT 25.0* 22.2* 23.2* 28.4*  MCV 114.2* 113.3* 108.9* 107.2*  PLT 100* 76* 71* 86*    Cardiac Enzymes: No results for input(s): CKTOTAL, CKMB, CKMBINDEX, TROPONINI in the last 168 hours.  BNP (last 3 results) No results for input(s): BNP in the last 8760 hours.  ProBNP (last 3 results) No results for input(s): PROBNP in the last 8760 hours.  Radiological Exams: No results found.  Assessment/Plan Active Problems:   Acute on chronic respiratory failure with hypoxia (HCC)   COVID-19 virus infection   Acute respiratory distress syndrome (ARDS) due to COVID-19 virus (HCC)   Pneumonia due to COVID-19 virus   Acute renal failure due to tubular necrosis (HCC)   1. Acute on chronic respiratory failure with hypoxiapatient will remain on full support not weaning at this time see pressure settings above.  Patient is down to 45% FiO2 we will continue supportive measures and aggressive pulmonary toilet. 2. COVID-19 virus infection in resolution we will continue to monitor 3. ARDS treated 4. Pneumonia due to COVID-19 treated clinically is improving 5. Acute renal failure tubular necrosiscontinue to  monitor labs and follow nephrology recommendations.   I have personally seen and evaluated the patient, evaluated laboratory and imaging results, formulated the assessment and plan and placed orders. The Patient requires high complexity decision making with multiple systems involvement.  Rounds were done with the Respiratory Therapy Director and Staff therapists and discussed with nursing staff also.  Yevonne Pax, MD [facility] Pulmonary Critical Care Medicine Sleep Medicine

## 2020-01-09 ENCOUNTER — Other Ambulatory Visit (HOSPITAL_COMMUNITY): Payer: BLUE CROSS/BLUE SHIELD

## 2020-01-09 DIAGNOSIS — J9621 Acute and chronic respiratory failure with hypoxia: Secondary | ICD-10-CM | POA: Diagnosis not present

## 2020-01-09 DIAGNOSIS — J80 Acute respiratory distress syndrome: Secondary | ICD-10-CM | POA: Diagnosis not present

## 2020-01-09 DIAGNOSIS — N170 Acute kidney failure with tubular necrosis: Secondary | ICD-10-CM | POA: Diagnosis not present

## 2020-01-09 DIAGNOSIS — U071 COVID-19: Secondary | ICD-10-CM | POA: Diagnosis not present

## 2020-01-09 LAB — BLOOD GAS, ARTERIAL
Acid-base deficit: 1.8 mmol/L (ref 0.0–2.0)
Bicarbonate: 25.2 mmol/L (ref 20.0–28.0)
FIO2: 45
O2 Saturation: 94.5 %
Patient temperature: 37
pCO2 arterial: 66.1 mmHg (ref 32.0–48.0)
pH, Arterial: 7.206 — ABNORMAL LOW (ref 7.350–7.450)
pO2, Arterial: 68.2 mmHg — ABNORMAL LOW (ref 83.0–108.0)

## 2020-01-09 LAB — BASIC METABOLIC PANEL
Anion gap: 13 (ref 5–15)
BUN: 157 mg/dL — ABNORMAL HIGH (ref 6–20)
CO2: 23 mmol/L (ref 22–32)
Calcium: 8.4 mg/dL — ABNORMAL LOW (ref 8.9–10.3)
Chloride: 92 mmol/L — ABNORMAL LOW (ref 98–111)
Creatinine, Ser: 2.6 mg/dL — ABNORMAL HIGH (ref 0.61–1.24)
GFR calc Af Amer: 31 mL/min — ABNORMAL LOW (ref >60)
GFR calc non Af Amer: 26 mL/min — ABNORMAL LOW (ref >60)
Glucose, Bld: 157 mg/dL — ABNORMAL HIGH (ref 70–99)
Potassium: 4.2 mmol/L (ref 3.5–5.1)
Sodium: 128 mmol/L — ABNORMAL LOW (ref 135–145)

## 2020-01-09 LAB — RENAL FUNCTION PANEL
Albumin: 2 g/dL — ABNORMAL LOW (ref 3.5–5.0)
Anion gap: 12 (ref 5–15)
BUN: 155 mg/dL — ABNORMAL HIGH (ref 6–20)
CO2: 24 mmol/L (ref 22–32)
Calcium: 8.4 mg/dL — ABNORMAL LOW (ref 8.9–10.3)
Chloride: 91 mmol/L — ABNORMAL LOW (ref 98–111)
Creatinine, Ser: 2.53 mg/dL — ABNORMAL HIGH (ref 0.61–1.24)
GFR calc Af Amer: 32 mL/min — ABNORMAL LOW (ref >60)
GFR calc non Af Amer: 27 mL/min — ABNORMAL LOW (ref >60)
Glucose, Bld: 156 mg/dL — ABNORMAL HIGH (ref 70–99)
Phosphorus: 3.5 mg/dL (ref 2.5–4.6)
Potassium: 4.1 mmol/L (ref 3.5–5.1)
Sodium: 127 mmol/L — ABNORMAL LOW (ref 135–145)

## 2020-01-09 LAB — CBC
HCT: 25 % — ABNORMAL LOW (ref 39.0–52.0)
Hemoglobin: 7.5 g/dL — ABNORMAL LOW (ref 13.0–17.0)
MCH: 31.1 pg (ref 26.0–34.0)
MCHC: 30 g/dL (ref 30.0–36.0)
MCV: 103.7 fL — ABNORMAL HIGH (ref 80.0–100.0)
Platelets: 76 10*3/uL — ABNORMAL LOW (ref 150–400)
RBC: 2.41 MIL/uL — ABNORMAL LOW (ref 4.22–5.81)
RDW: 24.5 % — ABNORMAL HIGH (ref 11.5–15.5)
WBC: 15.4 10*3/uL — ABNORMAL HIGH (ref 4.0–10.5)
nRBC: 0.5 % — ABNORMAL HIGH (ref 0.0–0.2)

## 2020-01-09 NOTE — Progress Notes (Signed)
[facility]  ROUNDING NOTE   Subjective:  Pt seen and evaluated at bedside. Still critically ill.  Requiring mechanical ventilation.    Objective:  Vital signs in last 24 hours:  Temperature 96  pulse 83 respirations 16 blood pressure 106/70  Physical Exam: General: Critically ill-appearing  Head: Normocephalic, atraumatic. Moist oral mucosal membranes  Eyes: Anicteric  Neck: Tracheostomy in place  Lungs:  Bilateral rhonchi, vent assisted  Heart: S1S2 no rubs  Abdomen:  Soft, nontender, bowel sounds present  Extremities: 3+ generalized edema  Neurologic: Currently not following commands  Skin: No lesions       Basic Metabolic Panel: Recent Labs  Lab 01/04/20 1238 01/04/20 1238 01/05/20 0739 01/06/20 0635 01/09/20 0825  NA 138  --  136 135  136 127*  128*  K 4.5  --  4.5 4.6  4.6 4.1  4.2  CL 98  --  97* 96*  96* 91*  92*  CO2 32  --  32 28  29 24  23   GLUCOSE 152*  --  150* 164*  163* 156*  157*  BUN 126*  --  92* 120*  119* 155*  157*  CREATININE 1.77*  --  1.59* 2.03*  2.01* 2.53*  2.60*  CALCIUM 8.3*   < > 8.0* 8.4*  8.4* 8.4*  8.4*  PHOS  --   --   --  5.7* 3.5   < > = values in this interval not displayed.    Liver Function Tests: Recent Labs  Lab 01/06/20 0635 01/09/20 0825  ALBUMIN 2.6* 2.0*   No results for input(s): LIPASE, AMYLASE in the last 168 hours. No results for input(s): AMMONIA in the last 168 hours.  CBC: Recent Labs  Lab 01/04/20 1238 01/05/20 0739 01/06/20 0635 01/09/20 0825  WBC 16.0* 13.7* 19.2* 15.4*  HGB 6.6* 6.9* 8.3* 7.5*  HCT 22.2* 23.2* 28.4* 25.0*  MCV 113.3* 108.9* 107.2* 103.7*  PLT 76* 71* 86* 76*    Cardiac Enzymes: No results for input(s): CKTOTAL, CKMB, CKMBINDEX, TROPONINI in the last 168 hours.  BNP: Invalid input(s): POCBNP  CBG: No results for input(s): GLUCAP in the last 168 hours.  Microbiology: Results for orders placed or performed during the hospital encounter of  12/11/2019  Culture, respiratory     Status: None   Collection Time: 11/29/19  9:21 AM   Specimen: Tracheal Aspirate; Respiratory  Result Value Ref Range Status   Specimen Description TRACHEAL ASPIRATE  Final   Special Requests NONE  Final   Gram Stain   Final    FEW WBC PRESENT, PREDOMINANTLY PMN ABUNDANT GRAM POSITIVE COCCI ABUNDANT GRAM POSITIVE RODS MODERATE GRAM NEGATIVE RODS    Culture   Final    RARE Consistent with normal respiratory flora. Performed at [facility], [address], [place], [place]    Report Status 12/01/2019 FINAL  Final  Culture, respiratory (non-expectorated)     Status: None   Collection Time: 12/03/19  1:45 PM   Specimen: Tracheal Aspirate; Respiratory  Result Value Ref Range Status   Specimen Description TRACHEAL ASPIRATE  Final   Special Requests NONE  Final   Gram Stain   Final    RARE WBC PRESENT,BOTH PMN AND MONONUCLEAR RARE GRAM POSITIVE COCCI RARE YEAST RARE GRAM POSITIVE RODS Performed at [facility], [address] [place], [place]    Culture FEW CANDIDA TROPICALIS  Final   Report Status 12/06/2019 FINAL  Final  Culture, respiratory (non-expectorated)     Status: None   Collection Time: 12/04/19  1:53 PM   Specimen: Tracheal Aspirate; Respiratory  Result Value Ref Range Status   Specimen Description TRACHEAL ASPIRATE  Final   Special Requests NONE  Final   Gram Stain   Final    RARE WBC PRESENT, PREDOMINANTLY PMN FEW GRAM POSITIVE COCCI IN PAIRS IN CLUSTERS RARE GRAM NEGATIVE RODS    Culture   Final    Consistent with normal respiratory flora. Performed at [facility], [address] [place], [place] [address]    Report Status 12/06/2019 FINAL  Final  Culture, blood (routine x 2)     Status: None   Collection Time: 12/04/19  2:26 PM   Specimen: BLOOD LEFT HAND  Result Value Ref Range Status   Specimen Description BLOOD LEFT HAND  Final   Special Requests   Final    BOTTLES DRAWN  AEROBIC ONLY Blood Culture results may not be optimal due to an inadequate volume of blood received in culture bottles   Culture   Final    NO GROWTH 5 DAYS Performed at [facility], [address], [place], [place] [address]    Report Status 12/09/2019 FINAL  Final  Culture, blood (routine x 2)     Status: None   Collection Time: 12/04/19  2:26 PM   Specimen: BLOOD LEFT HAND  Result Value Ref Range Status   Specimen Description BLOOD LEFT HAND  Final   Special Requests   Final    BOTTLES DRAWN AEROBIC ONLY Blood Culture results may not be optimal due to an inadequate volume of blood received in culture bottles   Culture   Final    NO GROWTH 5 DAYS Performed at [facility], [address], [place], [place] [address]    Report Status 12/09/2019 FINAL  Final  Culture, respiratory     Status: None   Collection Time: 12/13/19  9:20 AM   Specimen: Tracheal Aspirate; Respiratory  Result Value Ref Range Status   Specimen Description TRACHEAL ASPIRATE  Final   Special Requests NONE  Final   Gram Stain RARE WBC PRESENT, PREDOMINANTLY PMN RARE YEAST   Final   Culture   Final    RARE Consistent with normal respiratory flora. Performed at [facility], [address] [place], [place] [address]    Report Status 12/15/2019 FINAL  Final  Culture, blood (routine x 2)     Status: None   Collection Time: 12/17/19  4:46 PM   Specimen: BLOOD LEFT HAND  Result Value Ref Range Status   Specimen Description BLOOD LEFT HAND  Final   Special Requests   Final    BOTTLES DRAWN AEROBIC AND ANAEROBIC Blood Culture adequate volume   Culture   Final    NO GROWTH 5 DAYS Performed at [facility], [address], [place], [place] [address]    Report Status 12/22/2019 FINAL  Final  Culture, blood (routine x 2)     Status: None   Collection Time: 12/17/19  5:34 PM   Specimen: BLOOD LEFT HAND  Result Value Ref Range Status   Specimen Description BLOOD LEFT HAND  Final    Special Requests   Final    BOTTLES DRAWN AEROBIC AND ANAEROBIC Blood Culture adequate volume   Culture   Final    NO GROWTH 5 DAYS Performed at [facility], [address], [place],  [place] [address]    Report Status 12/22/2019 FINAL  Final  Culture, respiratory     Status: None   Collection Time: 12/17/19  5:40 PM   Specimen: Tracheal Aspirate; Respiratory  Result Value Ref Range Status   Specimen Description TRACHEAL ASPIRATE  Final   Special Requests NONE  Final   Gram Stain   Final    FEW WBC PRESENT, PREDOMINANTLY PMN FEW YEAST Performed at [facility], [address], [place], [place] [address]    Culture FEW CANDIDA TROPICALIS  Final   Report Status 12/19/2019 FINAL  Final  Culture, respiratory (non-expectorated)     Status: None   Collection Time: 12/24/19 10:25 AM   Specimen: Tracheal Aspirate; Respiratory  Result Value Ref Range Status   Specimen Description TRACHEAL ASPIRATE  Final   Special Requests NONE  Final   Gram Stain   Final    NO WBC SEEN RARE BUDDING YEAST SEEN Performed at [facility], [address], [place], [place] [address]    Culture RARE CANDIDA TROPICALIS  Final   Report Status 12/26/2019 FINAL  Final    Coagulation Studies: No results for input(s): LABPROT, INR in the last 72 hours.  Urinalysis: No results for input(s): COLORURINE, LABSPEC, PHURINE, GLUCOSEU, HGBUR, BILIRUBINUR, KETONESUR, PROTEINUR, UROBILINOGEN, NITRITE, LEUKOCYTESUR in the last 72 hours.  Invalid input(s): APPERANCEUR    Imaging: DG CHEST PORT 1 VIEW  Result Date: 01/09/2020 CLINICAL DATA:  Respiratory failure. EXAM: PORTABLE CHEST 1 VIEW COMPARISON:  12/29/2019 FINDINGS: The tracheostomy tube tip is above the carina. There is a right IJ catheter with tip in the right atrium. Cardiac enlargement. Unchanged appearance of bilateral interstitial and airspace opacities are identified. IMPRESSION: 1. Unchanged appearance of bilateral interstitial and  airspace opacities. Electronically Signed   By: Signa Kell M.D.   On: 01/09/2020 07:51     Medications:       Assessment/ Plan:  54 y.o. male a PMHx of recent COVID-19 infection with resultant respiratory failure status post tracheostomy placement, hypertension, hyperlipidemia, tobacco abuse, ARDS, PEG tube placement who was admitted to [facility] on 2019-12-15 for ongoing treatment of acute respiratory failure.  1.  Acute kidney injury suspect most likely underlying ATN.  Patient still with azotemia though improved.  BUN currently 155 with a creatinine of 2.5. -Continue dialysis Monday to Friday for now.  2.  Hyperkalemia.  Potassium currently 4.1 and acceptable.  R.  3.  Acute respiratory failure.  Patient still maintained on the ventilator.  PCO2 currently 66.  Vent adjustments per pulmonary critical care.   LOS: 0 Dorie Ohms 8/23/202112:40 PM

## 2020-01-09 NOTE — Progress Notes (Signed)
Pulmonary Critical Care Medicine [facility] [place]   PULMONARY CRITICAL CARE SERVICE  PROGRESS NOTE  Date of Service: 01/09/2020  Mahki Spikes  GPQ:[id number]  DOB: Mar 24, 1966   DOA: 11/21/2019  Referring Physician: Carron Curie, MD  HPI: Brady Schiller is a 54 y.o. male seen for follow up of Acute on Chronic Respiratory Failure.  Patient continues to do poorly remains on the ventilator on full support and pressure control mode.  The patient has significant acidosis with the latest ABG showing pH 7.2 PCO2 66 PO2 of 68 on maximal vent support with a rate of 35.  BUN is 137 and creatinine is 2.6  Medications: Reviewed on Rounds  Physical Exam:  Vitals: Temperature is 96.0 pulse 85 respiratory rate was 36 blood pressure is 106/50 saturations 92%  Ventilator Settings on the ventilator pressure control FiO2 45% respiratory rate set at 35 PEEP 8 tidal volume 431  . General: Comfortable at this time . Eyes: Grossly normal lids, irises & conjunctiva . ENT: grossly tongue is normal . Neck: no obvious mass . Cardiovascular: S1 S2 normal no gallop . Respiratory: Coarse rhonchi noted bilaterally . Abdomen: soft . Skin: no rash seen on limited exam . Musculoskeletal: not rigid . Psychiatric:unable to assess . Neurologic: no seizure no involuntary movements         Lab Data:   Basic Metabolic Panel: Recent Labs  Lab 01/04/20 1238 01/05/20 0739 01/06/20 0635 01/09/20 0825  NA 138 136 135  136 128*  K 4.5 4.5 4.6  4.6 4.2  CL 98 97* 96*  96* 92*  CO2 32 32 28  29 23   GLUCOSE 152* 150* 164*  163* 157*  BUN 126* 92* 120*  119* 157*  CREATININE 1.77* 1.59* 2.03*  2.01* 2.60*  CALCIUM 8.3* 8.0* 8.4*  8.4* 8.4*  PHOS  --   --  5.7*  --     ABG: Recent Labs  Lab 01/07/20 1235 01/08/20 0520 01/08/20 0700 01/09/20 0437  PHART 7.264* 7.144* 7.191* 7.206*  PCO2ART 64.9* 78.1* 70.1* 66.1*  PO2ART 78.2* 153* 76.1* 68.2*  HCO3 28.4* 26.0 26.2 25.2   O2SAT 97.0 99.4 96.5 94.5    Liver Function Tests: Recent Labs  Lab 01/06/20 0635  ALBUMIN 2.6*   No results for input(s): LIPASE, AMYLASE in the last 168 hours. No results for input(s): AMMONIA in the last 168 hours.  CBC: Recent Labs  Lab 01/04/20 1238 01/05/20 0739 01/06/20 0635 01/09/20 0825  WBC 16.0* 13.7* 19.2* 15.4*  HGB 6.6* 6.9* 8.3* 7.5*  HCT 22.2* 23.2* 28.4* 25.0*  MCV 113.3* 108.9* 107.2* 103.7*  PLT 76* 71* 86* 76*    Cardiac Enzymes: No results for input(s): CKTOTAL, CKMB, CKMBINDEX, TROPONINI in the last 168 hours.  BNP (last 3 results) No results for input(s): BNP in the last 8760 hours.  ProBNP (last 3 results) No results for input(s): PROBNP in the last 8760 hours.  Radiological Exams: DG CHEST PORT 1 VIEW  Result Date: 01/09/2020 CLINICAL DATA:  Respiratory failure. EXAM: PORTABLE CHEST 1 VIEW COMPARISON:  12/29/2019 FINDINGS: The tracheostomy tube tip is above the carina. There is a right IJ catheter with tip in the right atrium. Cardiac enlargement. Unchanged appearance of bilateral interstitial and airspace opacities are identified. IMPRESSION: 1. Unchanged appearance of bilateral interstitial and airspace opacities. Electronically Signed   By: 02/28/2020 M.D.   On: 01/09/2020 07:51    Assessment/Plan Active Problems:   Acute on chronic respiratory failure with hypoxia (  HCC)   COVID-19 virus infection   Acute respiratory distress syndrome (ARDS) due to COVID-19 virus (HCC)   Pneumonia due to COVID-19 virus   Acute renal failure due to tubular necrosis (HCC)   1. Acute on chronic respiratory failure with hypoxia patient remains critically ill not really responding well to the adjustment on the vent although from yesterday the pH has improved from 7.14-7.20.  patient is planned to go on dialysis will discuss with nephrology.  Overall prognosis is quite poor. 2. COVID-19 virus infection overall has resolved x-ray looks unchanged 3. ARDS  no major improvement is noted has bilateral airspace opacities 4. Pneumonia due to COVID-19 has been treated 5. ATN being followed by nephrology prognosis guarded   I have personally seen and evaluated the patient, evaluated laboratory and imaging results, formulated the assessment and plan and placed orders. The Patient requires high complexity decision making with multiple systems involvement.  Rounds were done with the Respiratory Therapy Director and Staff therapists and discussed with nursing staff also.  Time 35 minutes critical care  Yevonne Pax, MD [facility] Pulmonary Critical Care Medicine Sleep Medicine

## 2020-01-10 DIAGNOSIS — N170 Acute kidney failure with tubular necrosis: Secondary | ICD-10-CM | POA: Diagnosis not present

## 2020-01-10 DIAGNOSIS — J80 Acute respiratory distress syndrome: Secondary | ICD-10-CM | POA: Diagnosis not present

## 2020-01-10 DIAGNOSIS — J9621 Acute and chronic respiratory failure with hypoxia: Secondary | ICD-10-CM | POA: Diagnosis not present

## 2020-01-10 DIAGNOSIS — U071 COVID-19: Secondary | ICD-10-CM | POA: Diagnosis not present

## 2020-01-10 LAB — BASIC METABOLIC PANEL
Anion gap: 9 (ref 5–15)
BUN: 115 mg/dL — ABNORMAL HIGH (ref 6–20)
CO2: 28 mmol/L (ref 22–32)
Calcium: 8.2 mg/dL — ABNORMAL LOW (ref 8.9–10.3)
Chloride: 94 mmol/L — ABNORMAL LOW (ref 98–111)
Creatinine, Ser: 1.99 mg/dL — ABNORMAL HIGH (ref 0.61–1.24)
GFR calc Af Amer: 42 mL/min — ABNORMAL LOW (ref >60)
GFR calc non Af Amer: 36 mL/min — ABNORMAL LOW (ref >60)
Glucose, Bld: 173 mg/dL — ABNORMAL HIGH (ref 70–99)
Potassium: 3.5 mmol/L (ref 3.5–5.1)
Sodium: 131 mmol/L — ABNORMAL LOW (ref 135–145)

## 2020-01-10 LAB — CBC
HCT: 23.6 % — ABNORMAL LOW (ref 39.0–52.0)
Hemoglobin: 7 g/dL — ABNORMAL LOW (ref 13.0–17.0)
MCH: 31.3 pg (ref 26.0–34.0)
MCHC: 29.7 g/dL — ABNORMAL LOW (ref 30.0–36.0)
MCV: 105.4 fL — ABNORMAL HIGH (ref 80.0–100.0)
Platelets: 68 10*3/uL — ABNORMAL LOW (ref 150–400)
RBC: 2.24 MIL/uL — ABNORMAL LOW (ref 4.22–5.81)
RDW: 24.6 % — ABNORMAL HIGH (ref 11.5–15.5)
WBC: 13.1 10*3/uL — ABNORMAL HIGH (ref 4.0–10.5)
nRBC: 0.8 % — ABNORMAL HIGH (ref 0.0–0.2)

## 2020-01-10 LAB — MAGNESIUM: Magnesium: 2 mg/dL (ref 1.7–2.4)

## 2020-01-10 NOTE — Progress Notes (Addendum)
Pulmonary Critical Care Medicine [facility] [place]   PULMONARY CRITICAL CARE SERVICE  PROGRESS NOTE  Date of Service: 01/10/2020  Danny Leach  RSW:[id number]  DOB: 19-Jan-1966   DOA: 12/10/2019  Referring Physician: Carron Curie, MD  HPI: Danny Leach is a 54 y.o. male seen for follow up of Acute on Chronic Respiratory Failure.  Patient remains on full support pressure control mode at this time rate of 35 and FiO2 of 60% currently satting well with no distress.  Medications: Reviewed on Rounds  Physical Exam:  Vitals: Pulse 100 respirations 31 BP 121/53 O2 sat 96% temp 96.0  Ventilator Settings ventilator mode AC PC rate of 35 respiratory pressure of 32 PEEP of 8 and FiO2 of 60%  . General: Comfortable at this time . Eyes: Grossly normal lids, irises & conjunctiva . ENT: grossly tongue is normal . Neck: no obvious mass . Cardiovascular: S1 S2 normal no gallop . Respiratory: Coarse breath sounds . Abdomen: soft . Skin: no rash seen on limited exam . Musculoskeletal: not rigid . Psychiatric:unable to assess . Neurologic: no seizure no involuntary movements         Lab Data:   Basic Metabolic Panel: Recent Labs  Lab 01/04/20 1238 01/05/20 0739 01/06/20 0635 01/09/20 0825 01/10/20 1533  NA 138 136 135  136 127*  128* 131*  K 4.5 4.5 4.6  4.6 4.1  4.2 3.5  CL 98 97* 96*  96* 91*  92* 94*  CO2 32 32 28  29 24  23 28   GLUCOSE 152* 150* 164*  163* 156*  157* 173*  BUN 126* 92* 120*  119* 155*  157* 115*  CREATININE 1.77* 1.59* 2.03*  2.01* 2.53*  2.60* 1.99*  CALCIUM 8.3* 8.0* 8.4*  8.4* 8.4*  8.4* 8.2*  MG  --   --   --   --  2.0  PHOS  --   --  5.7* 3.5  --     ABG: Recent Labs  Lab 01/07/20 1235 01/08/20 0520 01/08/20 0700 01/09/20 0437  PHART 7.264* 7.144* 7.191* 7.206*  PCO2ART 64.9* 78.1* 70.1* 66.1*  PO2ART 78.2* 153* 76.1* 68.2*  HCO3 28.4* 26.0 26.2 25.2  O2SAT 97.0 99.4 96.5 94.5    Liver Function  Tests: Recent Labs  Lab 01/06/20 0635 01/09/20 0825  ALBUMIN 2.6* 2.0*   No results for input(s): LIPASE, AMYLASE in the last 168 hours. No results for input(s): AMMONIA in the last 168 hours.  CBC: Recent Labs  Lab 01/04/20 1238 01/05/20 0739 01/06/20 0635 01/09/20 0825 01/10/20 1533  WBC 16.0* 13.7* 19.2* 15.4* 13.1*  HGB 6.6* 6.9* 8.3* 7.5* 7.0*  HCT 22.2* 23.2* 28.4* 25.0* 23.6*  MCV 113.3* 108.9* 107.2* 103.7* 105.4*  PLT 76* 71* 86* 76* 68*    Cardiac Enzymes: No results for input(s): CKTOTAL, CKMB, CKMBINDEX, TROPONINI in the last 168 hours.  BNP (last 3 results) No results for input(s): BNP in the last 8760 hours.  ProBNP (last 3 results) No results for input(s): PROBNP in the last 8760 hours.  Radiological Exams: DG CHEST PORT 1 VIEW  Result Date: 01/09/2020 CLINICAL DATA:  Respiratory failure. EXAM: PORTABLE CHEST 1 VIEW COMPARISON:  12/29/2019 FINDINGS: The tracheostomy tube tip is above the carina. There is a right IJ catheter with tip in the right atrium. Cardiac enlargement. Unchanged appearance of bilateral interstitial and airspace opacities are identified. IMPRESSION: 1. Unchanged appearance of bilateral interstitial and airspace opacities. Electronically Signed   By: 02/28/2020  M.D.   On: 01/09/2020 07:51    Assessment/Plan Active Problems:   Acute on chronic respiratory failure with hypoxia (HCC)   COVID-19 virus infection   Acute respiratory distress syndrome (ARDS) due to COVID-19 virus (HCC)   Pneumonia due to COVID-19 virus   Acute renal failure due to tubular necrosis (HCC)   1. Acute on chronic respiratory failure with hypoxia patient remains on full support on the vent at this time currently requiring PEEP of 8 and FiO2 60% prognosis remains poor we will continue aggressive pulmonary toilet supportive measures. 2. COVID-19 virus infection overall has resolved x-ray looks unchanged 3. ARDS no major improvement is noted has bilateral  airspace opacities 4. Pneumonia due to COVID-19 has been treated 5. ATN being followed by nephrology prognosis guarded   I have personally seen and evaluated the patient, evaluated laboratory and imaging results, formulated the assessment and plan and placed orders. The Patient requires high complexity decision making with multiple systems involvement.  Rounds were done with the Respiratory Therapy Director and Staff therapists and discussed with nursing staff also.  Yevonne Pax, MD [facility] Pulmonary Critical Care Medicine Sleep Medicine

## 2020-01-11 DIAGNOSIS — N170 Acute kidney failure with tubular necrosis: Secondary | ICD-10-CM | POA: Diagnosis not present

## 2020-01-11 DIAGNOSIS — U071 COVID-19: Secondary | ICD-10-CM | POA: Diagnosis not present

## 2020-01-11 DIAGNOSIS — J80 Acute respiratory distress syndrome: Secondary | ICD-10-CM | POA: Diagnosis not present

## 2020-01-11 DIAGNOSIS — J9621 Acute and chronic respiratory failure with hypoxia: Secondary | ICD-10-CM | POA: Diagnosis not present

## 2020-01-11 LAB — RENAL FUNCTION PANEL
Albumin: 1.7 g/dL — ABNORMAL LOW (ref 3.5–5.0)
Anion gap: 10 (ref 5–15)
BUN: 84 mg/dL — ABNORMAL HIGH (ref 6–20)
CO2: 27 mmol/L (ref 22–32)
Calcium: 8 mg/dL — ABNORMAL LOW (ref 8.9–10.3)
Chloride: 95 mmol/L — ABNORMAL LOW (ref 98–111)
Creatinine, Ser: 1.46 mg/dL — ABNORMAL HIGH (ref 0.61–1.24)
GFR calc Af Amer: >60 mL/min (ref >60)
GFR calc non Af Amer: 53 mL/min — ABNORMAL LOW (ref >60)
Glucose, Bld: 163 mg/dL — ABNORMAL HIGH (ref 70–99)
Phosphorus: 1.2 mg/dL — ABNORMAL LOW (ref 2.5–4.6)
Potassium: 3.5 mmol/L (ref 3.5–5.1)
Sodium: 132 mmol/L — ABNORMAL LOW (ref 135–145)

## 2020-01-11 LAB — CBC
HCT: 24.2 % — ABNORMAL LOW (ref 39.0–52.0)
Hemoglobin: 7.3 g/dL — ABNORMAL LOW (ref 13.0–17.0)
MCH: 31.3 pg (ref 26.0–34.0)
MCHC: 30.2 g/dL (ref 30.0–36.0)
MCV: 103.9 fL — ABNORMAL HIGH (ref 80.0–100.0)
Platelets: 67 10*3/uL — ABNORMAL LOW (ref 150–400)
RBC: 2.33 MIL/uL — ABNORMAL LOW (ref 4.22–5.81)
RDW: 24.7 % — ABNORMAL HIGH (ref 11.5–15.5)
WBC: 11.7 10*3/uL — ABNORMAL HIGH (ref 4.0–10.5)
nRBC: 1.5 % — ABNORMAL HIGH (ref 0.0–0.2)

## 2020-01-11 LAB — MAGNESIUM: Magnesium: 1.8 mg/dL (ref 1.7–2.4)

## 2020-01-11 NOTE — Progress Notes (Signed)
[facility]  ROUNDING NOTE   Subjective:  Patient seen and evaluated today. Still on ventilatory support at this time. Azotemia significantly improved his BUN down to 84 with a creatinine of 1.46.   Objective:  Vital signs in last 24 hours:  Temperature 97.2 pulse 87 respirations 32 blood pressure 101/58  Physical Exam: General: Critically ill-appearing  Head: Normocephalic, atraumatic. Moist oral mucosal membranes  Eyes: Anicteric  Neck: Tracheostomy in place  Lungs:  Bilateral rhonchi, vent assisted  Heart: S1S2 no rubs  Abdomen:  Soft, nontender, bowel sounds present  Extremities: 3+ generalized edema  Neurologic: Awake, alert, follows simple commands  Skin: No lesions       Basic Metabolic Panel: Recent Labs  Lab 01/05/20 0739 01/05/20 0739 01/06/20 0635 01/06/20 0635 01/09/20 0825 01/10/20 1533 01/11/20 0842  NA 136  --  135  136  --  127*  128* 131* 132*  K 4.5  --  4.6  4.6  --  4.1  4.2 3.5 3.5  CL 97*  --  96*  96*  --  91*  92* 94* 95*  CO2 32  --  28  29  --  24  23 28 27   GLUCOSE 150*  --  164*  163*  --  156*  157* 173* 163*  BUN 92*  --  120*  119*  --  155*  157* 115* 84*  CREATININE 1.59*  --  2.03*  2.01*  --  2.53*  2.60* 1.99* 1.46*  CALCIUM 8.0*   < > 8.4*  8.4*   < > 8.4*  8.4* 8.2* 8.0*  MG  --   --   --   --   --  2.0 1.8  PHOS  --   --  5.7*  --  3.5  --  1.2*   < > = values in this interval not displayed.    Liver Function Tests: Recent Labs  Lab 01/06/20 0635 01/09/20 0825 01/11/20 0842  ALBUMIN 2.6* 2.0* 1.7*   No results for input(s): LIPASE, AMYLASE in the last 168 hours. No results for input(s): AMMONIA in the last 168 hours.  CBC: Recent Labs  Lab 01/05/20 0739 01/06/20 0635 01/09/20 0825 01/10/20 1533 01/11/20 0842  WBC 13.7* 19.2* 15.4* 13.1* 11.7*  HGB 6.9* 8.3* 7.5* 7.0* 7.3*  HCT 23.2* 28.4* 25.0* 23.6* 24.2*  MCV 108.9* 107.2* 103.7* 105.4* 103.9*  PLT 71* 86* 76* 68* 67*     Cardiac Enzymes: No results for input(s): CKTOTAL, CKMB, CKMBINDEX, TROPONINI in the last 168 hours.  BNP: Invalid input(s): POCBNP  CBG: No results for input(s): GLUCAP in the last 168 hours.  Microbiology: Results for orders placed or performed during the hospital encounter of December 16, 2019  Culture, respiratory     Status: None   Collection Time: 11/29/19  9:21 AM   Specimen: Tracheal Aspirate; Respiratory  Result Value Ref Range Status   Specimen Description TRACHEAL ASPIRATE  Final   Special Requests NONE  Final   Gram Stain   Final    FEW WBC PRESENT, PREDOMINANTLY PMN ABUNDANT GRAM POSITIVE COCCI ABUNDANT GRAM POSITIVE RODS MODERATE GRAM NEGATIVE RODS    Culture   Final    RARE Consistent with normal respiratory flora. Performed at [facility], [address] [place], [place]    Report Status 12/01/2019 FINAL  Final  Culture, respiratory (non-expectorated)     Status: None   Collection Time: 12/03/19  1:45 PM   Specimen:  Tracheal Aspirate; Respiratory  Result Value Ref Range Status   Specimen Description TRACHEAL ASPIRATE  Final   Special Requests NONE  Final   Gram Stain   Final    RARE WBC PRESENT,BOTH PMN AND MONONUCLEAR RARE GRAM POSITIVE COCCI RARE YEAST RARE GRAM POSITIVE RODS Performed at [facility], [address] [place], [place] [address]    Culture FEW CANDIDA TROPICALIS  Final   Report Status 12/06/2019 FINAL  Final  Culture, respiratory (non-expectorated)     Status: None   Collection Time: 12/04/19  1:53 PM   Specimen: Tracheal Aspirate; Respiratory  Result Value Ref Range Status   Specimen Description TRACHEAL ASPIRATE  Final   Special Requests NONE  Final   Gram Stain   Final    RARE WBC PRESENT, PREDOMINANTLY PMN FEW GRAM POSITIVE COCCI IN PAIRS IN CLUSTERS RARE GRAM NEGATIVE RODS    Culture   Final    Consistent with normal respiratory flora. Performed at [facility], [address], [place],  [place] [address]    Report Status 12/06/2019 FINAL  Final  Culture, blood (routine x 2)     Status: None   Collection Time: 12/04/19  2:26 PM   Specimen: BLOOD LEFT HAND  Result Value Ref Range Status   Specimen Description BLOOD LEFT HAND  Final   Special Requests   Final    BOTTLES DRAWN AEROBIC ONLY Blood Culture results may not be optimal due to an inadequate volume of blood received in culture bottles   Culture   Final    NO GROWTH 5 DAYS Performed at [facility], [address], [place], [place] [address]    Report Status 12/09/2019 FINAL  Final  Culture, blood (routine x 2)     Status: None   Collection Time: 12/04/19  2:26 PM   Specimen: BLOOD LEFT HAND  Result Value Ref Range Status   Specimen Description BLOOD LEFT HAND  Final   Special Requests   Final    BOTTLES DRAWN AEROBIC ONLY Blood Culture results may not be optimal due to an inadequate volume of blood received in culture bottles   Culture   Final    NO GROWTH 5 DAYS Performed at [facility], [address] [place], [place] [address]    Report Status 12/09/2019 FINAL  Final  Culture, respiratory     Status: None   Collection Time: 12/13/19  9:20 AM   Specimen: Tracheal Aspirate; Respiratory  Result Value Ref Range Status   Specimen Description TRACHEAL ASPIRATE  Final   Special Requests NONE  Final   Gram Stain RARE WBC PRESENT, PREDOMINANTLY PMN RARE YEAST   Final   Culture   Final    RARE Consistent with normal respiratory flora. Performed at [facility], [address], [place], [place] [address]    Report Status 12/15/2019 FINAL  Final  Culture, blood (routine x 2)     Status: None   Collection Time: 12/17/19  4:46 PM   Specimen: BLOOD LEFT HAND  Result Value Ref Range Status   Specimen Description BLOOD LEFT HAND  Final   Special Requests   Final    BOTTLES DRAWN AEROBIC AND ANAEROBIC Blood Culture adequate volume   Culture   Final    NO GROWTH 5 DAYS Performed at [facility], [address], [place], [place] [address]    Report Status 12/22/2019 FINAL  Final  Culture, blood (  routine x 2)     Status: None   Collection Time: 12/17/19  5:34 PM   Specimen: BLOOD LEFT HAND  Result Value Ref Range Status   Specimen Description BLOOD LEFT HAND  Final   Special Requests   Final    BOTTLES DRAWN AEROBIC AND ANAEROBIC Blood Culture adequate volume   Culture   Final    NO GROWTH 5 DAYS Performed at [facility], [address] [place], [place] [address]    Report Status 12/22/2019 FINAL  Final  Culture, respiratory     Status: None   Collection Time: 12/17/19  5:40 PM   Specimen: Tracheal Aspirate; Respiratory  Result Value Ref Range Status   Specimen Description TRACHEAL ASPIRATE  Final   Special Requests NONE  Final   Gram Stain   Final    FEW WBC PRESENT, PREDOMINANTLY PMN FEW YEAST Performed at [facility], [address] [place], [place] [address]    Culture FEW CANDIDA TROPICALIS  Final   Report Status 12/19/2019 FINAL  Final  Culture, respiratory (non-expectorated)     Status: None   Collection Time: 12/24/19 10:25 AM   Specimen: Tracheal Aspirate; Respiratory  Result Value Ref Range Status   Specimen Description TRACHEAL ASPIRATE  Final   Special Requests NONE  Final   Gram Stain   Final    NO WBC SEEN RARE BUDDING YEAST SEEN Performed at [facility], [address] [place], [place] [address]    Culture RARE CANDIDA TROPICALIS  Final   Report Status 12/26/2019 FINAL  Final    Coagulation Studies: No results for input(s): LABPROT, INR in the last 72 hours.  Urinalysis: No results for input(s): COLORURINE, LABSPEC, PHURINE, GLUCOSEU, HGBUR, BILIRUBINUR, KETONESUR, PROTEINUR, UROBILINOGEN, NITRITE, LEUKOCYTESUR in the last 72 hours.  Invalid input(s): APPERANCEUR    Imaging: No results found.   Medications:       Assessment/ Plan:  54 y.o. male a PMHx of recent COVID-19 infection with resultant  respiratory failure status post tracheostomy placement, hypertension, hyperlipidemia, tobacco abuse, ARDS, PEG tube placement who was admitted to [facility] on 12-16-19 for ongoing treatment of acute respiratory failure.  1.  Acute kidney injury suspect most likely underlying ATN.  Patient with very significant azotemia at the initiation of dialysis treatments. -Azotemia significantly improved.  BUN down to 84 with a creatinine of 1.46.  Maintain the patient on dialysis Monday through Friday for now.  2.  Hyperkalemia.  Potassium currently 3.5 and acceptable.  3.  Acute respiratory failure.  Continue ventilatory support as per pulmonary/critical care.  Hopefully FiO2 can be weaned down as we continue to remove fluid.  4.  Hyponatremia.  Serum sodium now up to 132.  This should hopefully stabilize as we continue to perform dialysis.   LOS: 0 Josephine Rudnick 8/25/202111:34 AM

## 2020-01-11 NOTE — Progress Notes (Signed)
Pulmonary Critical Care Medicine [facility] [place]   PULMONARY CRITICAL CARE SERVICE  PROGRESS NOTE  Date of Service: 01/11/2020  Lathen Seal  PYK:[id number]  DOB: 21-Aug-1965   DOA: 12/07/2019  Referring Physician: Carron Curie, MD  HPI: Danyal Whitenack is a 54 y.o. male seen for follow up of Acute on Chronic Respiratory Failure.  Patient currently is on full support on pressure assist control has been on 60% FiO2 with a PEEP of 8  Medications: Reviewed on Rounds  Physical Exam:  Vitals: Temperature is 97.2 pulse 87 respiratory rate 32 blood pressure is 101/58 saturations 93%  Ventilator Settings on pressure assist control FiO2 of 60% PEEP 8 IP 32  . General: Comfortable at this time . Eyes: Grossly normal lids, irises & conjunctiva . ENT: grossly tongue is normal . Neck: no obvious mass . Cardiovascular: S1 S2 normal no gallop . Respiratory: Scattered rhonchi expansion is equal . Abdomen: soft . Skin: no rash seen on limited exam . Musculoskeletal: not rigid . Psychiatric:unable to assess . Neurologic: no seizure no involuntary movements         Lab Data:   Basic Metabolic Panel: Recent Labs  Lab 01/05/20 0739 01/06/20 0635 01/09/20 0825 01/10/20 1533 01/11/20 0842  NA 136 135  136 127*  128* 131* 132*  K 4.5 4.6  4.6 4.1  4.2 3.5 3.5  CL 97* 96*  96* 91*  92* 94* 95*  CO2 32 28  29 24  23 28 27   GLUCOSE 150* 164*  163* 156*  157* 173* 163*  BUN 92* 120*  119* 155*  157* 115* 84*  CREATININE 1.59* 2.03*  2.01* 2.53*  2.60* 1.99* 1.46*  CALCIUM 8.0* 8.4*  8.4* 8.4*  8.4* 8.2* 8.0*  MG  --   --   --  2.0 1.8  PHOS  --  5.7* 3.5  --  1.2*    ABG: Recent Labs  Lab 01/07/20 1235 01/08/20 0520 01/08/20 0700 01/09/20 0437  PHART 7.264* 7.144* 7.191* 7.206*  PCO2ART 64.9* 78.1* 70.1* 66.1*  PO2ART 78.2* 153* 76.1* 68.2*  HCO3 28.4* 26.0 26.2 25.2  O2SAT 97.0 99.4 96.5 94.5    Liver Function Tests: Recent Labs  Lab  01/06/20 0635 01/09/20 0825 01/11/20 0842  ALBUMIN 2.6* 2.0* 1.7*   No results for input(s): LIPASE, AMYLASE in the last 168 hours. No results for input(s): AMMONIA in the last 168 hours.  CBC: Recent Labs  Lab 01/05/20 0739 01/06/20 0635 01/09/20 0825 01/10/20 1533 01/11/20 0842  WBC 13.7* 19.2* 15.4* 13.1* 11.7*  HGB 6.9* 8.3* 7.5* 7.0* 7.3*  HCT 23.2* 28.4* 25.0* 23.6* 24.2*  MCV 108.9* 107.2* 103.7* 105.4* 103.9*  PLT 71* 86* 76* 68* 67*    Cardiac Enzymes: No results for input(s): CKTOTAL, CKMB, CKMBINDEX, TROPONINI in the last 168 hours.  BNP (last 3 results) No results for input(s): BNP in the last 8760 hours.  ProBNP (last 3 results) No results for input(s): PROBNP in the last 8760 hours.  Radiological Exams: No results found.  Assessment/Plan Active Problems:   Acute on chronic respiratory failure with hypoxia (HCC)   COVID-19 virus infection   Acute respiratory distress syndrome (ARDS) due to COVID-19 virus (HCC)   Pneumonia due to COVID-19 virus   Acute renal failure due to tubular necrosis (HCC)   1. Acute on chronic respiratory failure with hypoxia plan is to continue with pressure control mode patient is not able to do any weaning.  Right now  is on 60% FiO2 with a PEEP of 8 2. COVID-19 virus infection in resolution we will continue to monitor 3. ARDS treated secondary to COVID-19 no major improvement noted. 4. Pneumonia due to COVID-19 has been treated resolution 5. Acute renal failure patient being followed by nephrology.  Noted to have improvement in BUN/creatinine   I have personally seen and evaluated the patient, evaluated laboratory and imaging results, formulated the assessment and plan and placed orders. The Patient requires high complexity decision making with multiple systems involvement.  Rounds were done with the Respiratory Therapy Director and Staff therapists and discussed with nursing staff also.  Yevonne Pax, MD [facility] Pulmonary  Critical Care Medicine Sleep Medicine

## 2020-01-12 DIAGNOSIS — N170 Acute kidney failure with tubular necrosis: Secondary | ICD-10-CM | POA: Diagnosis not present

## 2020-01-12 DIAGNOSIS — J9621 Acute and chronic respiratory failure with hypoxia: Secondary | ICD-10-CM | POA: Diagnosis not present

## 2020-01-12 DIAGNOSIS — U071 COVID-19: Secondary | ICD-10-CM | POA: Diagnosis not present

## 2020-01-12 DIAGNOSIS — J80 Acute respiratory distress syndrome: Secondary | ICD-10-CM | POA: Diagnosis not present

## 2020-01-12 NOTE — Progress Notes (Signed)
Pulmonary Critical Care Medicine [facility] [place]   PULMONARY CRITICAL CARE SERVICE  PROGRESS NOTE  Date of Service: 01/12/2020  Kell Ferris  JTT:[id number]  DOB: 03-19-1966   DOA: 12-14-19  Referring Physician: Carron Curie, MD  HPI: Danny Leach is a 54 y.o. male seen for follow up of Acute on Chronic Respiratory Failure.  Patient is on pressure control mode currently on 60% FiO2 with an IP of 32 PEEP is at 8  Medications: Reviewed on Rounds  Physical Exam:  Vitals: Temperature is 96.3 pulse 98 respiratory rate 32 blood pressure is 137/69 saturations 98%  Ventilator Settings on pressure assist control FiO2 60% IP 32 PEEP 8  . General: Comfortable at this time . Eyes: Grossly normal lids, irises & conjunctiva . ENT: grossly tongue is normal . Neck: no obvious mass . Cardiovascular: S1 S2 normal no gallop . Respiratory: No rhonchi very coarse breath sounds . Abdomen: soft . Skin: no rash seen on limited exam . Musculoskeletal: not rigid . Psychiatric:unable to assess . Neurologic: no seizure no involuntary movements         Lab Data:   Basic Metabolic Panel: Recent Labs  Lab 01/06/20 0635 01/09/20 0825 01/10/20 1533 01/11/20 0842  NA 135  136 127*  128* 131* 132*  K 4.6  4.6 4.1  4.2 3.5 3.5  CL 96*  96* 91*  92* 94* 95*  CO2 28  29 24  23 28 27   GLUCOSE 164*  163* 156*  157* 173* 163*  BUN 120*  119* 155*  157* 115* 84*  CREATININE 2.03*  2.01* 2.53*  2.60* 1.99* 1.46*  CALCIUM 8.4*  8.4* 8.4*  8.4* 8.2* 8.0*  MG  --   --  2.0 1.8  PHOS 5.7* 3.5  --  1.2*    ABG: Recent Labs  Lab 01/07/20 1235 01/08/20 0520 01/08/20 0700 01/09/20 0437  PHART 7.264* 7.144* 7.191* 7.206*  PCO2ART 64.9* 78.1* 70.1* 66.1*  PO2ART 78.2* 153* 76.1* 68.2*  HCO3 28.4* 26.0 26.2 25.2  O2SAT 97.0 99.4 96.5 94.5    Liver Function Tests: Recent Labs  Lab 01/06/20 0635 01/09/20 0825 01/11/20 0842  ALBUMIN 2.6* 2.0* 1.7*   No  results for input(s): LIPASE, AMYLASE in the last 168 hours. No results for input(s): AMMONIA in the last 168 hours.  CBC: Recent Labs  Lab 01/06/20 0635 01/09/20 0825 01/10/20 1533 01/11/20 0842  WBC 19.2* 15.4* 13.1* 11.7*  HGB 8.3* 7.5* 7.0* 7.3*  HCT 28.4* 25.0* 23.6* 24.2*  MCV 107.2* 103.7* 105.4* 103.9*  PLT 86* 76* 68* 67*    Cardiac Enzymes: No results for input(s): CKTOTAL, CKMB, CKMBINDEX, TROPONINI in the last 168 hours.  BNP (last 3 results) No results for input(s): BNP in the last 8760 hours.  ProBNP (last 3 results) No results for input(s): PROBNP in the last 8760 hours.  Radiological Exams: No results found.  Assessment/Plan Active Problems:   Acute on chronic respiratory failure with hypoxia (HCC)   COVID-19 virus infection   Acute respiratory distress syndrome (ARDS) due to COVID-19 virus (HCC)   Pneumonia due to COVID-19 virus   Acute renal failure due to tubular necrosis (HCC)   1. Acute on chronic respiratory failure hypoxia we will continue with full support on the ventilator.  Patient is not tolerating any attempts at weaning. 2. COVID-19 virus infection in resolution has been treated 3. ARDS slow improvement still limiting factor with residual damage 4. Pneumonia due to COVID-19 treated 5.  Acute renal failure tubular necrosis being seen by nephrology last set of labs look like there was some improvement   I have personally seen and evaluated the patient, evaluated laboratory and imaging results, formulated the assessment and plan and placed orders. The Patient requires high complexity decision making with multiple systems involvement.  Rounds were done with the Respiratory Therapy Director and Staff therapists and discussed with nursing staff also.  Yevonne Pax, MD [facility] Pulmonary Critical Care Medicine Sleep Medicine

## 2020-01-13 DIAGNOSIS — N170 Acute kidney failure with tubular necrosis: Secondary | ICD-10-CM | POA: Diagnosis not present

## 2020-01-13 DIAGNOSIS — J9621 Acute and chronic respiratory failure with hypoxia: Secondary | ICD-10-CM | POA: Diagnosis not present

## 2020-01-13 DIAGNOSIS — U071 COVID-19: Secondary | ICD-10-CM | POA: Diagnosis not present

## 2020-01-13 DIAGNOSIS — J80 Acute respiratory distress syndrome: Secondary | ICD-10-CM | POA: Diagnosis not present

## 2020-01-13 LAB — CBC
HCT: 22 % — ABNORMAL LOW (ref 39.0–52.0)
Hemoglobin: 6.5 g/dL — CL (ref 13.0–17.0)
MCH: 31.7 pg (ref 26.0–34.0)
MCHC: 29.5 g/dL — ABNORMAL LOW (ref 30.0–36.0)
MCV: 107.3 fL — ABNORMAL HIGH (ref 80.0–100.0)
Platelets: 48 10*3/uL — ABNORMAL LOW (ref 150–400)
RBC: 2.05 MIL/uL — ABNORMAL LOW (ref 4.22–5.81)
RDW: 24.4 % — ABNORMAL HIGH (ref 11.5–15.5)
WBC: 8.2 10*3/uL (ref 4.0–10.5)
nRBC: 2.6 % — ABNORMAL HIGH (ref 0.0–0.2)

## 2020-01-13 LAB — RENAL FUNCTION PANEL
Albumin: 1.6 g/dL — ABNORMAL LOW (ref 3.5–5.0)
Anion gap: 9 (ref 5–15)
BUN: 63 mg/dL — ABNORMAL HIGH (ref 6–20)
CO2: 27 mmol/L (ref 22–32)
Calcium: 7.8 mg/dL — ABNORMAL LOW (ref 8.9–10.3)
Chloride: 98 mmol/L (ref 98–111)
Creatinine, Ser: 1.11 mg/dL (ref 0.61–1.24)
GFR calc Af Amer: >60 mL/min (ref >60)
GFR calc non Af Amer: >60 mL/min (ref >60)
Glucose, Bld: 170 mg/dL — ABNORMAL HIGH (ref 70–99)
Phosphorus: 1.9 mg/dL — ABNORMAL LOW (ref 2.5–4.6)
Potassium: 4.3 mmol/L (ref 3.5–5.1)
Sodium: 134 mmol/L — ABNORMAL LOW (ref 135–145)

## 2020-01-13 LAB — PREPARE RBC (CROSSMATCH)

## 2020-01-13 NOTE — Progress Notes (Addendum)
Pulmonary Critical Care Medicine [facility] [place]   PULMONARY CRITICAL CARE SERVICE  PROGRESS NOTE  Date of Service: 01/13/2020  Jude Naclerio  FFM:[id number]  DOB: 1966-02-16   DOA: 12/16/2019  Referring Physician: Carron Curie, MD  HPI: Taijon Vink is a 54 y.o. male seen for follow up of Acute on Chronic Respiratory Failure.  Patient remains on full support on the vent currently on a rate of 35 and FiO2 60% satting well at this time.  Medications: Reviewed on Rounds  Physical Exam:  Vitals: Pulse 86 respirations 35 BP 109/56 O2 sat 98% temp 97.1  Ventilator Settings Mode AC PC rate of 35 respiratory pressure of 32 PEEP of 8 with an FiO2 of 60%  . General: Comfortable at this time . Eyes: Grossly normal lids, irises & conjunctiva . ENT: grossly tongue is normal . Neck: no obvious mass . Cardiovascular: S1 S2 normal no gallop . Respiratory: No rales or rhonchi noted . Abdomen: soft . Skin: no rash seen on limited exam . Musculoskeletal: not rigid . Psychiatric:unable to assess . Neurologic: no seizure no involuntary movements         Lab Data:   Basic Metabolic Panel: Recent Labs  Lab 01/09/20 0825 01/10/20 1533 01/11/20 0842 01/13/20 0854  NA 127*  128* 131* 132* 134*  K 4.1  4.2 3.5 3.5 4.3  CL 91*  92* 94* 95* 98  CO2 24  23 28 27 27   GLUCOSE 156*  157* 173* 163* 170*  BUN 155*  157* 115* 84* 63*  CREATININE 2.53*  2.60* 1.99* 1.46* 1.11  CALCIUM 8.4*  8.4* 8.2* 8.0* 7.8*  MG  --  2.0 1.8  --   PHOS 3.5  --  1.2* 1.9*    ABG: Recent Labs  Lab 01/07/20 1235 01/08/20 0520 01/08/20 0700 01/09/20 0437  PHART 7.264* 7.144* 7.191* 7.206*  PCO2ART 64.9* 78.1* 70.1* 66.1*  PO2ART 78.2* 153* 76.1* 68.2*  HCO3 28.4* 26.0 26.2 25.2  O2SAT 97.0 99.4 96.5 94.5    Liver Function Tests: Recent Labs  Lab 01/09/20 0825 01/11/20 0842 01/13/20 0854  ALBUMIN 2.0* 1.7* 1.6*   No results for input(s): LIPASE, AMYLASE in the  last 168 hours. No results for input(s): AMMONIA in the last 168 hours.  CBC: Recent Labs  Lab 01/09/20 0825 01/10/20 1533 01/11/20 0842 01/13/20 1049  WBC 15.4* 13.1* 11.7* 8.2  HGB 7.5* 7.0* 7.3* 6.5*  HCT 25.0* 23.6* 24.2* 22.0*  MCV 103.7* 105.4* 103.9* 107.3*  PLT 76* 68* 67* 48*    Cardiac Enzymes: No results for input(s): CKTOTAL, CKMB, CKMBINDEX, TROPONINI in the last 168 hours.  BNP (last 3 results) No results for input(s): BNP in the last 8760 hours.  ProBNP (last 3 results) No results for input(s): PROBNP in the last 8760 hours.  Radiological Exams: No results found.  Assessment/Plan Active Problems:   Acute on chronic respiratory failure with hypoxia (HCC)   COVID-19 virus infection   Acute respiratory distress syndrome (ARDS) due to COVID-19 virus (HCC)   Pneumonia due to COVID-19 virus   Acute renal failure due to tubular necrosis (HCC)   1. Acute on chronic respiratory failure hypoxia patient will remain on full support at this time we will continue to attempt weaning FiO2 as tolerated.  Tinea supportive measures and pulmonary toilet. 2. COVID-19 virus infection in resolution has been treated 3. ARDS slow improvement still limiting factor with residual damage 4. Pneumonia due to COVID-19 treated 5. Acute renal  failure tubular necrosis being seen by nephrology last set of labs look like there was some improvement   I have personally seen and evaluated the patient, evaluated laboratory and imaging results, formulated the assessment and plan and placed orders. The Patient requires high complexity decision making with multiple systems involvement.  Rounds were done with the Respiratory Therapy Director and Staff therapists and discussed with nursing staff also.  Yevonne Pax, MD [facility] Pulmonary Critical Care Medicine Sleep Medicine

## 2020-01-13 NOTE — Progress Notes (Signed)
[facility]  ROUNDING NOTE   Subjective:  Patient seen and evaluated at bedside. Still fluid overloaded. Maintained on the ventilator.   Objective:  Vital signs in last 24 hours:  Temperature 97.1 pulse 86 respirations 35 blood pressure 109/56  Physical Exam: General: Critically ill-appearing  Head: Normocephalic, atraumatic. Moist oral mucosal membranes  Eyes: Anicteric  Neck: Tracheostomy in place  Lungs:  Bilateral rhonchi, vent assisted  Heart: S1S2 no rubs  Abdomen:  Soft, nontender, bowel sounds present  Extremities: 3+ generalized edema, less tight  Neurologic: Arousable  Skin: Upper extremity ecchymoses       Basic Metabolic Panel: Recent Labs  Lab 01/09/20 0825 01/10/20 1533 01/11/20 0842  NA 127*  128* 131* 132*  K 4.1  4.2 3.5 3.5  CL 91*  92* 94* 95*  CO2 24  23 28 27   GLUCOSE 156*  157* 173* 163*  BUN 155*  157* 115* 84*  CREATININE 2.53*  2.60* 1.99* 1.46*  CALCIUM 8.4*  8.4* 8.2* 8.0*  MG  --  2.0 1.8  PHOS 3.5  --  1.2*    Liver Function Tests: Recent Labs  Lab 01/09/20 0825 01/11/20 0842  ALBUMIN 2.0* 1.7*   No results for input(s): LIPASE, AMYLASE in the last 168 hours. No results for input(s): AMMONIA in the last 168 hours.  CBC: Recent Labs  Lab 01/09/20 0825 01/10/20 1533 01/11/20 0842  WBC 15.4* 13.1* 11.7*  HGB 7.5* 7.0* 7.3*  HCT 25.0* 23.6* 24.2*  MCV 103.7* 105.4* 103.9*  PLT 76* 68* 67*    Cardiac Enzymes: No results for input(s): CKTOTAL, CKMB, CKMBINDEX, TROPONINI in the last 168 hours.  BNP: Invalid input(s): POCBNP  CBG: No results for input(s): GLUCAP in the last 168 hours.  Microbiology: Results for orders placed or performed during the hospital encounter of 18-Dec-2019  Culture, respiratory     Status: None   Collection Time: 11/29/19  9:21 AM   Specimen: Tracheal Aspirate; Respiratory  Result Value Ref Range Status   Specimen Description TRACHEAL ASPIRATE  Final   Special Requests  NONE  Final   Gram Stain   Final    FEW WBC PRESENT, PREDOMINANTLY PMN ABUNDANT GRAM POSITIVE COCCI ABUNDANT GRAM POSITIVE RODS MODERATE GRAM NEGATIVE RODS    Culture   Final    RARE Consistent with normal respiratory flora. Performed at [facility], [address] [place], [place]    Report Status 12/01/2019 FINAL  Final  Culture, respiratory (non-expectorated)     Status: None   Collection Time: 12/03/19  1:45 PM   Specimen: Tracheal Aspirate; Respiratory  Result Value Ref Range Status   Specimen Description TRACHEAL ASPIRATE  Final   Special Requests NONE  Final   Gram Stain   Final    RARE WBC PRESENT,BOTH PMN AND MONONUCLEAR RARE GRAM POSITIVE COCCI RARE YEAST RARE GRAM POSITIVE RODS Performed at [facility] Lab, [address], [place], [place]    Culture FEW CANDIDA TROPICALIS  Final   Report Status 12/06/2019 FINAL  Final  Culture, respiratory (non-expectorated)     Status: None   Collection Time: 12/04/19  1:53 PM   Specimen: Tracheal Aspirate; Respiratory  Result Value Ref Range Status   Specimen Description TRACHEAL ASPIRATE  Final   Special Requests NONE  Final   Gram Stain   Final    RARE WBC PRESENT, PREDOMINANTLY PMN FEW GRAM POSITIVE COCCI IN PAIRS IN CLUSTERS RARE GRAM NEGATIVE RODS  Culture   Final    Consistent with normal respiratory flora. Performed at [facility], [address], [place], [place] [address]    Report Status 12/06/2019 FINAL  Final  Culture, blood (routine x 2)     Status: None   Collection Time: 12/04/19  2:26 PM   Specimen: BLOOD LEFT HAND  Result Value Ref Range Status   Specimen Description BLOOD LEFT HAND  Final   Special Requests   Final    BOTTLES DRAWN AEROBIC ONLY Blood Culture results may not be optimal due to an inadequate volume of blood received in culture bottles   Culture   Final    NO GROWTH 5 DAYS Performed at [facility], [address] [place] [address], [place], [place]  [address]    Report Status 12/09/2019 FINAL  Final  Culture, blood (routine x 2)     Status: None   Collection Time: 12/04/19  2:26 PM   Specimen: BLOOD LEFT HAND  Result Value Ref Range Status   Specimen Description BLOOD LEFT HAND  Final   Special Requests   Final    BOTTLES DRAWN AEROBIC ONLY Blood Culture results may not be optimal due to an inadequate volume of blood received in culture bottles   Culture   Final    NO GROWTH 5 DAYS Performed at [facility], [address] [place], [place] [address]    Report Status 12/09/2019 FINAL  Final  Culture, respiratory     Status: None   Collection Time: 12/13/19  9:20 AM   Specimen: Tracheal Aspirate; Respiratory  Result Value Ref Range Status   Specimen Description TRACHEAL ASPIRATE  Final   Special Requests NONE  Final   Gram Stain RARE WBC PRESENT, PREDOMINANTLY PMN RARE YEAST   Final   Culture   Final    RARE Consistent with normal respiratory flora. Performed at [facility], [address], [place], [place] [address]    Report Status 12/15/2019 FINAL  Final  Culture, blood (routine x 2)     Status: None   Collection Time: 12/17/19  4:46 PM   Specimen: BLOOD LEFT HAND  Result Value Ref Range Status   Specimen Description BLOOD LEFT HAND  Final   Special Requests   Final    BOTTLES DRAWN AEROBIC AND ANAEROBIC Blood Culture adequate volume   Culture   Final    NO GROWTH 5 DAYS Performed at [facility], [address] [place], [place] [address]    Report Status 12/22/2019 FINAL  Final  Culture, blood (routine x 2)     Status: None   Collection Time: 12/17/19  5:34 PM   Specimen: BLOOD LEFT HAND  Result Value Ref Range Status   Specimen Description BLOOD LEFT HAND  Final   Special Requests   Final    BOTTLES DRAWN AEROBIC AND ANAEROBIC Blood Culture adequate volume   Culture   Final    NO GROWTH 5 DAYS Performed at [facility], [address], [place], [place] [address]    Report Status  12/22/2019 FINAL  Final  Culture, respiratory     Status: None   Collection Time: 12/17/19  5:40 PM   Specimen: Tracheal Aspirate; Respiratory  Result Value Ref Range Status   Specimen Description TRACHEAL ASPIRATE  Final   Special Requests NONE  Final   Gram Stain   Final    FEW WBC PRESENT, PREDOMINANTLY PMN FEW YEAST Performed at  [facility], [address] [place] [place], [place] [address]    Culture FEW CANDIDA TROPICALIS  Final   Report Status 12/19/2019 FINAL  Final  Culture, respiratory (non-expectorated)     Status: None   Collection Time: 12/24/19 10:25 AM   Specimen: Tracheal Aspirate; Respiratory  Result Value Ref Range Status   Specimen Description TRACHEAL ASPIRATE  Final   Special Requests NONE  Final   Gram Stain   Final    NO WBC SEEN RARE BUDDING YEAST SEEN Performed at [facility], [address] [place], [place] [address]    Culture RARE CANDIDA TROPICALIS  Final   Report Status 12/26/2019 FINAL  Final    Coagulation Studies: No results for input(s): LABPROT, INR in the last 72 hours.  Urinalysis: No results for input(s): COLORURINE, LABSPEC, PHURINE, GLUCOSEU, HGBUR, BILIRUBINUR, KETONESUR, PROTEINUR, UROBILINOGEN, NITRITE, LEUKOCYTESUR in the last 72 hours.  Invalid input(s): APPERANCEUR    Imaging: No results found.   Medications:       Assessment/ Plan:  54 y.o. male a PMHx of recent COVID-19 infection with resultant respiratory failure status post tracheostomy placement, hypertension, hyperlipidemia, tobacco abuse, ARDS, PEG tube placement who was admitted to [facility] on 11/17/2019 for ongoing treatment of acute respiratory failure.  1.  Acute kidney injury suspect most likely underlying ATN.  Patient with very significant azotemia at the initiation of dialysis treatments. -Patient due for another dialysis treatment today.  2.  Hyperkalemia.  Potassium normalized and was 3.5 at last check.  3.  Acute respiratory  failure.  Patient maintained on the ventilator at this time.  Weaning as per pulmonary/critical care.  4.  Hyponatremia.  Continue to periodically monitor serum sodium.  Serum sodium was 132 at last check.   LOS: 0 Shaylyn Bawa 8/27/20218:29 AM

## 2020-01-14 LAB — PREPARE RBC (CROSSMATCH)

## 2020-01-14 LAB — HEMOGLOBIN A1C
Hgb A1c MFr Bld: 5.1 % (ref 4.8–5.6)
Mean Plasma Glucose: 99.67 mg/dL

## 2020-01-14 LAB — HEMOGLOBIN AND HEMATOCRIT, BLOOD
HCT: 21.3 % — ABNORMAL LOW (ref 39.0–52.0)
Hemoglobin: 6.4 g/dL — CL (ref 13.0–17.0)

## 2020-01-15 DIAGNOSIS — J80 Acute respiratory distress syndrome: Secondary | ICD-10-CM | POA: Diagnosis not present

## 2020-01-15 DIAGNOSIS — N170 Acute kidney failure with tubular necrosis: Secondary | ICD-10-CM | POA: Diagnosis not present

## 2020-01-15 DIAGNOSIS — J9621 Acute and chronic respiratory failure with hypoxia: Secondary | ICD-10-CM | POA: Diagnosis not present

## 2020-01-15 DIAGNOSIS — U071 COVID-19: Secondary | ICD-10-CM | POA: Diagnosis not present

## 2020-01-15 LAB — TYPE AND SCREEN
ABO/RH(D): B POS
Antibody Screen: NEGATIVE
Unit division: 0
Unit division: 0

## 2020-01-15 LAB — HEMOGLOBIN AND HEMATOCRIT, BLOOD
HCT: 24.8 % — ABNORMAL LOW (ref 39.0–52.0)
Hemoglobin: 7.7 g/dL — ABNORMAL LOW (ref 13.0–17.0)

## 2020-01-15 LAB — BPAM RBC
Blood Product Expiration Date: 202109192359
Blood Product Expiration Date: 202109192359
ISSUE DATE / TIME: 202108271442
ISSUE DATE / TIME: 202108281208
Unit Type and Rh: 7300
Unit Type and Rh: 7300

## 2020-01-15 NOTE — Progress Notes (Signed)
Pulmonary Critical Care Medicine [facility]   PULMONARY CRITICAL CARE SERVICE  PROGRESS NOTE  Date of Service: 01/15/2020  Danny Leach  QQI:[id number]  DOB: Feb 21, 1966   DOA: 2019/11/29  Referring Physician: Carron Curie, MD  HPI: Danny Leach is a 54 y.o. male seen for follow up of Acute on Chronic Respiratory Failure.  Patient currently is on pressure assist control on 35% FiO2 appears to be a little bit more awake today but still is on maximal support on the ventilator.  Medications: Reviewed on Rounds  Physical Exam:  Vitals: Temperature is 98.9 pulse 95 respiratory rate 32 blood pressure is 137/57  Ventilator Settings mode of ventilation pressure assist control FiO2 is 35% tidal volume is 729 IP 32 PEEP 8  . General: Comfortable at this time . Eyes: Grossly normal lids, irises & conjunctiva . ENT: grossly tongue is normal . Neck: no obvious mass . Cardiovascular: S1 S2 normal no gallop . Respiratory: No rhonchi very coarse breath sounds . Abdomen: soft . Skin: no rash seen on limited exam . Musculoskeletal: not rigid . Psychiatric:unable to assess . Neurologic: no seizure no involuntary movements         Lab Data:   Basic Metabolic Panel: Recent Labs  Lab 01/09/20 0825 01/10/20 1533 01/11/20 0842 01/13/20 0854  NA 127*  128* 131* 132* 134*  K 4.1  4.2 3.5 3.5 4.3  CL 91*  92* 94* 95* 98  CO2 24  23 28 27 27   GLUCOSE 156*  157* 173* 163* 170*  BUN 155*  157* 115* 84* 63*  CREATININE 2.53*  2.60* 1.99* 1.46* 1.11  CALCIUM 8.4*  8.4* 8.2* 8.0* 7.8*  MG  --  2.0 1.8  --   PHOS 3.5  --  1.2* 1.9*    ABG: Recent Labs  Lab 01/09/20 0437  PHART 7.206*  PCO2ART 66.1*  PO2ART 68.2*  HCO3 25.2  O2SAT 94.5    Liver Function Tests: Recent Labs  Lab 01/09/20 0825 01/11/20 0842 01/13/20 0854  ALBUMIN 2.0* 1.7* 1.6*   No results for input(s): LIPASE, AMYLASE in the last 168 hours. No results for input(s): AMMONIA  in the last 168 hours.  CBC: Recent Labs  Lab 01/09/20 0825 01/09/20 0825 01/10/20 1533 01/11/20 0842 01/13/20 1049 01/14/20 0911 01/15/20 0436  WBC 15.4*  --  13.1* 11.7* 8.2  --   --   HGB 7.5*   < > 7.0* 7.3* 6.5* 6.4* 7.7*  HCT 25.0*   < > 23.6* 24.2* 22.0* 21.3* 24.8*  MCV 103.7*  --  105.4* 103.9* 107.3*  --   --   PLT 76*  --  68* 67* 48*  --   --    < > = values in this interval not displayed.    Cardiac Enzymes: No results for input(s): CKTOTAL, CKMB, CKMBINDEX, TROPONINI in the last 168 hours.  BNP (last 3 results) No results for input(s): BNP in the last 8760 hours.  ProBNP (last 3 results) No results for input(s): PROBNP in the last 8760 hours.  Radiological Exams: No results found.  Assessment/Plan Active Problems:   Acute on chronic respiratory failure with hypoxia (HCC)   COVID-19 virus infection   Acute respiratory distress syndrome (ARDS) due to COVID-19 virus (HCC)   Pneumonia due to COVID-19 virus   Acute renal failure due to tubular necrosis (HCC)   1. Acute on chronic respiratory failure with hypoxia on pressure control will titrate oxygen down if tolerated.  Patient still is on full support check RSB I 2. COVID-19 virus infection in resolution phase 3. Acute respiratory distress no change 4. Pneumonia due to COVID-19 treated 5. Acute renal failure tubular necrosis being followed by nephrology   I have personally seen and evaluated the patient, evaluated laboratory and imaging results, formulated the assessment and plan and placed orders. The Patient requires high complexity decision making with multiple systems involvement.  Rounds were done with the Respiratory Therapy Director and Staff therapists and discussed with nursing staff also.  Yevonne Pax, MD [facility] Pulmonary Critical Care Medicine Sleep Medicine

## 2020-01-16 ENCOUNTER — Other Ambulatory Visit (HOSPITAL_COMMUNITY): Payer: BLUE CROSS/BLUE SHIELD

## 2020-01-16 DIAGNOSIS — J9621 Acute and chronic respiratory failure with hypoxia: Secondary | ICD-10-CM | POA: Diagnosis not present

## 2020-01-16 DIAGNOSIS — J80 Acute respiratory distress syndrome: Secondary | ICD-10-CM | POA: Diagnosis not present

## 2020-01-16 DIAGNOSIS — U071 COVID-19: Secondary | ICD-10-CM | POA: Diagnosis not present

## 2020-01-16 DIAGNOSIS — N170 Acute kidney failure with tubular necrosis: Secondary | ICD-10-CM | POA: Diagnosis not present

## 2020-01-16 LAB — RENAL FUNCTION PANEL
Albumin: 2 g/dL — ABNORMAL LOW (ref 3.5–5.0)
Anion gap: 12 (ref 5–15)
BUN: 135 mg/dL — ABNORMAL HIGH (ref 6–20)
CO2: 26 mmol/L (ref 22–32)
Calcium: 8.3 mg/dL — ABNORMAL LOW (ref 8.9–10.3)
Chloride: 92 mmol/L — ABNORMAL LOW (ref 98–111)
Creatinine, Ser: 1.92 mg/dL — ABNORMAL HIGH (ref 0.61–1.24)
GFR calc Af Amer: 44 mL/min — ABNORMAL LOW (ref >60)
GFR calc non Af Amer: 38 mL/min — ABNORMAL LOW (ref >60)
Glucose, Bld: 150 mg/dL — ABNORMAL HIGH (ref 70–99)
Phosphorus: 3.1 mg/dL (ref 2.5–4.6)
Potassium: 4.4 mmol/L (ref 3.5–5.1)
Sodium: 130 mmol/L — ABNORMAL LOW (ref 135–145)

## 2020-01-16 LAB — CBC
HCT: 25.3 % — ABNORMAL LOW (ref 39.0–52.0)
Hemoglobin: 7.6 g/dL — ABNORMAL LOW (ref 13.0–17.0)
MCH: 30.5 pg (ref 26.0–34.0)
MCHC: 30 g/dL (ref 30.0–36.0)
MCV: 101.6 fL — ABNORMAL HIGH (ref 80.0–100.0)
Platelets: 33 10*3/uL — ABNORMAL LOW (ref 150–400)
RBC: 2.49 MIL/uL — ABNORMAL LOW (ref 4.22–5.81)
RDW: 22.8 % — ABNORMAL HIGH (ref 11.5–15.5)
WBC: 5.3 10*3/uL (ref 4.0–10.5)
nRBC: 3.2 % — ABNORMAL HIGH (ref 0.0–0.2)

## 2020-01-16 NOTE — Progress Notes (Signed)
[facility]  ROUNDING NOTE   Subjective:  Patient due for hemodialysis treatment again today. Azotemia significantly improved. Currently awake and alert and will follow simple commands.   Objective:  Vital signs in last 24 hours:  Temperature 97.1 pulse 85 respirations 29 blood pressure 129/61  Physical Exam: General: Critically ill-appearing  Head: Normocephalic, atraumatic. Moist oral mucosal membranes  Eyes: Anicteric  Neck: Tracheostomy in place  Lungs:  Bilateral rhonchi, vent assisted  Heart: S1S2 no rubs  Abdomen:  Soft, nontender, bowel sounds present  Extremities: 2+ generalized edema  Neurologic: Awake, alert  Skin: Upper extremity ecchymoses       Basic Metabolic Panel: Recent Labs  Lab 01/09/20 0825 01/09/20 0825 01/10/20 1533 01/11/20 0842 01/13/20 0854  NA 127*  128*  --  131* 132* 134*  K 4.1  4.2  --  3.5 3.5 4.3  CL 91*  92*  --  94* 95* 98  CO2 24  23  --  28 27 27   GLUCOSE 156*  157*  --  173* 163* 170*  BUN 155*  157*  --  115* 84* 63*  CREATININE 2.53*  2.60*  --  1.99* 1.46* 1.11  CALCIUM 8.4*  8.4*   < > 8.2* 8.0* 7.8*  MG  --   --  2.0 1.8  --   PHOS 3.5  --   --  1.2* 1.9*   < > = values in this interval not displayed.    Liver Function Tests: Recent Labs  Lab 01/09/20 0825 01/11/20 0842 01/13/20 0854  ALBUMIN 2.0* 1.7* 1.6*   No results for input(s): LIPASE, AMYLASE in the last 168 hours. No results for input(s): AMMONIA in the last 168 hours.  CBC: Recent Labs  Lab 01/09/20 0825 01/09/20 0825 01/10/20 1533 01/11/20 0842 01/13/20 1049 01/14/20 0911 01/15/20 0436  WBC 15.4*  --  13.1* 11.7* 8.2  --   --   HGB 7.5*   < > 7.0* 7.3* 6.5* 6.4* 7.7*  HCT 25.0*   < > 23.6* 24.2* 22.0* 21.3* 24.8*  MCV 103.7*  --  105.4* 103.9* 107.3*  --   --   PLT 76*  --  68* 67* 48*  --   --    < > = values in this interval not displayed.    Cardiac Enzymes: No results for input(s): CKTOTAL, CKMB, CKMBINDEX,  TROPONINI in the last 168 hours.  BNP: Invalid input(s): POCBNP  CBG: No results for input(s): GLUCAP in the last 168 hours.  Microbiology: Results for orders placed or performed during the hospital encounter of 11/17/2019  Culture, respiratory     Status: None   Collection Time: 11/29/19  9:21 AM   Specimen: Tracheal Aspirate; Respiratory  Result Value Ref Range Status   Specimen Description TRACHEAL ASPIRATE  Final   Special Requests NONE  Final   Gram Stain   Final    FEW WBC PRESENT, PREDOMINANTLY PMN ABUNDANT GRAM POSITIVE COCCI ABUNDANT GRAM POSITIVE RODS MODERATE GRAM NEGATIVE RODS    Culture   Final    RARE Consistent with normal respiratory flora. Performed at [facility], [address], [place], [place]    Report Status 12/01/2019 FINAL  Final  Culture, respiratory (non-expectorated)     Status: None   Collection Time: 12/03/19  1:45 PM   Specimen: Tracheal Aspirate; Respiratory  Result Value Ref Range Status   Specimen Description TRACHEAL ASPIRATE  Final   Special Requests NONE  Final  Gram Stain   Final    RARE WBC PRESENT,BOTH PMN AND MONONUCLEAR RARE GRAM POSITIVE COCCI RARE YEAST RARE GRAM POSITIVE RODS Performed at [facility], [address], [place], [place] [address]    Culture FEW CANDIDA TROPICALIS  Final   Report Status 12/06/2019 FINAL  Final  Culture, respiratory (non-expectorated)     Status: None   Collection Time: 12/04/19  1:53 PM   Specimen: Tracheal Aspirate; Respiratory  Result Value Ref Range Status   Specimen Description TRACHEAL ASPIRATE  Final   Special Requests NONE  Final   Gram Stain   Final    RARE WBC PRESENT, PREDOMINANTLY PMN FEW GRAM POSITIVE COCCI IN PAIRS IN CLUSTERS RARE GRAM NEGATIVE RODS    Culture   Final    Consistent with normal respiratory flora. Performed at [facility], [address] [place], [place] [address]    Report Status 12/06/2019 FINAL  Final  Culture, blood  (routine x 2)     Status: None   Collection Time: 12/04/19  2:26 PM   Specimen: BLOOD LEFT HAND  Result Value Ref Range Status   Specimen Description BLOOD LEFT HAND  Final   Special Requests   Final    BOTTLES DRAWN AEROBIC ONLY Blood Culture results may not be optimal due to an inadequate volume of blood received in culture bottles   Culture   Final    NO GROWTH 5 DAYS Performed at [facility], [address], [place], [place] [address]    Report Status 12/09/2019 FINAL  Final  Culture, blood (routine x 2)     Status: None   Collection Time: 12/04/19  2:26 PM   Specimen: BLOOD LEFT HAND  Result Value Ref Range Status   Specimen Description BLOOD LEFT HAND  Final   Special Requests   Final    BOTTLES DRAWN AEROBIC ONLY Blood Culture results may not be optimal due to an inadequate volume of blood received in culture bottles   Culture   Final    NO GROWTH 5 DAYS Performed at [facility], [address] [place], [place] [address]    Report Status 12/09/2019 FINAL  Final  Culture, respiratory     Status: None   Collection Time: 12/13/19  9:20 AM   Specimen: Tracheal Aspirate; Respiratory  Result Value Ref Range Status   Specimen Description TRACHEAL ASPIRATE  Final   Special Requests NONE  Final   Gram Stain RARE WBC PRESENT, PREDOMINANTLY PMN RARE YEAST   Final   Culture   Final    RARE Consistent with normal respiratory flora. Performed at [facility], [address] [place], [place] [address]    Report Status 12/15/2019 FINAL  Final  Culture, blood (routine x 2)     Status: None   Collection Time: 12/17/19  4:46 PM   Specimen: BLOOD LEFT HAND  Result Value Ref Range Status   Specimen Description BLOOD LEFT HAND  Final   Special Requests   Final    BOTTLES DRAWN AEROBIC AND ANAEROBIC Blood Culture adequate volume   Culture   Final    NO GROWTH 5 DAYS Performed at [facility], [address], [place], [place] [address]    Report Status  12/22/2019 FINAL  Final  Culture, blood (routine x 2)     Status: None   Collection Time: 12/17/19  5:34 PM   Specimen: BLOOD LEFT HAND  Result Value  Ref Range Status   Specimen Description BLOOD LEFT HAND  Final   Special Requests   Final    BOTTLES DRAWN AEROBIC AND ANAEROBIC Blood Culture adequate volume   Culture   Final    NO GROWTH 5 DAYS Performed at [facility], [address], [place], [place] [address]    Report Status 12/22/2019 FINAL  Final  Culture, respiratory     Status: None   Collection Time: 12/17/19  5:40 PM   Specimen: Tracheal Aspirate; Respiratory  Result Value Ref Range Status   Specimen Description TRACHEAL ASPIRATE  Final   Special Requests NONE  Final   Gram Stain   Final    FEW WBC PRESENT, PREDOMINANTLY PMN FEW YEAST Performed at [facility], [address], [place], [place] [address]    Culture FEW CANDIDA TROPICALIS  Final   Report Status 12/19/2019 FINAL  Final  Culture, respiratory (non-expectorated)     Status: None   Collection Time: 12/24/19 10:25 AM   Specimen: Tracheal Aspirate; Respiratory  Result Value Ref Range Status   Specimen Description TRACHEAL ASPIRATE  Final   Special Requests NONE  Final   Gram Stain   Final    NO WBC SEEN RARE BUDDING YEAST SEEN Performed at [facility] Lab, [address], [place], [place] [address]    Culture RARE CANDIDA TROPICALIS  Final   Report Status 12/26/2019 FINAL  Final    Coagulation Studies: No results for input(s): LABPROT, INR in the last 72 hours.  Urinalysis: No results for input(s): COLORURINE, LABSPEC, PHURINE, GLUCOSEU, HGBUR, BILIRUBINUR, KETONESUR, PROTEINUR, UROBILINOGEN, NITRITE, LEUKOCYTESUR in the last 72 hours.  Invalid input(s): APPERANCEUR    Imaging: DG CHEST PORT 1 VIEW  Result Date: 01/16/2020 CLINICAL DATA:  Respiratory failure EXAM: PORTABLE CHEST 1 VIEW COMPARISON:  Seven days ago FINDINGS: Diffuse coarse interstitial opacity on both sides. No  effusion or pneumothorax. Central line on the right with tip at the right atrium. Tracheostomy tube in place. Stable enlarged heart size. 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 No change from prior. Electronically Signed   By: Marnee Spring M.D.   On: 01/16/2020 06:05     Medications:       Assessment/ Plan:  54 y.o. male a PMHx of recent COVID-19 infection with resultant respiratory failure status post tracheostomy placement, hypertension, hyperlipidemia, tobacco abuse, ARDS, PEG tube placement who was admitted to [facility] on 12/05/2019 for ongoing treatment of acute respiratory failure.  1.  Acute kidney injury suspect most likely underlying ATN.  Patient with very significant azotemia at the initiation of dialysis treatments. -Azotemia significantly improved. BUN down to 63. Continue to monitor renal parameters. Due for dialysis again today.  2.  Hyperkalemia. Potassium remains normal at 4.3. Continue to monitor.  3.  Acute respiratory failure. Patient still requiring a significant amount of FiO2 as well as PEEP. Weaning as per pulmonary/critical care.  4.  Hyponatremia. Serum sodium up to 134 at last check. Continue to monitor. Should stabilize with ongoing dialysis treatments.   LOS: 0 Kelly Ranieri 8/30/20217:56 AM

## 2020-01-16 NOTE — Progress Notes (Signed)
Pulmonary Critical Care Medicine [facility] [place]   PULMONARY CRITICAL CARE SERVICE  PROGRESS NOTE  Date of Service: 01/16/2020  Danny Leach  XKG:[id number]  DOB: 06/09/1965   DOA: 2019-12-03  Referring Physician: Carron Curie, MD  HPI: Danny Leach is a 54 y.o. male seen for follow up of Acute on Chronic Respiratory Failure.  Patient right now is on pressure control mode has been on 55% FiO2 with volumes of about 547 PEEP of 8 IP 32  Medications: Reviewed on Rounds  Physical Exam:  Vitals: Temperature is 97.1 pulse 85 respiratory 29 blood pressure is 129/61 saturations 100%  Ventilator Settings on pressure control FiO2 55% tidal volume 547 PEEP 8 IP 32  . General: Comfortable at this time . Eyes: Grossly normal lids, irises & conjunctiva . ENT: grossly tongue is normal . Neck: no obvious mass . Cardiovascular: S1 S2 normal no gallop . Respiratory: Scattered rhonchi very coarse breath sounds . Abdomen: soft . Skin: no rash seen on limited exam . Musculoskeletal: not rigid . Psychiatric:unable to assess . Neurologic: no seizure no involuntary movements         Lab Data:   Basic Metabolic Panel: Recent Labs  Lab 01/10/20 1533 01/11/20 0842 01/13/20 0854 01/16/20 0755  NA 131* 132* 134* 130*  K 3.5 3.5 4.3 4.4  CL 94* 95* 98 92*  CO2 28 27 27 26   GLUCOSE 173* 163* 170* 150*  BUN 115* 84* 63* 135*  CREATININE 1.99* 1.46* 1.11 1.92*  CALCIUM 8.2* 8.0* 7.8* 8.3*  MG 2.0 1.8  --   --   PHOS  --  1.2* 1.9* 3.1    ABG: No results for input(s): PHART, PCO2ART, PO2ART, HCO3, O2SAT in the last 168 hours.  Liver Function Tests: Recent Labs  Lab 01/11/20 0842 01/13/20 0854 01/16/20 0755  ALBUMIN 1.7* 1.6* 2.0*   No results for input(s): LIPASE, AMYLASE in the last 168 hours. No results for input(s): AMMONIA in the last 168 hours.  CBC: Recent Labs  Lab 01/10/20 1533 01/10/20 1533 01/11/20 0842 01/13/20 1049 01/14/20 0911  01/15/20 0436 01/16/20 0755  WBC 13.1*  --  11.7* 8.2  --   --  5.3  HGB 7.0*   < > 7.3* 6.5* 6.4* 7.7* 7.6*  HCT 23.6*   < > 24.2* 22.0* 21.3* 24.8* 25.3*  MCV 105.4*  --  103.9* 107.3*  --   --  101.6*  PLT 68*  --  67* 48*  --   --  33*   < > = values in this interval not displayed.    Cardiac Enzymes: No results for input(s): CKTOTAL, CKMB, CKMBINDEX, TROPONINI in the last 168 hours.  BNP (last 3 results) No results for input(s): BNP in the last 8760 hours.  ProBNP (last 3 results) No results for input(s): PROBNP in the last 8760 hours.  Radiological Exams: DG CHEST PORT 1 VIEW  Result Date: 01/16/2020 CLINICAL DATA:  Respiratory failure EXAM: PORTABLE CHEST 1 VIEW COMPARISON:  Seven days ago FINDINGS: Diffuse coarse interstitial opacity on both sides. No effusion or pneumothorax. Central line on the right with tip at the right atrium. Tracheostomy tube in place. Stable enlarged heart size. 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 No change from prior. Electronically Signed   By: 01/18/2020 M.D.   On: 01/16/2020 06:05    Assessment/Plan Active Problems:   Acute on chronic respiratory failure with hypoxia (HCC)   COVID-19 virus infection  Acute respiratory distress syndrome (ARDS) due to COVID-19 virus (HCC)   Pneumonia due to COVID-19 virus   Acute renal failure due to tubular necrosis (HCC)   1. Acute on chronic respiratory failure hypoxia we will continue with pressure control mode right now on 55% FiO2 P is 32 PEEP is 8 2. COVID-19 virus infection in resolution 3. ARDS treated we will continue to follow 4. Pneumonia due to COVID-19 treated we will continue with supportive care 5. Acute renal failure tubular necrosis at baseline   I have personally seen and evaluated the patient, evaluated laboratory and imaging results, formulated the assessment and plan and placed orders. The Patient requires high complexity decision making with  multiple systems involvement.  Rounds were done with the Respiratory Therapy Director and Staff therapists and discussed with nursing staff also.  Yevonne Pax, MD [facility] Pulmonary Critical Care Medicine Sleep Medicine

## 2020-01-17 DIAGNOSIS — J9621 Acute and chronic respiratory failure with hypoxia: Secondary | ICD-10-CM | POA: Diagnosis not present

## 2020-01-17 DIAGNOSIS — U071 COVID-19: Secondary | ICD-10-CM | POA: Diagnosis not present

## 2020-01-17 DIAGNOSIS — J80 Acute respiratory distress syndrome: Secondary | ICD-10-CM | POA: Diagnosis not present

## 2020-01-17 DIAGNOSIS — N170 Acute kidney failure with tubular necrosis: Secondary | ICD-10-CM | POA: Diagnosis not present

## 2020-01-17 NOTE — Progress Notes (Addendum)
Pulmonary Critical Care Medicine [facility] [place]   PULMONARY CRITICAL CARE SERVICE  PROGRESS NOTE  Date of Service: 01/17/2020  Lui Bellis  BZJ:[id number]  DOB: 07-19-1965   DOA: 2019/12/11  Referring Physician: Carron Curie, MD  HPI: Lary Eckardt is a 54 y.o. male seen for follow up of Acute on Chronic Respiratory Failure.  Patient is currently on full support on the vent has plans to wean today on pressure support currently on rate of 35 and FiO2 of 50% satting well no distress.  Medications: Reviewed on Rounds  Physical Exam:  Vitals: Pulse 92 respirations 35 BP 117/61 O2 sat 95% temp 97.4  Ventilator Settings ventilator mode AC PC rate of 35 respiratory pressure 32 PEEP of 6 and FiO2 50%  . General: Comfortable at this time . Eyes: Grossly normal lids, irises & conjunctiva . ENT: grossly tongue is normal . Neck: no obvious mass . Cardiovascular: S1 S2 normal no gallop . Respiratory: No rales or rhonchi noted . Abdomen: soft . Skin: no rash seen on limited exam . Musculoskeletal: not rigid . Psychiatric:unable to assess . Neurologic: no seizure no involuntary movements         Lab Data:   Basic Metabolic Panel: Recent Labs  Lab 01/10/20 1533 01/11/20 0842 01/13/20 0854 01/16/20 0755  NA 131* 132* 134* 130*  K 3.5 3.5 4.3 4.4  CL 94* 95* 98 92*  CO2 28 27 27 26   GLUCOSE 173* 163* 170* 150*  BUN 115* 84* 63* 135*  CREATININE 1.99* 1.46* 1.11 1.92*  CALCIUM 8.2* 8.0* 7.8* 8.3*  MG 2.0 1.8  --   --   PHOS  --  1.2* 1.9* 3.1    ABG: No results for input(s): PHART, PCO2ART, PO2ART, HCO3, O2SAT in the last 168 hours.  Liver Function Tests: Recent Labs  Lab 01/11/20 0842 01/13/20 0854 01/16/20 0755  ALBUMIN 1.7* 1.6* 2.0*   No results for input(s): LIPASE, AMYLASE in the last 168 hours. No results for input(s): AMMONIA in the last 168 hours.  CBC: Recent Labs  Lab 01/10/20 1533 01/10/20 1533 01/11/20 0842 01/13/20 1049  01/14/20 0911 01/15/20 0436 01/16/20 0755  WBC 13.1*  --  11.7* 8.2  --   --  5.3  HGB 7.0*   < > 7.3* 6.5* 6.4* 7.7* 7.6*  HCT 23.6*   < > 24.2* 22.0* 21.3* 24.8* 25.3*  MCV 105.4*  --  103.9* 107.3*  --   --  101.6*  PLT 68*  --  67* 48*  --   --  33*   < > = values in this interval not displayed.    Cardiac Enzymes: No results for input(s): CKTOTAL, CKMB, CKMBINDEX, TROPONINI in the last 168 hours.  BNP (last 3 results) No results for input(s): BNP in the last 8760 hours.  ProBNP (last 3 results) No results for input(s): PROBNP in the last 8760 hours.  Radiological Exams: DG CHEST PORT 1 VIEW  Result Date: 01/16/2020 CLINICAL DATA:  Respiratory failure EXAM: PORTABLE CHEST 1 VIEW COMPARISON:  Seven days ago FINDINGS: Diffuse coarse interstitial opacity on both sides. No effusion or pneumothorax. Central line on the right with tip at the right atrium. Tracheostomy tube in place. Stable enlarged heart size. 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 No change from prior. Electronically Signed   By: 01/18/2020 M.D.   On: 01/16/2020 06:05    Assessment/Plan Active Problems:   Acute on chronic respiratory failure with  hypoxia (HCC)   COVID-19 virus infection   Acute respiratory distress syndrome (ARDS) due to COVID-19 virus (HCC)   Pneumonia due to COVID-19 virus   Acute renal failure due to tubular necrosis (HCC)   1. Acute on chronic respiratory failure hypoxia patient will attempt to wean today on pressure support with 50% FiO2 patient is currently satting well on full support will continue aggressive pulmonary toilet supportive measures. 2. COVID-19 virus infection in resolution 3. ARDS treated we will continue to follow 4. Pneumonia due to COVID-19 treated we will continue with supportive care 5. Acute renal failure tubular necrosis at baseline   I have personally seen and evaluated the patient, evaluated laboratory and imaging results,  formulated the assessment and plan and placed orders. The Patient requires high complexity decision making with multiple systems involvement.  Rounds were done with the Respiratory Therapy Director and Staff therapists and discussed with nursing staff also.  Yevonne Pax, MD [facility] Pulmonary Critical Care Medicine Sleep Medicine

## 2020-01-18 DIAGNOSIS — J9621 Acute and chronic respiratory failure with hypoxia: Secondary | ICD-10-CM | POA: Diagnosis not present

## 2020-01-18 DIAGNOSIS — N170 Acute kidney failure with tubular necrosis: Secondary | ICD-10-CM | POA: Diagnosis not present

## 2020-01-18 DIAGNOSIS — J80 Acute respiratory distress syndrome: Secondary | ICD-10-CM | POA: Diagnosis not present

## 2020-01-18 DIAGNOSIS — U071 COVID-19: Secondary | ICD-10-CM | POA: Diagnosis not present

## 2020-01-18 LAB — CBC
HCT: 24.3 % — ABNORMAL LOW (ref 39.0–52.0)
Hemoglobin: 7.5 g/dL — ABNORMAL LOW (ref 13.0–17.0)
MCH: 31 pg (ref 26.0–34.0)
MCHC: 30.9 g/dL (ref 30.0–36.0)
MCV: 100.4 fL — ABNORMAL HIGH (ref 80.0–100.0)
Platelets: 24 10*3/uL — CL (ref 150–400)
RBC: 2.42 MIL/uL — ABNORMAL LOW (ref 4.22–5.81)
RDW: 22.6 % — ABNORMAL HIGH (ref 11.5–15.5)
WBC: 4.9 10*3/uL (ref 4.0–10.5)
nRBC: 1 % — ABNORMAL HIGH (ref 0.0–0.2)

## 2020-01-18 LAB — RENAL FUNCTION PANEL
Albumin: 1.9 g/dL — ABNORMAL LOW (ref 3.5–5.0)
Anion gap: 10 (ref 5–15)
BUN: 130 mg/dL — ABNORMAL HIGH (ref 6–20)
CO2: 26 mmol/L (ref 22–32)
Calcium: 8.4 mg/dL — ABNORMAL LOW (ref 8.9–10.3)
Chloride: 94 mmol/L — ABNORMAL LOW (ref 98–111)
Creatinine, Ser: 1.83 mg/dL — ABNORMAL HIGH (ref 0.61–1.24)
GFR calc Af Amer: 47 mL/min — ABNORMAL LOW (ref >60)
GFR calc non Af Amer: 40 mL/min — ABNORMAL LOW (ref >60)
Glucose, Bld: 113 mg/dL — ABNORMAL HIGH (ref 70–99)
Phosphorus: 1.9 mg/dL — ABNORMAL LOW (ref 2.5–4.6)
Potassium: 4.3 mmol/L (ref 3.5–5.1)
Sodium: 130 mmol/L — ABNORMAL LOW (ref 135–145)

## 2020-01-18 LAB — PATHOLOGIST SMEAR REVIEW

## 2020-01-18 NOTE — Progress Notes (Addendum)
Pulmonary Critical Care Medicine [facility] [place]   PULMONARY CRITICAL CARE SERVICE  PROGRESS NOTE  Date of Service: 01/18/2020  Tycen Dockter  ZOX:[id number]  DOB: 06-16-1965   DOA: 2019/12/12  Referring Physician: Carron Curie, MD  HPI: Jamarl Pew is a 54 y.o. male seen for follow up of Acute on Chronic Respiratory Failure.  Patient remains on full support at this time has a goal of 4 hours on pressure support today RT has not had a chance to begin weaning yet currently on rate of 35 and FiO2 of 50%.  Medications: Reviewed on Rounds  Physical Exam:  Vitals: Pulse 101 respirations 38 BP 124/55 O2 sat 94% temp 98.1  Ventilator Settings ventilator mode AC PC rate of 35 respiratory pressure 32 PEEP of 6 with FiO2 of 50%  . General: Comfortable at this time . Eyes: Grossly normal lids, irises & conjunctiva . ENT: grossly tongue is normal . Neck: no obvious mass . Cardiovascular: S1 S2 normal no gallop . Respiratory: Coarse breath sounds . Abdomen: soft . Skin: no rash seen on limited exam . Musculoskeletal: not rigid . Psychiatric:unable to assess . Neurologic: no seizure no involuntary movements         Lab Data:   Basic Metabolic Panel: Recent Labs  Lab 01/13/20 0854 01/16/20 0755 01/18/20 1105  NA 134* 130* 130*  K 4.3 4.4 4.3  CL 98 92* 94*  CO2 27 26 26   GLUCOSE 170* 150* 113*  BUN 63* 135* 130*  CREATININE 1.11 1.92* 1.83*  CALCIUM 7.8* 8.3* 8.4*  PHOS 1.9* 3.1 1.9*    ABG: No results for input(s): PHART, PCO2ART, PO2ART, HCO3, O2SAT in the last 168 hours.  Liver Function Tests: Recent Labs  Lab 01/13/20 0854 01/16/20 0755 01/18/20 1105  ALBUMIN 1.6* 2.0* 1.9*   No results for input(s): LIPASE, AMYLASE in the last 168 hours. No results for input(s): AMMONIA in the last 168 hours.  CBC: Recent Labs  Lab 01/13/20 1049 01/14/20 0911 01/15/20 0436 01/16/20 0755 01/18/20 1105  WBC 8.2  --   --  5.3 4.9  HGB 6.5* 6.4*  7.7* 7.6* 7.5*  HCT 22.0* 21.3* 24.8* 25.3* 24.3*  MCV 107.3*  --   --  101.6* 100.4*  PLT 48*  --   --  33* 24*    Cardiac Enzymes: No results for input(s): CKTOTAL, CKMB, CKMBINDEX, TROPONINI in the last 168 hours.  BNP (last 3 results) No results for input(s): BNP in the last 8760 hours.  ProBNP (last 3 results) No results for input(s): PROBNP in the last 8760 hours.  Radiological Exams: No results found.  Assessment/Plan Active Problems:   Acute on chronic respiratory failure with hypoxia (HCC)   COVID-19 virus infection   Acute respiratory distress syndrome (ARDS) due to COVID-19 virus (HCC)   Pneumonia due to COVID-19 virus   Acute renal failure due to tubular necrosis (HCC)   1. Acute on chronic respiratory failure hypoxia patient will attempt to wean today on pressure support with 50% FiO2 patient is currently satting well on full support will continue aggressive pulmonary toilet supportive measures. 2. COVID-19 virus infection in resolution 3. ARDS treated we will continue to follow 4. Pneumonia due to COVID-19 treated we will continue with supportive care 5. Acute renal failure tubular necrosis at baseline   I have personally seen and evaluated the patient, evaluated laboratory and imaging results, formulated the assessment and plan and placed orders. The Patient requires high complexity decision making  with multiple systems involvement.  Rounds were done with the Respiratory Therapy Director and Staff therapists and discussed with nursing staff also.  Allyne Gee, MD [facility] Pulmonary Critical Care Medicine Sleep Medicine

## 2020-01-18 NOTE — Progress Notes (Signed)
[facility]  ROUNDING NOTE   Subjective:  Patient seen and evaluated at bedside. Still on the ventilator. Requiring 50% FiO2 with 6 of PEEP.   Objective:  Vital signs in last 24 hours:  Temperature 98.1 pulse 101 respirations 38 blood pressure 124/55  Physical Exam: General: Critically ill-appearing  Head: Normocephalic, atraumatic. Moist oral mucosal membranes  Eyes: Anicteric  Neck: Tracheostomy in place  Lungs:  Bilateral rhonchi, vent assisted  Heart: S1S2 no rubs  Abdomen:  Soft, nontender, bowel sounds present  Extremities: 2+ generalized edema  Neurologic: Awake, alert  Skin: Upper extremity ecchymoses       Basic Metabolic Panel: Recent Labs  Lab 01/13/20 0854 01/16/20 0755  NA 134* 130*  K 4.3 4.4  CL 98 92*  CO2 27 26  GLUCOSE 170* 150*  BUN 63* 135*  CREATININE 1.11 1.92*  CALCIUM 7.8* 8.3*  PHOS 1.9* 3.1    Liver Function Tests: Recent Labs  Lab 01/13/20 0854 01/16/20 0755  ALBUMIN 1.6* 2.0*   No results for input(s): LIPASE, AMYLASE in the last 168 hours. No results for input(s): AMMONIA in the last 168 hours.  CBC: Recent Labs  Lab 01/13/20 1049 01/14/20 0911 01/15/20 0436 01/16/20 0755  WBC 8.2  --   --  5.3  HGB 6.5* 6.4* 7.7* 7.6*  HCT 22.0* 21.3* 24.8* 25.3*  MCV 107.3*  --   --  101.6*  PLT 48*  --   --  33*    Cardiac Enzymes: No results for input(s): CKTOTAL, CKMB, CKMBINDEX, TROPONINI in the last 168 hours.  BNP: Invalid input(s): POCBNP  CBG: No results for input(s): GLUCAP in the last 168 hours.  Microbiology: Results for orders placed or performed during the hospital encounter of 11/24/2019  Culture, respiratory     Status: None   Collection Time: 11/29/19  9:21 AM   Specimen: Tracheal Aspirate; Respiratory  Result Value Ref Range Status   Specimen Description TRACHEAL ASPIRATE  Final   Special Requests NONE  Final   Gram Stain   Final    FEW WBC PRESENT, PREDOMINANTLY PMN ABUNDANT GRAM POSITIVE  COCCI ABUNDANT GRAM POSITIVE RODS MODERATE GRAM NEGATIVE RODS    Culture   Final    RARE Consistent with normal respiratory flora. Performed at [facility], [address], [place], [place] [address]    Report Status 12/01/2019 FINAL  Final  Culture, respiratory (non-expectorated)     Status: None   Collection Time: 12/03/19  1:45 PM   Specimen: Tracheal Aspirate; Respiratory  Result Value Ref Range Status   Specimen Description TRACHEAL ASPIRATE  Final   Special Requests NONE  Final   Gram Stain   Final    RARE WBC PRESENT,BOTH PMN AND MONONUCLEAR RARE GRAM POSITIVE COCCI RARE YEAST RARE GRAM POSITIVE RODS Performed at [facility], [address] [place], [place] [address]    Culture FEW CANDIDA TROPICALIS  Final   Report Status 12/06/2019 FINAL  Final  Culture, respiratory (non-expectorated)     Status: None   Collection Time: 12/04/19  1:53 PM   Specimen: Tracheal Aspirate; Respiratory  Result Value Ref Range Status   Specimen Description TRACHEAL ASPIRATE  Final   Special Requests NONE  Final   Gram Stain   Final    RARE WBC PRESENT, PREDOMINANTLY PMN FEW GRAM POSITIVE COCCI IN PAIRS IN CLUSTERS RARE GRAM NEGATIVE RODS    Culture   Final    Consistent with normal respiratory flora.  Performed at [facility], [address], [place], [place] [address]    Report Status 12/06/2019 FINAL  Final  Culture, blood (routine x 2)     Status: None   Collection Time: 12/04/19  2:26 PM   Specimen: BLOOD LEFT HAND  Result Value Ref Range Status   Specimen Description BLOOD LEFT HAND  Final   Special Requests   Final    BOTTLES DRAWN AEROBIC ONLY Blood Culture results may not be optimal due to an inadequate volume of blood received in culture bottles   Culture   Final    NO GROWTH 5 DAYS Performed at [facility], [address], [place], [place] [address]    Report Status 12/09/2019 FINAL  Final  Culture, blood (routine x 2)     Status: None    Collection Time: 12/04/19  2:26 PM   Specimen: BLOOD LEFT HAND  Result Value Ref Range Status   Specimen Description BLOOD LEFT HAND  Final   Special Requests   Final    BOTTLES DRAWN AEROBIC ONLY Blood Culture results may not be optimal due to an inadequate volume of blood received in culture bottles   Culture   Final    NO GROWTH 5 DAYS Performed at [facility], [address] [place], [place] [address]    Report Status 12/09/2019 FINAL  Final  Culture, respiratory     Status: None   Collection Time: 12/13/19  9:20 AM   Specimen: Tracheal Aspirate; Respiratory  Result Value Ref Range Status   Specimen Description TRACHEAL ASPIRATE  Final   Special Requests NONE  Final   Gram Stain RARE WBC PRESENT, PREDOMINANTLY PMN RARE YEAST   Final   Culture   Final    RARE Consistent with normal respiratory flora. Performed at [facility], [address] [place], [place] [address]    Report Status 12/15/2019 FINAL  Final  Culture, blood (routine x 2)     Status: None   Collection Time: 12/17/19  4:46 PM   Specimen: BLOOD LEFT HAND  Result Value Ref Range Status   Specimen Description BLOOD LEFT HAND  Final   Special Requests   Final    BOTTLES DRAWN AEROBIC AND ANAEROBIC Blood Culture adequate volume   Culture   Final    NO GROWTH 5 DAYS Performed at [facility], [address] [place], [place] [address]    Report Status 12/22/2019 FINAL  Final  Culture, blood (routine x 2)     Status: None   Collection Time: 12/17/19  5:34 PM   Specimen: BLOOD LEFT HAND  Result Value Ref Range Status   Specimen Description BLOOD LEFT HAND  Final   Special Requests   Final    BOTTLES DRAWN AEROBIC AND ANAEROBIC Blood Culture adequate volume   Culture   Final    NO GROWTH 5 DAYS Performed at [facility], [address], [place], [place] [address]    Report Status 12/22/2019 FINAL  Final  Culture, respiratory     Status: None   Collection Time: 12/17/19  5:40 PM    Specimen: Tracheal Aspirate; Respiratory  Result Value Ref Range Status   Specimen Description TRACHEAL ASPIRATE  Final   Special Requests NONE  Final   Gram Stain   Final    FEW WBC PRESENT, PREDOMINANTLY PMN FEW YEAST Performed at [facility], [address] [place], [place] [address]  Culture FEW CANDIDA TROPICALIS  Final   Report Status 12/19/2019 FINAL  Final  Culture, respiratory (non-expectorated)     Status: None   Collection Time: 12/24/19 10:25 AM   Specimen: Tracheal Aspirate; Respiratory  Result Value Ref Range Status   Specimen Description TRACHEAL ASPIRATE  Final   Special Requests NONE  Final   Gram Stain   Final    NO WBC SEEN RARE BUDDING YEAST SEEN Performed at [facility], [address], [place], [place] [address]    Culture RARE CANDIDA TROPICALIS  Final   Report Status 12/26/2019 FINAL  Final    Coagulation Studies: No results for input(s): LABPROT, INR in the last 72 hours.  Urinalysis: No results for input(s): COLORURINE, LABSPEC, PHURINE, GLUCOSEU, HGBUR, BILIRUBINUR, KETONESUR, PROTEINUR, UROBILINOGEN, NITRITE, LEUKOCYTESUR in the last 72 hours.  Invalid input(s): APPERANCEUR    Imaging: No results found.   Medications:       Assessment/ Plan:  54 y.o. male a PMHx of recent COVID-19 infection with resultant respiratory failure status post tracheostomy placement, hypertension, hyperlipidemia, tobacco abuse, ARDS, PEG tube placement who was admitted to [facility] on 11/29/2019 for ongoing treatment of acute respiratory failure.  1.  Acute kidney injury suspect most likely underlying ATN.  Patient with very significant azotemia at the initiation of dialysis treatments. -Patient due for dialysis treatment again today. Continue Monday through Friday dialysis treatments for now.  2.  Hyperkalemia. Potassium at target at 4.4.  3.  Acute respiratory failure. FiO2 has come down a bit to 50% with PEEP of 5. Weaning as per  pulmonary/critical care. Third spacing of fluid remains an issue.  4.  Hyponatremia. Serum sodium was 130 on Monday. Anticipate that this will be better now as he is undergoing dialysis Monday through Friday.   LOS: 0 Danny Leach 9/1/202110:57 AM

## 2020-01-19 DIAGNOSIS — J80 Acute respiratory distress syndrome: Secondary | ICD-10-CM | POA: Diagnosis not present

## 2020-01-19 DIAGNOSIS — U071 COVID-19: Secondary | ICD-10-CM | POA: Diagnosis not present

## 2020-01-19 DIAGNOSIS — J9621 Acute and chronic respiratory failure with hypoxia: Secondary | ICD-10-CM | POA: Diagnosis not present

## 2020-01-19 DIAGNOSIS — N170 Acute kidney failure with tubular necrosis: Secondary | ICD-10-CM | POA: Diagnosis not present

## 2020-01-19 LAB — BLOOD GAS, ARTERIAL
Acid-Base Excess: 0.1 mmol/L (ref 0.0–2.0)
Bicarbonate: 28.2 mmol/L — ABNORMAL HIGH (ref 20.0–28.0)
FIO2: 60
O2 Saturation: 99.7 %
Patient temperature: 36.7
pCO2 arterial: 82.8 mmHg (ref 32.0–48.0)
pH, Arterial: 7.156 — CL (ref 7.350–7.450)
pO2, Arterial: 176 mmHg — ABNORMAL HIGH (ref 83.0–108.0)

## 2020-01-19 LAB — BASIC METABOLIC PANEL
Anion gap: 10 (ref 5–15)
BUN: 83 mg/dL — ABNORMAL HIGH (ref 6–20)
CO2: 27 mmol/L (ref 22–32)
Calcium: 8.4 mg/dL — ABNORMAL LOW (ref 8.9–10.3)
Chloride: 97 mmol/L — ABNORMAL LOW (ref 98–111)
Creatinine, Ser: 1.54 mg/dL — ABNORMAL HIGH (ref 0.61–1.24)
GFR calc Af Amer: 58 mL/min — ABNORMAL LOW (ref >60)
GFR calc non Af Amer: 50 mL/min — ABNORMAL LOW (ref >60)
Glucose, Bld: 141 mg/dL — ABNORMAL HIGH (ref 70–99)
Potassium: 4.3 mmol/L (ref 3.5–5.1)
Sodium: 134 mmol/L — ABNORMAL LOW (ref 135–145)

## 2020-01-19 LAB — CBC
HCT: 26.4 % — ABNORMAL LOW (ref 39.0–52.0)
Hemoglobin: 8 g/dL — ABNORMAL LOW (ref 13.0–17.0)
MCH: 31.1 pg (ref 26.0–34.0)
MCHC: 30.3 g/dL (ref 30.0–36.0)
MCV: 102.7 fL — ABNORMAL HIGH (ref 80.0–100.0)
Platelets: 28 10*3/uL — CL (ref 150–400)
RBC: 2.57 MIL/uL — ABNORMAL LOW (ref 4.22–5.81)
RDW: 22.7 % — ABNORMAL HIGH (ref 11.5–15.5)
WBC: 7.2 10*3/uL (ref 4.0–10.5)
nRBC: 0.6 % — ABNORMAL HIGH (ref 0.0–0.2)

## 2020-01-19 LAB — MAGNESIUM: Magnesium: 2.1 mg/dL (ref 1.7–2.4)

## 2020-01-19 NOTE — Progress Notes (Addendum)
Pulmonary Critical Care Medicine [facility] [place]   PULMONARY CRITICAL CARE SERVICE  PROGRESS NOTE  Date of Service: 01/19/2020  [name]  [id number]  DOB: 04-24-1966   DOA: 11/17/2019  Referring Physician: Carron Curie, MD  HPI: Danny Leach is a 54 y.o. male seen for follow up of Acute on Chronic Respiratory Failure.  Patient mains on full support on the vent at this time assist-control mode rate of 35 and FiO2 of 60% no distress noted at this time  Medications: Reviewed on Rounds  Physical Exam:  Vitals: Pulse 94 respirations 30 BP 117/56 O2 sat 99% temp 95.5  Ventilator Settings ventilator mode AC PC rate 35 expiratory pressure 32 PEEP of 7 with an FiO2 of 60%  . General: Comfortable at this time . Eyes: Grossly normal lids, irises & conjunctiva . ENT: grossly tongue is normal . Neck: no obvious mass . Cardiovascular: S1 S2 normal no gallop . Respiratory: Coarse breath sounds . Abdomen: soft . Skin: no rash seen on limited exam . Musculoskeletal: not rigid . Psychiatric:unable to assess . Neurologic: no seizure no involuntary movements         Lab Data:   Basic Metabolic Panel: Recent Labs  Lab 01/13/20 0854 01/16/20 0755 01/18/20 1105 01/19/20 0520  NA 134* 130* 130* 134*  K 4.3 4.4 4.3 4.3  CL 98 92* 94* 97*  CO2 27 26 26 27   GLUCOSE 170* 150* 113* 141*  BUN 63* 135* 130* 83*  CREATININE 1.11 1.92* 1.83* 1.54*  CALCIUM 7.8* 8.3* 8.4* 8.4*  MG  --   --   --  2.1  PHOS 1.9* 3.1 1.9*  --     ABG: Recent Labs  Lab 01/19/20 1133  PHART 7.156*  PCO2ART 82.8*  PO2ART 176*  HCO3 28.2*  O2SAT 99.7    Liver Function Tests: Recent Labs  Lab 01/13/20 0854 01/16/20 0755 01/18/20 1105  ALBUMIN 1.6* 2.0* 1.9*   No results for input(s): LIPASE, AMYLASE in the last 168 hours. No results for input(s): AMMONIA in the last 168 hours.  CBC: Recent Labs  Lab 01/13/20 1049 01/13/20 1049 01/14/20 0911 01/15/20 0436  01/16/20 0755 01/18/20 1105 01/19/20 0520  WBC 8.2  --   --   --  5.3 4.9 7.2  HGB 6.5*   < > 6.4* 7.7* 7.6* 7.5* 8.0*  HCT 22.0*   < > 21.3* 24.8* 25.3* 24.3* 26.4*  MCV 107.3*  --   --   --  101.6* 100.4* 102.7*  PLT 48*  --   --   --  33* 24* 28*   < > = values in this interval not displayed.    Cardiac Enzymes: No results for input(s): CKTOTAL, CKMB, CKMBINDEX, TROPONINI in the last 168 hours.  BNP (last 3 results) No results for input(s): BNP in the last 8760 hours.  ProBNP (last 3 results) No results for input(s): PROBNP in the last 8760 hours.  Radiological Exams: No results found.  Assessment/Plan Active Problems:   Acute on chronic respiratory failure with hypoxia (HCC)   COVID-19 virus infection   Acute respiratory distress syndrome (ARDS) due to COVID-19 virus (HCC)   Pneumonia due to COVID-19 virus   Acute renal failure due to tubular necrosis (HCC)   1. Acute on chronic respiratory failure hypoxiapatient will continue to attempt weaning per protocol.  FiO2 is currently 60% that will be decreased first.  We will continue aggressive pulmonary toilet supportive measures. 2. COVID-19 virus infection in  resolution 3. ARDS treated we will continue to follow 4. Pneumonia due to COVID-19 treated we will continue with supportive care 5. Acute renal failure tubular necrosis at baseline   I have personally seen and evaluated the patient, evaluated laboratory and imaging results, formulated the assessment and plan and placed orders. The Patient requires high complexity decision making with multiple systems involvement.  Rounds were done with the Respiratory Therapy Director and Staff therapists and discussed with nursing staff also.  Yevonne Pax, MD [facility] Pulmonary Critical Care Medicine Sleep Medicine

## 2020-01-20 DIAGNOSIS — N170 Acute kidney failure with tubular necrosis: Secondary | ICD-10-CM | POA: Diagnosis not present

## 2020-01-20 DIAGNOSIS — U071 COVID-19: Secondary | ICD-10-CM | POA: Diagnosis not present

## 2020-01-20 DIAGNOSIS — J9621 Acute and chronic respiratory failure with hypoxia: Secondary | ICD-10-CM | POA: Diagnosis not present

## 2020-01-20 DIAGNOSIS — J80 Acute respiratory distress syndrome: Secondary | ICD-10-CM | POA: Diagnosis not present

## 2020-01-20 LAB — BASIC METABOLIC PANEL
Anion gap: 12 (ref 5–15)
BUN: 74 mg/dL — ABNORMAL HIGH (ref 6–20)
CO2: 25 mmol/L (ref 22–32)
Calcium: 7.9 mg/dL — ABNORMAL LOW (ref 8.9–10.3)
Chloride: 97 mmol/L — ABNORMAL LOW (ref 98–111)
Creatinine, Ser: 1.31 mg/dL — ABNORMAL HIGH (ref 0.61–1.24)
GFR calc Af Amer: >60 mL/min (ref >60)
GFR calc non Af Amer: >60 mL/min (ref >60)
Glucose, Bld: 140 mg/dL — ABNORMAL HIGH (ref 70–99)
Potassium: 3.8 mmol/L (ref 3.5–5.1)
Sodium: 134 mmol/L — ABNORMAL LOW (ref 135–145)

## 2020-01-20 LAB — CBC
HCT: 22.9 % — ABNORMAL LOW (ref 39.0–52.0)
Hemoglobin: 6.6 g/dL — CL (ref 13.0–17.0)
MCH: 30 pg (ref 26.0–34.0)
MCHC: 28.8 g/dL — ABNORMAL LOW (ref 30.0–36.0)
MCV: 104.1 fL — ABNORMAL HIGH (ref 80.0–100.0)
Platelets: 21 10*3/uL — CL (ref 150–400)
RBC: 2.2 MIL/uL — ABNORMAL LOW (ref 4.22–5.81)
RDW: 22.2 % — ABNORMAL HIGH (ref 11.5–15.5)
WBC: 4.1 10*3/uL (ref 4.0–10.5)
nRBC: 1.7 % — ABNORMAL HIGH (ref 0.0–0.2)

## 2020-01-20 LAB — BLOOD GAS, ARTERIAL
Acid-base deficit: 0.6 mmol/L (ref 0.0–2.0)
Bicarbonate: 26.8 mmol/L (ref 20.0–28.0)
FIO2: 65
O2 Saturation: 99.1 %
Patient temperature: 36.6
pCO2 arterial: 71.8 mmHg (ref 32.0–48.0)
pH, Arterial: 7.194 — CL (ref 7.350–7.450)
pO2, Arterial: 138 mmHg — ABNORMAL HIGH (ref 83.0–108.0)

## 2020-01-20 LAB — RENAL FUNCTION PANEL
Albumin: 1.7 g/dL — ABNORMAL LOW (ref 3.5–5.0)
Anion gap: 9 (ref 5–15)
BUN: 125 mg/dL — ABNORMAL HIGH (ref 6–20)
CO2: 27 mmol/L (ref 22–32)
Calcium: 8.3 mg/dL — ABNORMAL LOW (ref 8.9–10.3)
Chloride: 93 mmol/L — ABNORMAL LOW (ref 98–111)
Creatinine, Ser: 1.91 mg/dL — ABNORMAL HIGH (ref 0.61–1.24)
GFR calc Af Amer: 44 mL/min — ABNORMAL LOW (ref >60)
GFR calc non Af Amer: 38 mL/min — ABNORMAL LOW (ref >60)
Glucose, Bld: 124 mg/dL — ABNORMAL HIGH (ref 70–99)
Phosphorus: 2.4 mg/dL — ABNORMAL LOW (ref 2.5–4.6)
Potassium: 3.8 mmol/L (ref 3.5–5.1)
Sodium: 129 mmol/L — ABNORMAL LOW (ref 135–145)

## 2020-01-20 LAB — PREPARE RBC (CROSSMATCH)

## 2020-01-20 LAB — MAGNESIUM
Magnesium: 2.1 mg/dL (ref 1.7–2.4)
Magnesium: 1.9 mg/dL (ref 1.7–2.4)

## 2020-01-20 NOTE — Progress Notes (Addendum)
Pulmonary Critical Care Medicine [facility] [place]   PULMONARY CRITICAL CARE SERVICE  PROGRESS NOTE  Date of Service: 01/20/2020  Danny Leach  VHQ:[id number]  DOB: [birth year]   DOA: December 18, 2019  Referring Physician: Carron Curie, MD  HPI: Danny Leach is a 54 y.o. male seen for follow up of Acute on Chronic Respiratory Failure.  Patient remains on full support on the vent at this time currently on a rate of 35 and FiO2 of 65% have some blood tinged thick secretions.  Medications: Reviewed on Rounds  Physical Exam:  Vitals: Pulse 95 respirations 39 BP 101/56 O2 sat 90% temp 98.0  Ventilator Settings ventilator mode AC PC rate of 35 respiratory pressure of 32 PEEP of 7 with FiO2 of 65%   General: Comfortable at this time  Eyes: Grossly normal lids, irises & conjunctiva  ENT: grossly tongue is normal  Neck: no obvious mass  Cardiovascular: S1 S2 normal no gallop  Respiratory: No rales or rhonchi noted  Abdomen: soft  Skin: no rash seen on limited exam  Musculoskeletal: not rigid  Psychiatric:unable to assess  Neurologic: no seizure no involuntary movements         Lab Data:   Basic Metabolic Panel: Recent Labs  Lab 01/16/20 0755 01/18/20 1105 01/19/20 0520 01/20/20 0500  NA 130* 130* 134* 129*  K 4.4 4.3 4.3 3.8  CL 92* 94* 97* 93*  CO2 26 26 27 27   GLUCOSE 150* 113* 141* 124*  BUN 135* 130* 83* 125*  CREATININE 1.92* 1.83* 1.54* 1.91*  CALCIUM 8.3* 8.4* 8.4* 8.3*  MG  --   --  2.1 2.1  PHOS 3.1 1.9*  --  2.4*    ABG: Recent Labs  Lab 01/19/20 1133 01/20/20 1023  PHART 7.156* 7.194*  PCO2ART 82.8* 71.8*  PO2ART 176* 138*  HCO3 28.2* 26.8  O2SAT 99.7 99.1    Liver Function Tests: Recent Labs  Lab 01/16/20 0755 01/18/20 1105 01/20/20 0500  ALBUMIN 2.0* 1.9* 1.7*   No results for input(s): LIPASE, AMYLASE in the last 168 hours. No results for input(s): AMMONIA in the last 168 hours.  CBC: Recent Labs  Lab  01/15/20 0436 01/16/20 0755 01/18/20 1105 01/19/20 0520 01/20/20 1013  WBC  --  5.3 4.9 7.2 4.1  HGB 7.7* 7.6* 7.5* 8.0* 6.6*  HCT 24.8* 25.3* 24.3* 26.4* 22.9*  MCV  --  101.6* 100.4* 102.7* 104.1*  PLT  --  33* 24* 28* 21*    Cardiac Enzymes: No results for input(s): CKTOTAL, CKMB, CKMBINDEX, TROPONINI in the last 168 hours.  BNP (last 3 results) No results for input(s): BNP in the last 8760 hours.  ProBNP (last 3 results) No results for input(s): PROBNP in the last 8760 hours.  Radiological Exams: No results found.  Assessment/Plan Active Problems:   Acute on chronic respiratory failure with hypoxia (HCC)   COVID-19 virus infection   Acute respiratory distress syndrome (ARDS) due to COVID-19 virus (HCC)   Pneumonia due to COVID-19 virus   Acute renal failure due to tubular necrosis (HCC)   1. Acute on chronic respiratory failure hypoxiapatient will continue to attempt weaning per protocol.  FiO2 is currently 65% that will be decreased first.  We will continue aggressive pulmonary toilet supportive measures.  Patient needs to be DNR at this time. 2. COVID-19 virus infection in resolution 3. ARDS treated we will continue to follow 4. Pneumonia due to COVID-19 treated we will continue with supportive care 5. Acute renal failure  tubular necrosis at baseline   I have personally seen and evaluated the patient, evaluated laboratory and imaging results, formulated the assessment and plan and placed orders. The Patient requires high complexity decision making with multiple systems involvement.  Rounds were done with the Respiratory Therapy Director and Staff therapists and discussed with nursing staff also.  Yevonne Pax, MD [facility] Pulmonary Critical Care Medicine Sleep Medicine

## 2020-01-20 NOTE — Progress Notes (Signed)
[facility]  ROUNDING NOTE   Subjective:  Patient due for hemodialysis later today. Still on the ventilator. Requiring 50% FiO2 with PEEP of 7.   Objective:  Vital signs in last 24 hours:  Temperature 98 pulse 95 respiration 39 blood pressure 101/56  Physical Exam: General: Critically ill-appearing  Head: Normocephalic, atraumatic. Moist oral mucosal membranes  Eyes: Anicteric  Neck: Tracheostomy in place  Lungs:  Bilateral rhonchi, vent assisted  Heart: S1S2 no rubs  Abdomen:  Soft, nontender, bowel sounds present  Extremities: 2+ generalized edema  Neurologic: Awake, alert  Skin: Upper extremity ecchymoses       Basic Metabolic Panel: Recent Labs  Lab 01/13/20 0854 01/13/20 0854 01/16/20 0755 01/18/20 1105 01/19/20 0520  NA 134*  --  130* 130* 134*  K 4.3  --  4.4 4.3 4.3  CL 98  --  92* 94* 97*  CO2 27  --  26 26 27   GLUCOSE 170*  --  150* 113* 141*  BUN 63*  --  135* 130* 83*  CREATININE 1.11  --  1.92* 1.83* 1.54*  CALCIUM 7.8*   < > 8.3* 8.4* 8.4*  MG  --   --   --   --  2.1  PHOS 1.9*  --  3.1 1.9*  --    < > = values in this interval not displayed.    Liver Function Tests: Recent Labs  Lab 01/13/20 0854 01/16/20 0755 01/18/20 1105  ALBUMIN 1.6* 2.0* 1.9*   No results for input(s): LIPASE, AMYLASE in the last 168 hours. No results for input(s): AMMONIA in the last 168 hours.  CBC: Recent Labs  Lab 01/13/20 1049 01/13/20 1049 01/14/20 0911 01/15/20 0436 01/16/20 0755 01/18/20 1105 01/19/20 0520  WBC 8.2  --   --   --  5.3 4.9 7.2  HGB 6.5*   < > 6.4* 7.7* 7.6* 7.5* 8.0*  HCT 22.0*   < > 21.3* 24.8* 25.3* 24.3* 26.4*  MCV 107.3*  --   --   --  101.6* 100.4* 102.7*  PLT 48*  --   --   --  33* 24* 28*   < > = values in this interval not displayed.    Cardiac Enzymes: No results for input(s): CKTOTAL, CKMB, CKMBINDEX, TROPONINI in the last 168 hours.  BNP: Invalid input(s): POCBNP  CBG: No results for input(s): GLUCAP  in the last 168 hours.  Microbiology: Results for orders placed or performed during the hospital encounter of 12/06/2019  Culture, respiratory     Status: None   Collection Time: 11/29/19  9:21 AM   Specimen: Tracheal Aspirate; Respiratory  Result Value Ref Range Status   Specimen Description TRACHEAL ASPIRATE  Final   Special Requests NONE  Final   Gram Stain   Final    FEW WBC PRESENT, PREDOMINANTLY PMN ABUNDANT GRAM POSITIVE COCCI ABUNDANT GRAM POSITIVE RODS MODERATE GRAM NEGATIVE RODS    Culture   Final    RARE Consistent with normal respiratory flora. Performed at [facility], [address], [place], [place]    Report Status 12/01/2019 FINAL  Final  Culture, respiratory (non-expectorated)     Status: None   Collection Time: 12/03/19  1:45 PM   Specimen: Tracheal Aspirate; Respiratory  Result Value Ref Range Status   Specimen Description TRACHEAL ASPIRATE  Final   Special Requests NONE  Final   Gram Stain   Final    RARE WBC PRESENT,BOTH PMN AND MONONUCLEAR  RARE GRAM POSITIVE COCCI RARE YEAST RARE GRAM POSITIVE RODS Performed at [facility], [address] [place], [place] [address]    Culture FEW CANDIDA TROPICALIS  Final   Report Status 12/06/2019 FINAL  Final  Culture, respiratory (non-expectorated)     Status: None   Collection Time: 12/04/19  1:53 PM   Specimen: Tracheal Aspirate; Respiratory  Result Value Ref Range Status   Specimen Description TRACHEAL ASPIRATE  Final   Special Requests NONE  Final   Gram Stain   Final    RARE WBC PRESENT, PREDOMINANTLY PMN FEW GRAM POSITIVE COCCI IN PAIRS IN CLUSTERS RARE GRAM NEGATIVE RODS    Culture   Final    Consistent with normal respiratory flora. Performed at [facility], [address], [place], [place] [address]    Report Status 12/06/2019 FINAL  Final  Culture, blood (routine x 2)     Status: None   Collection Time: 12/04/19  2:26 PM   Specimen: BLOOD LEFT HAND  Result Value  Ref Range Status   Specimen Description BLOOD LEFT HAND  Final   Special Requests   Final    BOTTLES DRAWN AEROBIC ONLY Blood Culture results may not be optimal due to an inadequate volume of blood received in culture bottles   Culture   Final    NO GROWTH 5 DAYS Performed at [facility], [address], [place], [place] [address]    Report Status 12/09/2019 FINAL  Final  Culture, blood (routine x 2)     Status: None   Collection Time: 12/04/19  2:26 PM   Specimen: BLOOD LEFT HAND  Result Value Ref Range Status   Specimen Description BLOOD LEFT HAND  Final   Special Requests   Final    BOTTLES DRAWN AEROBIC ONLY Blood Culture results may not be optimal due to an inadequate volume of blood received in culture bottles   Culture   Final    NO GROWTH 5 DAYS Performed at [facility], [address], [place], [place] [address]    Report Status 12/09/2019 FINAL  Final  Culture, respiratory     Status: None   Collection Time: 12/13/19  9:20 AM   Specimen: Tracheal Aspirate; Respiratory  Result Value Ref Range Status   Specimen Description TRACHEAL ASPIRATE  Final   Special Requests NONE  Final   Gram Stain RARE WBC PRESENT, PREDOMINANTLY PMN RARE YEAST   Final   Culture   Final    RARE Consistent with normal respiratory flora. Performed at [facility], [address] [place], [place] [address]    Report Status 12/15/2019 FINAL  Final  Culture, blood (routine x 2)     Status: None   Collection Time: 12/17/19  4:46 PM   Specimen: BLOOD LEFT HAND  Result Value Ref Range Status   Specimen Description BLOOD LEFT HAND  Final   Special Requests   Final    BOTTLES DRAWN AEROBIC AND ANAEROBIC Blood Culture adequate volume   Culture   Final    NO GROWTH 5 DAYS Performed at [facility], [address] [place], [place] [address]    Report Status 12/22/2019 FINAL  Final  Culture, blood (routine x 2)     Status: None   Collection Time: 12/17/19  5:34 PM    Specimen: BLOOD LEFT HAND  Result Value Ref Range Status   Specimen Description BLOOD LEFT HAND  Final  Special Requests   Final    BOTTLES DRAWN AEROBIC AND ANAEROBIC Blood Culture adequate volume   Culture   Final    NO GROWTH 5 DAYS Performed at [facility], [address], [place], [place] [address]    Report Status 12/22/2019 FINAL  Final  Culture, respiratory     Status: None   Collection Time: 12/17/19  5:40 PM   Specimen: Tracheal Aspirate; Respiratory  Result Value Ref Range Status   Specimen Description TRACHEAL ASPIRATE  Final   Special Requests NONE  Final   Gram Stain   Final    FEW WBC PRESENT, PREDOMINANTLY PMN FEW YEAST Performed at [facility], [address] [place], [place] [address]    Culture FEW CANDIDA TROPICALIS  Final   Report Status 12/19/2019 FINAL  Final  Culture, respiratory (non-expectorated)     Status: None   Collection Time: 12/24/19 10:25 AM   Specimen: Tracheal Aspirate; Respiratory  Result Value Ref Range Status   Specimen Description TRACHEAL ASPIRATE  Final   Special Requests NONE  Final   Gram Stain   Final    NO WBC SEEN RARE BUDDING YEAST SEEN Performed at [facility], [address] [place], [place] [address]    Culture RARE CANDIDA TROPICALIS  Final   Report Status 12/26/2019 FINAL  Final    Coagulation Studies: No results for input(s): LABPROT, INR in the last 72 hours.  Urinalysis: No results for input(s): COLORURINE, LABSPEC, PHURINE, GLUCOSEU, HGBUR, BILIRUBINUR, KETONESUR, PROTEINUR, UROBILINOGEN, NITRITE, LEUKOCYTESUR in the last 72 hours.  Invalid input(s): APPERANCEUR    Imaging: No results found.   Medications:       Assessment/ Plan:  54 y.o. male a PMHx of recent COVID-19 infection with resultant respiratory failure status post tracheostomy placement, hypertension, hyperlipidemia, tobacco abuse, ARDS, PEG tube placement who was admitted to [facility] on 11/22/2019 for ongoing  treatment of acute respiratory failure.  1.  Acute kidney injury suspect most likely underlying ATN.  Patient with very significant azotemia at the initiation of dialysis treatments. -Patient maintained on Monday through Friday dialysis treatments.  Still has considerable edema.  Hypoalbuminemia playing a role here.  Albumin currently 1.9.  2.  Hyperkalemia.  Resolved, potassium 4.3.  3.  Acute respiratory failure.  Patient maintained on the ventilator.  He continues to require 50% FiO2 with PEEP of 7 now.  4.  Hyponatremia.  Sodium up to 134.  Continue to monitor.   LOS: 0 Hermann Dottavio 9/3/20217:52 AM

## 2020-01-21 DIAGNOSIS — J9621 Acute and chronic respiratory failure with hypoxia: Secondary | ICD-10-CM | POA: Diagnosis not present

## 2020-01-21 DIAGNOSIS — U071 COVID-19: Secondary | ICD-10-CM | POA: Diagnosis not present

## 2020-01-21 DIAGNOSIS — J80 Acute respiratory distress syndrome: Secondary | ICD-10-CM | POA: Diagnosis not present

## 2020-01-21 DIAGNOSIS — N170 Acute kidney failure with tubular necrosis: Secondary | ICD-10-CM | POA: Diagnosis not present

## 2020-01-21 LAB — BLOOD GAS, ARTERIAL
Acid-Base Excess: 2.6 mmol/L — ABNORMAL HIGH (ref 0.0–2.0)
Bicarbonate: 28.1 mmol/L — ABNORMAL HIGH (ref 20.0–28.0)
FIO2: 65
O2 Saturation: 94.9 %
Patient temperature: 37.4
pCO2 arterial: 57.1 mmHg — ABNORMAL HIGH (ref 32.0–48.0)
pH, Arterial: 7.316 — ABNORMAL LOW (ref 7.350–7.450)
pO2, Arterial: 64.2 mmHg — ABNORMAL LOW (ref 83.0–108.0)

## 2020-01-21 LAB — CBC
HCT: 23.4 % — ABNORMAL LOW (ref 39.0–52.0)
Hemoglobin: 7.3 g/dL — ABNORMAL LOW (ref 13.0–17.0)
MCH: 30.3 pg (ref 26.0–34.0)
MCHC: 31.2 g/dL (ref 30.0–36.0)
MCV: 97.1 fL (ref 80.0–100.0)
Platelets: 25 10*3/uL — CL (ref 150–400)
RBC: 2.41 MIL/uL — ABNORMAL LOW (ref 4.22–5.81)
RDW: 22.3 % — ABNORMAL HIGH (ref 11.5–15.5)
WBC: 5.5 10*3/uL (ref 4.0–10.5)
nRBC: 0.7 % — ABNORMAL HIGH (ref 0.0–0.2)

## 2020-01-21 LAB — HEMOGLOBIN AND HEMATOCRIT, BLOOD
HCT: 22.4 % — ABNORMAL LOW (ref 39.0–52.0)
Hemoglobin: 7.3 g/dL — ABNORMAL LOW (ref 13.0–17.0)

## 2020-01-21 LAB — OCCULT BLOOD X 1 CARD TO LAB, STOOL: Fecal Occult Bld: POSITIVE — AB

## 2020-01-21 NOTE — Progress Notes (Signed)
Pulmonary Critical Care Medicine [facility] [place]   PULMONARY CRITICAL CARE SERVICE  PROGRESS NOTE  Date of Service: 01/21/2020  Danny Leach  QPY:[id number]  DOB: 1966-01-03   DOA: Dec 16, 2019  Referring Physician: Carron Curie, MD  HPI: Danny Leach is a 54 y.o. male seen for follow up of Acute on Chronic Respiratory Failure.  Patient is on pressure control mode currently on 65% FiO2 with an IP 32 PEEP of 7 ABG done today looks much better  Medications: Reviewed on Rounds  Physical Exam:  Vitals: Temperature 99.6 pulse 105 respiratory rate 31 blood pressure is 128/63 saturations 98%  Ventilator Settings on pressure control FiO2 65% IP 32 PEEP 7   General: Comfortable at this time  Eyes: Grossly normal lids, irises & conjunctiva  ENT: grossly tongue is normal  Neck: no obvious mass  Cardiovascular: S1 S2 normal no gallop  Respiratory: No rhonchi no rales are noted at this time  Abdomen: soft  Skin: no rash seen on limited exam  Musculoskeletal: not rigid  Psychiatric:unable to assess  Neurologic: no seizure no involuntary movements         Lab Data:   Basic Metabolic Panel: Recent Labs  Lab 01/16/20 0755 01/18/20 1105 01/19/20 0520 01/20/20 0500 01/20/20 2200  NA 130* 130* 134* 129* 134*  K 4.4 4.3 4.3 3.8 3.8  CL 92* 94* 97* 93* 97*  CO2 26 26 27 27 25   GLUCOSE 150* 113* 141* 124* 140*  BUN 135* 130* 83* 125* 74*  CREATININE 1.92* 1.83* 1.54* 1.91* 1.31*  CALCIUM 8.3* 8.4* 8.4* 8.3* 7.9*  MG  --   --  2.1 2.1 1.9  PHOS 3.1 1.9*  --  2.4*  --     ABG: Recent Labs  Lab 01/19/20 1133 01/20/20 1023 01/21/20 0916  PHART 7.156* 7.194* 7.316*  PCO2ART 82.8* 71.8* 57.1*  PO2ART 176* 138* 64.2*  HCO3 28.2* 26.8 28.1*  O2SAT 99.7 99.1 94.9    Liver Function Tests: Recent Labs  Lab 01/16/20 0755 01/18/20 1105 01/20/20 0500  ALBUMIN 2.0* 1.9* 1.7*   No results for input(s): LIPASE, AMYLASE in the last 168 hours. No  results for input(s): AMMONIA in the last 168 hours.  CBC: Recent Labs  Lab 01/15/20 0436 01/16/20 0755 01/18/20 1105 01/19/20 0520 01/20/20 1013  WBC  --  5.3 4.9 7.2 4.1  HGB 7.7* 7.6* 7.5* 8.0* 6.6*  HCT 24.8* 25.3* 24.3* 26.4* 22.9*  MCV  --  101.6* 100.4* 102.7* 104.1*  PLT  --  33* 24* 28* 21*    Cardiac Enzymes: No results for input(s): CKTOTAL, CKMB, CKMBINDEX, TROPONINI in the last 168 hours.  BNP (last 3 results) No results for input(s): BNP in the last 8760 hours.  ProBNP (last 3 results) No results for input(s): PROBNP in the last 8760 hours.  Radiological Exams: No results found.  Assessment/Plan Active Problems:   Acute on chronic respiratory failure with hypoxia (HCC)   COVID-19 virus infection   Acute respiratory distress syndrome (ARDS) due to COVID-19 virus (HCC)   Pneumonia due to COVID-19 virus   Acute renal failure due to tubular necrosis (HCC)   1. Acute on chronic respiratory failure hypoxia we will continue with full support on pressure control titrate oxygen continue pulmonary toilet. 2. COVID-19 virus infection in recovery phase 3. Acute respiratory distress due to COVID-19 slow improvement 4. Pneumonia due to COVID-19 again slow improvement 5. Acute renal failure being followed by nephrology will continue with supportive care  I have personally seen and evaluated the patient, evaluated laboratory and imaging results, formulated the assessment and plan and placed orders. The Patient requires high complexity decision making with multiple systems involvement.  Rounds were done with the Respiratory Therapy Director and Staff therapists and discussed with nursing staff also.  Yevonne Pax, MD [facility] Pulmonary Critical Care Medicine Sleep Medicine

## 2020-01-22 DIAGNOSIS — N170 Acute kidney failure with tubular necrosis: Secondary | ICD-10-CM | POA: Diagnosis not present

## 2020-01-22 DIAGNOSIS — J9621 Acute and chronic respiratory failure with hypoxia: Secondary | ICD-10-CM | POA: Diagnosis not present

## 2020-01-22 DIAGNOSIS — J80 Acute respiratory distress syndrome: Secondary | ICD-10-CM | POA: Diagnosis not present

## 2020-01-22 DIAGNOSIS — U071 COVID-19: Secondary | ICD-10-CM | POA: Diagnosis not present

## 2020-01-22 LAB — CBC
HCT: 25.4 % — ABNORMAL LOW (ref 39.0–52.0)
HCT: 22.1 % — ABNORMAL LOW (ref 39.0–52.0)
Hemoglobin: 7.4 g/dL — ABNORMAL LOW (ref 13.0–17.0)
Hemoglobin: 6.5 g/dL — CL (ref 13.0–17.0)
MCH: 29.6 pg (ref 26.0–34.0)
MCH: 29.4 pg (ref 26.0–34.0)
MCHC: 29.1 g/dL — ABNORMAL LOW (ref 30.0–36.0)
MCHC: 29.4 g/dL — ABNORMAL LOW (ref 30.0–36.0)
MCV: 101.6 fL — ABNORMAL HIGH (ref 80.0–100.0)
MCV: 100 fL (ref 80.0–100.0)
Platelets: 23 10*3/uL — CL (ref 150–400)
Platelets: 22 10*3/uL — CL (ref 150–400)
RBC: 2.5 MIL/uL — ABNORMAL LOW (ref 4.22–5.81)
RBC: 2.21 MIL/uL — ABNORMAL LOW (ref 4.22–5.81)
RDW: 22.1 % — ABNORMAL HIGH (ref 11.5–15.5)
RDW: 21.5 % — ABNORMAL HIGH (ref 11.5–15.5)
WBC: 5.5 10*3/uL (ref 4.0–10.5)
WBC: 4.3 10*3/uL (ref 4.0–10.5)
nRBC: 1.1 % — ABNORMAL HIGH (ref 0.0–0.2)
nRBC: 0.9 % — ABNORMAL HIGH (ref 0.0–0.2)

## 2020-01-22 LAB — BASIC METABOLIC PANEL
Anion gap: 11 (ref 5–15)
BUN: 128 mg/dL — ABNORMAL HIGH (ref 6–20)
CO2: 26 mmol/L (ref 22–32)
Calcium: 8.5 mg/dL — ABNORMAL LOW (ref 8.9–10.3)
Chloride: 92 mmol/L — ABNORMAL LOW (ref 98–111)
Creatinine, Ser: 1.94 mg/dL — ABNORMAL HIGH (ref 0.61–1.24)
GFR calc Af Amer: 44 mL/min — ABNORMAL LOW (ref >60)
GFR calc non Af Amer: 38 mL/min — ABNORMAL LOW (ref >60)
Glucose, Bld: 153 mg/dL — ABNORMAL HIGH (ref 70–99)
Potassium: 4.4 mmol/L (ref 3.5–5.1)
Sodium: 129 mmol/L — ABNORMAL LOW (ref 135–145)

## 2020-01-22 LAB — PREPARE RBC (CROSSMATCH)

## 2020-01-22 NOTE — Progress Notes (Signed)
Pulmonary Critical Care Medicine [facility] [place]   PULMONARY CRITICAL CARE SERVICE  PROGRESS NOTE  Date of Service: 01/22/2020  Danny Leach  MVH:[id number]  DOB: May 22, 1965   DOA: 12/06/2019  Referring Physician: Carron Curie, MD  HPI: Danny Leach is a 54 y.o. male seen for follow up of Acute on Chronic Respiratory Failure.  Patient right now is on comfortable without distress afebrile  Medications: Reviewed on Rounds  Physical Exam:  Vitals: Temperature is 95.9 pulse 84 respiratory 38 blood pressure is 103/52 saturations 100%  Ventilator Settings remains on the ventilator and full support pressure control mode FiO2 treatment decreased down to 60%  . General: Comfortable at this time . Eyes: Grossly normal lids, irises & conjunctiva . ENT: grossly tongue is normal . Neck: no obvious mass . Cardiovascular: S1 S2 normal no gallop . Respiratory: Scattered rhonchi expansion is equal . Abdomen: soft . Skin: no rash seen on limited exam . Musculoskeletal: not rigid . Psychiatric:unable to assess . Neurologic: no seizure no involuntary movements         Lab Data:   Basic Metabolic Panel: Recent Labs  Lab 01/16/20 0755 01/16/20 0755 01/18/20 1105 01/19/20 0520 01/20/20 0500 01/20/20 2200 01/22/20 0814  NA 130*   < > 130* 134* 129* 134* 129*  K 4.4   < > 4.3 4.3 3.8 3.8 4.4  CL 92*   < > 94* 97* 93* 97* 92*  CO2 26   < > 26 27 27 25 26   GLUCOSE 150*   < > 113* 141* 124* 140* 153*  BUN 135*   < > 130* 83* 125* 74* 128*  CREATININE 1.92*   < > 1.83* 1.54* 1.91* 1.31* 1.94*  CALCIUM 8.3*   < > 8.4* 8.4* 8.3* 7.9* 8.5*  MG  --   --   --  2.1 2.1 1.9  --   PHOS 3.1  --  1.9*  --  2.4*  --   --    < > = values in this interval not displayed.    ABG: Recent Labs  Lab 01/19/20 1133 01/20/20 1023 01/21/20 0916  PHART 7.156* 7.194* 7.316*  PCO2ART 82.8* 71.8* 57.1*  PO2ART 176* 138* 64.2*  HCO3 28.2* 26.8 28.1*  O2SAT 99.7 99.1 94.9     Liver Function Tests: Recent Labs  Lab 01/16/20 0755 01/18/20 1105 01/20/20 0500  ALBUMIN 2.0* 1.9* 1.7*   No results for input(s): LIPASE, AMYLASE in the last 168 hours. No results for input(s): AMMONIA in the last 168 hours.  CBC: Recent Labs  Lab 01/18/20 1105 01/19/20 0520 01/20/20 1013 01/21/20 1846 01/22/20 0814  WBC 4.9 7.2 4.1 5.5 5.5  HGB 7.5* 8.0* 6.6* 7.3*  7.3* 7.4*  HCT 24.3* 26.4* 22.9* 23.4*  22.4* 25.4*  MCV 100.4* 102.7* 104.1* 97.1 101.6*  PLT 24* 28* 21* 25* 23*    Cardiac Enzymes: No results for input(s): CKTOTAL, CKMB, CKMBINDEX, TROPONINI in the last 168 hours.  BNP (last 3 results) No results for input(s): BNP in the last 8760 hours.  ProBNP (last 3 results) No results for input(s): PROBNP in the last 8760 hours.  Radiological Exams: No results found.  Assessment/Plan Active Problems:   Acute on chronic respiratory failure with hypoxia (HCC)   COVID-19 virus infection   Acute respiratory distress syndrome (ARDS) due to COVID-19 virus (HCC)   Pneumonia due to COVID-19 virus   Acute renal failure due to tubular necrosis (HCC)   1. Acute on chronic  respiratory failure hypoxia we will continue with full support on the ventilator with pressure control mode tidal volumes are running 100 IP 32 PEEP 7 2. COVID-19 virus infection in recovery phase very slow to improve 3. ARDS secondary to COVID-19 again slow to improve 4. Acute renal failure being followed along with nephrology 5. Pneumonia due to COVID-19 treated clinically improved   I have personally seen and evaluated the patient, evaluated laboratory and imaging results, formulated the assessment and plan and placed orders. The Patient requires high complexity decision making with multiple systems involvement.  Rounds were done with the Respiratory Therapy Director and Staff therapists and discussed with nursing staff also.  Yevonne Pax, MD [facility] Pulmonary Critical Care  Medicine Sleep Medicine

## 2020-01-23 ENCOUNTER — Other Ambulatory Visit (HOSPITAL_COMMUNITY): Payer: BLUE CROSS/BLUE SHIELD

## 2020-01-23 DIAGNOSIS — N170 Acute kidney failure with tubular necrosis: Secondary | ICD-10-CM | POA: Diagnosis not present

## 2020-01-23 DIAGNOSIS — U071 COVID-19: Secondary | ICD-10-CM | POA: Diagnosis not present

## 2020-01-23 DIAGNOSIS — J9621 Acute and chronic respiratory failure with hypoxia: Secondary | ICD-10-CM | POA: Diagnosis not present

## 2020-01-23 DIAGNOSIS — J80 Acute respiratory distress syndrome: Secondary | ICD-10-CM | POA: Diagnosis not present

## 2020-01-23 LAB — CBC
HCT: 21.4 % — ABNORMAL LOW (ref 39.0–52.0)
Hemoglobin: 6.5 g/dL — CL (ref 13.0–17.0)
MCH: 30.5 pg (ref 26.0–34.0)
MCHC: 30.4 g/dL (ref 30.0–36.0)
MCV: 100.5 fL — ABNORMAL HIGH (ref 80.0–100.0)
Platelets: 19 10*3/uL — CL (ref 150–400)
RBC: 2.13 MIL/uL — ABNORMAL LOW (ref 4.22–5.81)
RDW: 21.2 % — ABNORMAL HIGH (ref 11.5–15.5)
WBC: 5.4 10*3/uL (ref 4.0–10.5)
nRBC: 1.1 % — ABNORMAL HIGH (ref 0.0–0.2)

## 2020-01-23 LAB — RENAL FUNCTION PANEL
Albumin: 1.6 g/dL — ABNORMAL LOW (ref 3.5–5.0)
Anion gap: 11 (ref 5–15)
BUN: 152 mg/dL — ABNORMAL HIGH (ref 6–20)
CO2: 25 mmol/L (ref 22–32)
Calcium: 8.7 mg/dL — ABNORMAL LOW (ref 8.9–10.3)
Chloride: 93 mmol/L — ABNORMAL LOW (ref 98–111)
Creatinine, Ser: 2.14 mg/dL — ABNORMAL HIGH (ref 0.61–1.24)
GFR calc Af Amer: 39 mL/min — ABNORMAL LOW (ref >60)
GFR calc non Af Amer: 33 mL/min — ABNORMAL LOW (ref >60)
Glucose, Bld: 125 mg/dL — ABNORMAL HIGH (ref 70–99)
Phosphorus: 3.5 mg/dL (ref 2.5–4.6)
Potassium: 4.5 mmol/L (ref 3.5–5.1)
Sodium: 129 mmol/L — ABNORMAL LOW (ref 135–145)

## 2020-01-23 LAB — BPAM PLATELET PHERESIS
Blood Product Expiration Date: 202109062359
ISSUE DATE / TIME: 202109051221
Unit Type and Rh: 9500

## 2020-01-23 LAB — PREPARE PLATELET PHERESIS: Unit division: 0

## 2020-01-23 NOTE — Progress Notes (Signed)
[facility]  ROUNDING NOTE   Subjective:  Patient seen and evaluated during dialysis treatment today.  Currently tolerating well. Still on the ventilator. FiO2 of 60% with PEEP of 9.   Objective:  Vital signs in last 24 hours:  Temperature 98.3 pulse 101 respirations 21 blood pressure 127/63  Physical Exam: General: Critically ill-appearing  Head: Normocephalic, atraumatic. Moist oral mucosal membranes  Eyes: Anicteric  Neck: Tracheostomy in place  Lungs:  Bilateral rhonchi, vent assisted  Heart: S1S2 no rubs  Abdomen:  Soft, nontender, bowel sounds present  Extremities: 2+ generalized edema  Neurologic: Awake, alert  Skin: Upper extremity ecchymoses       Basic Metabolic Panel: Recent Labs  Lab 01/18/20 1105 01/18/20 1105 01/19/20 0520 01/19/20 0520 01/20/20 0500 01/20/20 0500 01/20/20 2200 01/22/20 0814 01/23/20 0633  NA 130*   < > 134*  --  129*  --  134* 129* 129*  K 4.3   < > 4.3  --  3.8  --  3.8 4.4 4.5  CL 94*   < > 97*  --  93*  --  97* 92* 93*  CO2 26   < > 27  --  27  --  25 26 25   GLUCOSE 113*   < > 141*  --  124*  --  140* 153* 125*  BUN 130*   < > 83*  --  125*  --  74* 128* 152*  CREATININE 1.83*   < > 1.54*  --  1.91*  --  1.31* 1.94* 2.14*  CALCIUM 8.4*   < > 8.4*   < > 8.3*   < > 7.9* 8.5* 8.7*  MG  --   --  2.1  --  2.1  --  1.9  --   --   PHOS 1.9*  --   --   --  2.4*  --   --   --  3.5   < > = values in this interval not displayed.    Liver Function Tests: Recent Labs  Lab 01/18/20 1105 01/20/20 0500 01/23/20 0633  ALBUMIN 1.9* 1.7* 1.6*   No results for input(s): LIPASE, AMYLASE in the last 168 hours. No results for input(s): AMMONIA in the last 168 hours.  CBC: Recent Labs  Lab 01/19/20 0520 01/20/20 1013 01/21/20 1846 01/22/20 0814 01/22/20 1929  WBC 7.2 4.1 5.5 5.5 4.3  HGB 8.0* 6.6* 7.3*  7.3* 7.4* 6.5*  HCT 26.4* 22.9* 23.4*  22.4* 25.4* 22.1*  MCV 102.7* 104.1* 97.1 101.6* 100.0  PLT 28* 21* 25* 23*  22*    Cardiac Enzymes: No results for input(s): CKTOTAL, CKMB, CKMBINDEX, TROPONINI in the last 168 hours.  BNP: Invalid input(s): POCBNP  CBG: No results for input(s): GLUCAP in the last 168 hours.  Microbiology: Results for orders placed or performed during the hospital encounter of 12/12/2019  Culture, respiratory     Status: None   Collection Time: 11/29/19  9:21 AM   Specimen: Tracheal Aspirate; Respiratory  Result Value Ref Range Status   Specimen Description TRACHEAL ASPIRATE  Final   Special Requests NONE  Final   Gram Stain   Final    FEW WBC PRESENT, PREDOMINANTLY PMN ABUNDANT GRAM POSITIVE COCCI ABUNDANT GRAM POSITIVE RODS MODERATE GRAM NEGATIVE RODS    Culture   Final    RARE Consistent with normal respiratory flora. Performed at [facility], [address] [place], [place]    Report Status 12/01/2019 FINAL  Final  Culture, respiratory (non-expectorated)     Status: None   Collection Time: 12/03/19  1:45 PM   Specimen: Tracheal Aspirate; Respiratory  Result Value Ref Range Status   Specimen Description TRACHEAL ASPIRATE  Final   Special Requests NONE  Final   Gram Stain   Final    RARE WBC PRESENT,BOTH PMN AND MONONUCLEAR RARE GRAM POSITIVE COCCI RARE YEAST RARE GRAM POSITIVE RODS Performed at [facility], [address], [place], [place] [address]    Culture FEW CANDIDA TROPICALIS  Final   Report Status 12/06/2019 FINAL  Final  Culture, respiratory (non-expectorated)     Status: None   Collection Time: 12/04/19  1:53 PM   Specimen: Tracheal Aspirate; Respiratory  Result Value Ref Range Status   Specimen Description TRACHEAL ASPIRATE  Final   Special Requests NONE  Final   Gram Stain   Final    RARE WBC PRESENT, PREDOMINANTLY PMN FEW GRAM POSITIVE COCCI IN PAIRS IN CLUSTERS RARE GRAM NEGATIVE RODS    Culture   Final    Consistent with normal respiratory flora. Performed at [facility], [address],  [place], [place] [address]    Report Status 12/06/2019 FINAL  Final  Culture, blood (routine x 2)     Status: None   Collection Time: 12/04/19  2:26 PM   Specimen: BLOOD LEFT HAND  Result Value Ref Range Status   Specimen Description BLOOD LEFT HAND  Final   Special Requests   Final    BOTTLES DRAWN AEROBIC ONLY Blood Culture results may not be optimal due to an inadequate volume of blood received in culture bottles   Culture   Final    NO GROWTH 5 DAYS Performed at [facility], [address], [place], [place] [address]    Report Status 12/09/2019 FINAL  Final  Culture, blood (routine x 2)     Status: None   Collection Time: 12/04/19  2:26 PM   Specimen: BLOOD LEFT HAND  Result Value Ref Range Status   Specimen Description BLOOD LEFT HAND  Final   Special Requests   Final    BOTTLES DRAWN AEROBIC ONLY Blood Culture results may not be optimal due to an inadequate volume of blood received in culture bottles   Culture   Final    NO GROWTH 5 DAYS Performed at [facility], [address] [place], [place] [address]    Report Status 12/09/2019 FINAL  Final  Culture, respiratory     Status: None   Collection Time: 12/13/19  9:20 AM   Specimen: Tracheal Aspirate; Respiratory  Result Value Ref Range Status   Specimen Description TRACHEAL ASPIRATE  Final   Special Requests NONE  Final   Gram Stain RARE WBC PRESENT, PREDOMINANTLY PMN RARE YEAST   Final   Culture   Final    RARE Consistent with normal respiratory flora. Performed at [facility], [address], [place], [place] [address]    Report Status 12/15/2019 FINAL  Final  Culture, blood (routine x 2)     Status: None   Collection Time: 12/17/19  4:46 PM   Specimen: BLOOD LEFT HAND  Result Value Ref Range Status   Specimen Description BLOOD LEFT HAND  Final   Special Requests   Final    BOTTLES DRAWN AEROBIC AND ANAEROBIC Blood Culture adequate volume   Culture   Final    NO GROWTH 5 DAYS Performed at [facility], [address], [place], [place] [address]    Report Status 12/22/2019 FINAL  Final  Culture, blood (routine x 2)     Status: None   Collection Time: 12/17/19  5:34 PM   Specimen: BLOOD LEFT HAND  Result Value Ref Range Status   Specimen Description BLOOD LEFT HAND  Final   Special Requests   Final    BOTTLES DRAWN AEROBIC AND ANAEROBIC Blood Culture adequate volume   Culture   Final    NO GROWTH 5 DAYS Performed at [facility], [address] [place], [place] [address]    Report Status 12/22/2019 FINAL  Final  Culture, respiratory     Status: None   Collection Time: 12/17/19  5:40 PM   Specimen: Tracheal Aspirate; Respiratory  Result Value Ref Range Status   Specimen Description TRACHEAL ASPIRATE  Final   Special Requests NONE  Final   Gram Stain   Final    FEW WBC PRESENT, PREDOMINANTLY PMN FEW YEAST Performed at [facility], [address] [place], [place] [address]    Culture FEW CANDIDA TROPICALIS  Final   Report Status 12/19/2019 FINAL  Final  Culture, respiratory (non-expectorated)     Status: None   Collection Time: 12/24/19 10:25 AM   Specimen: Tracheal Aspirate; Respiratory  Result Value Ref Range Status   Specimen Description TRACHEAL ASPIRATE  Final   Special Requests NONE  Final   Gram Stain   Final    NO WBC SEEN RARE BUDDING YEAST SEEN Performed at [facility], [address], [place], [place] [address]    Culture RARE CANDIDA TROPICALIS  Final   Report Status 12/26/2019 FINAL  Final    Coagulation Studies: No results for input(s): LABPROT, INR in the last 72 hours.  Urinalysis: No results for input(s): COLORURINE, LABSPEC, PHURINE, GLUCOSEU, HGBUR, BILIRUBINUR, KETONESUR, PROTEINUR, UROBILINOGEN, NITRITE, LEUKOCYTESUR in the last 72 hours.  Invalid input(s): APPERANCEUR    Imaging: No results found.   Medications:       Assessment/ Plan:  54 y.o. male a PMHx of recent COVID-19 infection with resultant  respiratory failure status post tracheostomy placement, hypertension, hyperlipidemia, tobacco abuse, ARDS, PEG tube placement who was admitted to [facility] on 11/18/2019 for ongoing treatment of acute respiratory failure.  1.  Acute kidney injury suspect most likely underlying ATN.  Patient with very significant azotemia at the initiation of dialysis treatments. -Patient seen and evaluated during dialysis treatment.  We will plan for dialysis Monday through Friday this week with ultrafiltration target of 2.5 kg.  Add albumin to his dialysis treatments as well.  2.  Hyperkalemia.  Resolved, potassium 4.5.  3.  Acute respiratory failure.  FiO2 requirement increased to 68% with PEEP of 9.  We will attempt to aid treatments with ongoing ultrafiltration.  4.  Hyponatremia.  Serum sodium did drop to 129.  Continue to monitor closely.   LOS: 0 Prisila Dlouhy 9/6/202111:21 AM

## 2020-01-23 NOTE — Progress Notes (Signed)
Pulmonary Critical Care Medicine [facility] [place]   PULMONARY CRITICAL CARE SERVICE  PROGRESS NOTE  Date of Service: 01/23/2020  Danny Leach  DXA:[id number]  DOB: 1965/06/23   DOA: 12/11/2019  Referring Physician: Carron Curie, MD  HPI: Danny Leach is a 54 y.o. male seen for follow up of Acute on Chronic Respiratory Failure.  Patient at this time is on full support on pressure control mode has not been tolerating weaning today  Medications: Reviewed on Rounds  Physical Exam:  Vitals: Temperature 98.3 pulse 101 respiratory rate 40 blood pressure is 127/63 saturations 92%  Ventilator Settings on pressure assist control FiO2 65% PEEP 9 PIP 32  . General: Comfortable at this time . Eyes: Grossly normal lids, irises & conjunctiva . ENT: grossly tongue is normal . Neck: no obvious mass . Cardiovascular: S1 S2 normal no gallop . Respiratory: No rhonchi very coarse breath sounds . Abdomen: soft . Skin: no rash seen on limited exam . Musculoskeletal: not rigid . Psychiatric:unable to assess . Neurologic: no seizure no involuntary movements         Lab Data:   Basic Metabolic Panel: Recent Labs  Lab 01/18/20 1105 01/18/20 1105 01/19/20 0520 01/20/20 0500 01/20/20 2200 01/22/20 0814 01/23/20 0633  NA 130*   < > 134* 129* 134* 129* 129*  K 4.3   < > 4.3 3.8 3.8 4.4 4.5  CL 94*   < > 97* 93* 97* 92* 93*  CO2 26   < > 27 27 25 26 25   GLUCOSE 113*   < > 141* 124* 140* 153* 125*  BUN 130*   < > 83* 125* 74* 128* 152*  CREATININE 1.83*   < > 1.54* 1.91* 1.31* 1.94* 2.14*  CALCIUM 8.4*   < > 8.4* 8.3* 7.9* 8.5* 8.7*  MG  --   --  2.1 2.1 1.9  --   --   PHOS 1.9*  --   --  2.4*  --   --  3.5   < > = values in this interval not displayed.    ABG: Recent Labs  Lab 01/19/20 1133 01/20/20 1023 01/21/20 0916  PHART 7.156* 7.194* 7.316*  PCO2ART 82.8* 71.8* 57.1*  PO2ART 176* 138* 64.2*  HCO3 28.2* 26.8 28.1*  O2SAT 99.7 99.1 94.9    Liver  Function Tests: Recent Labs  Lab 01/18/20 1105 01/20/20 0500 01/23/20 0633  ALBUMIN 1.9* 1.7* 1.6*   No results for input(s): LIPASE, AMYLASE in the last 168 hours. No results for input(s): AMMONIA in the last 168 hours.  CBC: Recent Labs  Lab 01/19/20 0520 01/20/20 1013 01/21/20 1846 01/22/20 0814 01/22/20 1929  WBC 7.2 4.1 5.5 5.5 4.3  HGB 8.0* 6.6* 7.3*  7.3* 7.4* 6.5*  HCT 26.4* 22.9* 23.4*  22.4* 25.4* 22.1*  MCV 102.7* 104.1* 97.1 101.6* 100.0  PLT 28* 21* 25* 23* 22*    Cardiac Enzymes: No results for input(s): CKTOTAL, CKMB, CKMBINDEX, TROPONINI in the last 168 hours.  BNP (last 3 results) No results for input(s): BNP in the last 8760 hours.  ProBNP (last 3 results) No results for input(s): PROBNP in the last 8760 hours.  Radiological Exams: No results found.  Assessment/Plan Active Problems:   Acute on chronic respiratory failure with hypoxia (HCC)   COVID-19 virus infection   Acute respiratory distress syndrome (ARDS) due to COVID-19 virus (HCC)   Pneumonia due to COVID-19 virus   Acute renal failure due to tubular necrosis (HCC)  1. Acute on chronic respiratory failure hypoxia we will continue with full support on pressure control titrate oxygen continue pulmonary toilet. 2. COVID-19 virus infection at baseline we will continue to follow 3. ARDS slow improvement 4. Pneumonia due to COVID-19 still residual damage changes noted on x-rays 5. Acute renal failure being followed by nephrology   I have personally seen and evaluated the patient, evaluated laboratory and imaging results, formulated the assessment and plan and placed orders. The Patient requires high complexity decision making with multiple systems involvement.  Rounds were done with the Respiratory Therapy Director and Staff therapists and discussed with nursing staff also.  Yevonne Pax, MD [facility] Pulmonary Critical Care Medicine Sleep Medicine

## 2020-01-24 DIAGNOSIS — J9621 Acute and chronic respiratory failure with hypoxia: Secondary | ICD-10-CM | POA: Diagnosis not present

## 2020-01-24 DIAGNOSIS — J80 Acute respiratory distress syndrome: Secondary | ICD-10-CM | POA: Diagnosis not present

## 2020-01-24 DIAGNOSIS — N170 Acute kidney failure with tubular necrosis: Secondary | ICD-10-CM | POA: Diagnosis not present

## 2020-01-24 DIAGNOSIS — U071 COVID-19: Secondary | ICD-10-CM | POA: Diagnosis not present

## 2020-01-24 LAB — BLOOD CULTURE ID PANEL (REFLEXED) - BCID2
A.calcoaceticus-baumannii: NOT DETECTED
Bacteroides fragilis: NOT DETECTED
Candida albicans: NOT DETECTED
Candida auris: NOT DETECTED
Candida glabrata: NOT DETECTED
Candida krusei: NOT DETECTED
Candida parapsilosis: NOT DETECTED
Candida tropicalis: NOT DETECTED
Cryptococcus neoformans/gattii: NOT DETECTED
Enterobacter cloacae complex: NOT DETECTED
Enterobacterales: NOT DETECTED
Enterococcus Faecium: NOT DETECTED
Enterococcus faecalis: DETECTED — AB
Escherichia coli: NOT DETECTED
Haemophilus influenzae: NOT DETECTED
Klebsiella aerogenes: NOT DETECTED
Klebsiella oxytoca: NOT DETECTED
Klebsiella pneumoniae: NOT DETECTED
Listeria monocytogenes: NOT DETECTED
Neisseria meningitidis: NOT DETECTED
Proteus species: NOT DETECTED
Pseudomonas aeruginosa: NOT DETECTED
Salmonella species: NOT DETECTED
Serratia marcescens: NOT DETECTED
Staphylococcus aureus (BCID): NOT DETECTED
Staphylococcus epidermidis: NOT DETECTED
Staphylococcus lugdunensis: NOT DETECTED
Staphylococcus species: DETECTED — AB
Stenotrophomonas maltophilia: NOT DETECTED
Streptococcus agalactiae: NOT DETECTED
Streptococcus pneumoniae: NOT DETECTED
Streptococcus pyogenes: NOT DETECTED
Streptococcus species: NOT DETECTED
Vancomycin resistance: DETECTED — AB

## 2020-01-24 LAB — BPAM RBC
Blood Product Expiration Date: 202109192359
Blood Product Expiration Date: 202109192359
ISSUE DATE / TIME: 202109032114
ISSUE DATE / TIME: 202109061220
Unit Type and Rh: 7300
Unit Type and Rh: 7300

## 2020-01-24 LAB — CBC
HCT: 21.5 % — ABNORMAL LOW (ref 39.0–52.0)
HCT: 23.1 % — ABNORMAL LOW (ref 39.0–52.0)
Hemoglobin: 6.5 g/dL — CL (ref 13.0–17.0)
Hemoglobin: 6.9 g/dL — CL (ref 13.0–17.0)
MCH: 30.4 pg (ref 26.0–34.0)
MCH: 29.6 pg (ref 26.0–34.0)
MCHC: 30.2 g/dL (ref 30.0–36.0)
MCHC: 29.9 g/dL — ABNORMAL LOW (ref 30.0–36.0)
MCV: 100.5 fL — ABNORMAL HIGH (ref 80.0–100.0)
MCV: 99.1 fL (ref 80.0–100.0)
Platelets: 21 10*3/uL — CL (ref 150–400)
Platelets: 21 10*3/uL — CL (ref 150–400)
RBC: 2.14 MIL/uL — ABNORMAL LOW (ref 4.22–5.81)
RBC: 2.33 MIL/uL — ABNORMAL LOW (ref 4.22–5.81)
RDW: 21.4 % — ABNORMAL HIGH (ref 11.5–15.5)
RDW: 20.1 % — ABNORMAL HIGH (ref 11.5–15.5)
WBC: 4.6 10*3/uL (ref 4.0–10.5)
WBC: 4.7 10*3/uL (ref 4.0–10.5)
nRBC: 1.8 % — ABNORMAL HIGH (ref 0.0–0.2)
nRBC: 3.6 % — ABNORMAL HIGH (ref 0.0–0.2)

## 2020-01-24 LAB — RENAL FUNCTION PANEL
Albumin: 1.4 g/dL — ABNORMAL LOW (ref 3.5–5.0)
Anion gap: 12 (ref 5–15)
BUN: 94 mg/dL — ABNORMAL HIGH (ref 6–20)
CO2: 26 mmol/L (ref 22–32)
Calcium: 8.2 mg/dL — ABNORMAL LOW (ref 8.9–10.3)
Chloride: 95 mmol/L — ABNORMAL LOW (ref 98–111)
Creatinine, Ser: 1.59 mg/dL — ABNORMAL HIGH (ref 0.61–1.24)
GFR calc Af Amer: 55 mL/min — ABNORMAL LOW (ref >60)
GFR calc non Af Amer: 48 mL/min — ABNORMAL LOW (ref >60)
Glucose, Bld: 170 mg/dL — ABNORMAL HIGH (ref 70–99)
Phosphorus: 2.7 mg/dL (ref 2.5–4.6)
Potassium: 3.6 mmol/L (ref 3.5–5.1)
Sodium: 133 mmol/L — ABNORMAL LOW (ref 135–145)

## 2020-01-24 LAB — TYPE AND SCREEN
ABO/RH(D): B POS
Antibody Screen: NEGATIVE
Unit division: 0
Unit division: 0

## 2020-01-24 LAB — BPAM PLATELET PHERESIS
Blood Product Expiration Date: 202109082359
ISSUE DATE / TIME: 202109061325
Unit Type and Rh: 5100

## 2020-01-24 LAB — PREPARE PLATELET PHERESIS: Unit division: 0

## 2020-01-24 LAB — CK: Total CK: 34 U/L — ABNORMAL LOW (ref 49–397)

## 2020-01-24 LAB — PREPARE RBC (CROSSMATCH)

## 2020-01-24 NOTE — Progress Notes (Addendum)
Pulmonary Critical Care Medicine [facility] [place]   PULMONARY CRITICAL CARE SERVICE  PROGRESS NOTE  Date of Service: 01/24/2020  Danny Leach  WCH:[id number]  DOB: 01-Feb-1966   DOA: 11/26/2019  Referring Physician: Carron Curie, MD  HPI: Danny Leach is a 54 y.o. male seen for follow up of Acute on Chronic Respiratory Failure. Patient remains on full support at this time, unable to wean currently.  No distress noted.   Medications: Reviewed on Rounds  Physical Exam:  Vitals: pulse 87, resp 32, bp 160/82, o2 sat 100%, temp 99.1.  Ventilator Settings ACPC rate of 35, IP 32, peep 9, fio2 65%.   . General: Comfortable at this time . Eyes: Grossly normal lids, irises & conjunctiva . ENT: grossly tongue is normal . Neck: no obvious mass . Cardiovascular: S1 S2 normal no gallop . Respiratory: no rales or ronchi noted . Abdomen: soft . Skin: no rash seen on limited exam . Musculoskeletal: not rigid . Psychiatric:unable to assess . Neurologic: no seizure no involuntary movements         Lab Data:   Basic Metabolic Panel: Recent Labs  Lab 01/18/20 1105 01/18/20 1105 01/19/20 0520 01/19/20 0520 01/20/20 0500 01/20/20 2200 01/22/20 0814 01/23/20 0633 01/24/20 0757  NA 130*   < > 134*   < > 129* 134* 129* 129* 133*  K 4.3   < > 4.3   < > 3.8 3.8 4.4 4.5 3.6  CL 94*   < > 97*   < > 93* 97* 92* 93* 95*  CO2 26   < > 27   < > 27 25 26 25 26   GLUCOSE 113*   < > 141*   < > 124* 140* 153* 125* 170*  BUN 130*   < > 83*   < > 125* 74* 128* 152* 94*  CREATININE 1.83*   < > 1.54*   < > 1.91* 1.31* 1.94* 2.14* 1.59*  CALCIUM 8.4*   < > 8.4*   < > 8.3* 7.9* 8.5* 8.7* 8.2*  MG  --   --  2.1  --  2.1 1.9  --   --   --   PHOS 1.9*  --   --   --  2.4*  --   --  3.5 2.7   < > = values in this interval not displayed.    ABG: Recent Labs  Lab 01/19/20 1133 01/20/20 1023 01/21/20 0916  PHART 7.156* 7.194* 7.316*  PCO2ART 82.8* 71.8* 57.1*  PO2ART 176* 138*  64.2*  HCO3 28.2* 26.8 28.1*  O2SAT 99.7 99.1 94.9    Liver Function Tests: Recent Labs  Lab 01/18/20 1105 01/20/20 0500 01/23/20 0633 01/24/20 0757  ALBUMIN 1.9* 1.7* 1.6* 1.4*   No results for input(s): LIPASE, AMYLASE in the last 168 hours. No results for input(s): AMMONIA in the last 168 hours.  CBC: Recent Labs  Lab 01/21/20 1846 01/22/20 0814 01/22/20 1929 01/23/20 1030 01/24/20 0757  WBC 5.5 5.5 4.3 5.4 4.6  HGB 7.3*  7.3* 7.4* 6.5* 6.5* 6.5*  HCT 23.4*  22.4* 25.4* 22.1* 21.4* 21.5*  MCV 97.1 101.6* 100.0 100.5* 100.5*  PLT 25* 23* 22* 19* 21*    Cardiac Enzymes: No results for input(s): CKTOTAL, CKMB, CKMBINDEX, TROPONINI in the last 168 hours.  BNP (last 3 results) No results for input(s): BNP in the last 8760 hours.  ProBNP (last 3 results) No results for input(s): PROBNP in the last 8760 hours.  Radiological Exams:  DG CHEST PORT 1 VIEW  Result Date: 01/23/2020 CLINICAL DATA:  Pneumonia. EXAM: PORTABLE CHEST 1 VIEW COMPARISON:  01/16/2020 FINDINGS: Stable mildly enlarged cardiac silhouette. Stable tracheostomy tube. Right jugular catheter tip in the upper right atrium near the superior cavoatrial junction. Chronic interstitial prominence throughout both lungs is again demonstrated with interval mild superimposed airspace opacity in the right mid lung zone both lung bases, greater on the left. Probable small right pleural effusion. Unremarkable bones. IMPRESSION: 1. Interval mild bilateral pneumonia, greater on the left. 2. Probable small right pleural effusion. 3. Stable mild cardiomegaly and chronic interstitial lung disease. Electronically Signed   By: Beckie Salts M.D.   On: 01/23/2020 16:56    Assessment/Plan Active Problems:   Acute on chronic respiratory failure with hypoxia (HCC)   COVID-19 virus infection   Acute respiratory distress syndrome (ARDS) due to COVID-19 virus (HCC)   Pneumonia due to COVID-19 virus   Acute renal failure due to  tubular necrosis (HCC)   1. Acute on chronic respiratory failure hypoxia we will continue with full support on pressure control.  continue to attempt weaning as tolerated.  Continue supportive measures and pulm toilet.  2. COVID-19 virus infection at baseline we will continue to follow 3. ARDS slow improvement 4. Pneumonia due to COVID-19 still residual damage changes noted on x-rays 5. Acute renal failure being followed by nephrology   I have personally seen and evaluated the patient, evaluated laboratory and imaging results, formulated the assessment and plan and placed orders. The Patient requires high complexity decision making with multiple systems involvement.  Rounds were done with the Respiratory Therapy Director and Staff therapists and discussed with nursing staff also.  Yevonne Pax, MD [facility] Pulmonary Critical Care Medicine Sleep Medicine

## 2020-01-25 DIAGNOSIS — J80 Acute respiratory distress syndrome: Secondary | ICD-10-CM | POA: Diagnosis not present

## 2020-01-25 DIAGNOSIS — N170 Acute kidney failure with tubular necrosis: Secondary | ICD-10-CM | POA: Diagnosis not present

## 2020-01-25 DIAGNOSIS — U071 COVID-19: Secondary | ICD-10-CM | POA: Diagnosis not present

## 2020-01-25 DIAGNOSIS — J9621 Acute and chronic respiratory failure with hypoxia: Secondary | ICD-10-CM | POA: Diagnosis not present

## 2020-01-25 LAB — BPAM PLATELET PHERESIS
Blood Product Expiration Date: 202109082359
Blood Product Expiration Date: 202109092359
ISSUE DATE / TIME: 202109071511
ISSUE DATE / TIME: 202109071553
Unit Type and Rh: 5100
Unit Type and Rh: 6200

## 2020-01-25 LAB — CBC
HCT: 21.9 % — ABNORMAL LOW (ref 39.0–52.0)
HCT: 24.4 % — ABNORMAL LOW (ref 39.0–52.0)
Hemoglobin: 6.5 g/dL — CL (ref 13.0–17.0)
Hemoglobin: 7.3 g/dL — ABNORMAL LOW (ref 13.0–17.0)
MCH: 29.5 pg (ref 26.0–34.0)
MCH: 29.9 pg (ref 26.0–34.0)
MCHC: 29.7 g/dL — ABNORMAL LOW (ref 30.0–36.0)
MCHC: 29.9 g/dL — ABNORMAL LOW (ref 30.0–36.0)
MCV: 100 fL (ref 80.0–100.0)
MCV: 99.5 fL (ref 80.0–100.0)
Platelets: 15 10*3/uL — CL (ref 150–400)
Platelets: 19 10*3/uL — CL (ref 150–400)
RBC: 2.2 MIL/uL — ABNORMAL LOW (ref 4.22–5.81)
RBC: 2.44 MIL/uL — ABNORMAL LOW (ref 4.22–5.81)
RDW: 20.2 % — ABNORMAL HIGH (ref 11.5–15.5)
RDW: 20.3 % — ABNORMAL HIGH (ref 11.5–15.5)
WBC: 4 10*3/uL (ref 4.0–10.5)
WBC: 6.1 10*3/uL (ref 4.0–10.5)
nRBC: 3 % — ABNORMAL HIGH (ref 0.0–0.2)
nRBC: 3 % — ABNORMAL HIGH (ref 0.0–0.2)

## 2020-01-25 LAB — RENAL FUNCTION PANEL
Albumin: 1.7 g/dL — ABNORMAL LOW (ref 3.5–5.0)
Anion gap: 9 (ref 5–15)
BUN: 85 mg/dL — ABNORMAL HIGH (ref 6–20)
CO2: 28 mmol/L (ref 22–32)
Calcium: 8.3 mg/dL — ABNORMAL LOW (ref 8.9–10.3)
Chloride: 96 mmol/L — ABNORMAL LOW (ref 98–111)
Creatinine, Ser: 1.38 mg/dL — ABNORMAL HIGH (ref 0.61–1.24)
GFR calc Af Amer: >60 mL/min (ref >60)
GFR calc non Af Amer: 57 mL/min — ABNORMAL LOW (ref >60)
Glucose, Bld: 156 mg/dL — ABNORMAL HIGH (ref 70–99)
Phosphorus: 3.1 mg/dL (ref 2.5–4.6)
Potassium: 4.1 mmol/L (ref 3.5–5.1)
Sodium: 133 mmol/L — ABNORMAL LOW (ref 135–145)

## 2020-01-25 LAB — BPAM RBC
Blood Product Expiration Date: 202109142359
ISSUE DATE / TIME: 202109071104
Unit Type and Rh: 7300

## 2020-01-25 LAB — TYPE AND SCREEN
ABO/RH(D): B POS
Antibody Screen: NEGATIVE
Unit division: 0

## 2020-01-25 LAB — PREPARE PLATELET PHERESIS
Unit division: 0
Unit division: 0

## 2020-01-26 LAB — CULTURE, BLOOD (ROUTINE X 2)

## 2020-01-27 LAB — CULTURE, BLOOD (ROUTINE X 2): Special Requests: ADEQUATE

## 2020-02-17 NOTE — Progress Notes (Signed)
[facility]  ROUNDING NOTE   Subjective:  Patient seen and evaluated at bedside. He is to undergo dialysis Monday through Friday.    Objective:  Vital signs in last 24 hours:  Temperature 97.1 pulse 96 respirations 33 blood pressure 100/53  Physical Exam: General: Critically ill-appearing  Head: Normocephalic, atraumatic. Moist oral mucosal membranes  Eyes: Anicteric  Neck: Tracheostomy in place  Lungs:  Bilateral rhonchi, vent assisted  Heart: S1S2 no rubs  Abdomen:  Soft, nontender, bowel sounds present  Extremities: 2+ generalized edema  Neurologic: Awake, alert  Skin: Upper extremity ecchymoses       Basic Metabolic Panel: Recent Labs  Lab 01/18/20 1105 01/18/20 1105 01/19/20 0520 01/19/20 0520 01/20/20 0500 01/20/20 0500 01/20/20 2200 01/20/20 2200 01/22/20 0814 01/23/20 0633 01/24/20 0757  NA 130*   < > 134*   < > 129*  --  134*  --  129* 129* 133*  K 4.3   < > 4.3   < > 3.8  --  3.8  --  4.4 4.5 3.6  CL 94*   < > 97*   < > 93*  --  97*  --  92* 93* 95*  CO2 26   < > 27   < > 27  --  25  --  GLUCOSE 113*   < > 141*   < > 124*  --  140*  --  153* 125* 170*  BUN 130*   < > 83*   < > 125*  --  74*  --  128* 152* 94*  CREATININE 1.83*   < > 1.54*   < > 1.91*  --  1.31*  --  1.94* 2.14* 1.59*  CALCIUM 8.4*   < > 8.4*   < > 8.3*   < > 7.9*   < > 8.5* 8.7* 8.2*  MG  --   --  2.1  --  2.1  --  1.9  --   --   --   --   PHOS 1.9*  --   --   --  2.4*  --   --   --   --  3.5 2.7   < > = values in this interval not displayed.    Liver Function Tests: Recent Labs  Lab 01/18/20 1105 01/20/20 0500 01/23/20 0633 01/24/20 0757  ALBUMIN 1.9* 1.7* 1.6* 1.4*   No results for input(s): LIPASE, AMYLASE in the last 168 hours. No results for input(s): AMMONIA in the last 168 hours.  CBC: Recent Labs  Lab 01/22/20 1929 01/23/20 1030 01/24/20 0757 01/24/20 1811 02-11-2020 0044  WBC 4.3 5.4 4.6 4.7 6.1  HGB 6.5* 6.5* 6.5* 6.9* 7.3*  HCT  22.1* 21.4* 21.5* 23.1* 24.4*  MCV 100.0 100.5* 100.5* 99.1 100.0  PLT 22* 19* 21* 21* 19*    Cardiac Enzymes: Recent Labs  Lab 01/24/20 1811  CKTOTAL 34*    BNP: Invalid input(s): POCBNP  CBG: No results for input(s): GLUCAP in the last 168 hours.  Microbiology: Results for orders placed or performed during the hospital encounter of 12/05/2019  Culture, respiratory     Status: None   Collection Time: 11/29/19  9:21 AM   Specimen: Tracheal Aspirate; Respiratory  Result Value Ref Range Status   Specimen Description TRACHEAL ASPIRATE  Final   Special Requests NONE  Final   Gram Stain   Final    FEW WBC PRESENT, PREDOMINANTLY PMN ABUNDANT GRAM POSITIVE COCCI ABUNDANT GRAM  POSITIVE RODS MODERATE GRAM NEGATIVE RODS    Culture   Final    RARE Consistent with normal respiratory flora. Performed at [facility], [address], [place], [place] [address]    Report Status 12/01/2019 FINAL  Final  Culture, respiratory (non-expectorated)     Status: None   Collection Time: 12/03/19  1:45 PM   Specimen: Tracheal Aspirate; Respiratory  Result Value Ref Range Status   Specimen Description TRACHEAL ASPIRATE  Final   Special Requests NONE  Final   Gram Stain   Final    RARE WBC PRESENT,BOTH PMN AND MONONUCLEAR RARE GRAM POSITIVE COCCI RARE YEAST RARE GRAM POSITIVE RODS Performed at [facility], [address] [place], [place] [address]    Culture FEW CANDIDA TROPICALIS  Final   Report Status 12/06/2019 FINAL  Final  Culture, respiratory (non-expectorated)     Status: None   Collection Time: 12/04/19  1:53 PM   Specimen: Tracheal Aspirate; Respiratory  Result Value Ref Range Status   Specimen Description TRACHEAL ASPIRATE  Final   Special Requests NONE  Final   Gram Stain   Final    RARE WBC PRESENT, PREDOMINANTLY PMN FEW GRAM POSITIVE COCCI IN PAIRS IN CLUSTERS RARE GRAM NEGATIVE RODS    Culture   Final    Consistent with normal respiratory  flora. Performed at [facility], [address], [place], [place] [address]    Report Status 12/06/2019 FINAL  Final  Culture, blood (routine x 2)     Status: None   Collection Time: 12/04/19  2:26 PM   Specimen: BLOOD LEFT HAND  Result Value Ref Range Status   Specimen Description BLOOD LEFT HAND  Final   Special Requests   Final    BOTTLES DRAWN AEROBIC ONLY Blood Culture results may not be optimal due to an inadequate volume of blood received in culture bottles   Culture   Final    NO GROWTH 5 DAYS Performed at [facility], [address], [place], [place] [address]    Report Status 12/09/2019 FINAL  Final  Culture, blood (routine x 2)     Status: None   Collection Time: 12/04/19  2:26 PM   Specimen: BLOOD LEFT HAND  Result Value Ref Range Status   Specimen Description BLOOD LEFT HAND  Final   Special Requests   Final    BOTTLES DRAWN AEROBIC ONLY Blood Culture results may not be optimal due to an inadequate volume of blood received in culture bottles   Culture   Final    NO GROWTH 5 DAYS Performed at [facility], [address] [place], [place] [address]    Report Status 12/09/2019 FINAL  Final  Culture, respiratory     Status: None   Collection Time: 12/13/19  9:20 AM   Specimen: Tracheal Aspirate; Respiratory  Result Value Ref Range Status   Specimen Description TRACHEAL ASPIRATE  Final   Special Requests NONE  Final   Gram Stain RARE WBC PRESENT, PREDOMINANTLY PMN RARE YEAST   Final   Culture   Final    RARE Consistent with normal respiratory flora. Performed at [facility], [address], [place], [place] [address]    Report Status 12/15/2019 FINAL  Final  Culture, blood (routine x 2)     Status: None   Collection Time: 12/17/19  4:46 PM   Specimen: BLOOD LEFT HAND  Result Value Ref Range Status  Specimen Description BLOOD LEFT HAND  Final   Special Requests   Final    BOTTLES DRAWN AEROBIC AND ANAEROBIC Blood Culture adequate  volume   Culture   Final    NO GROWTH 5 DAYS Performed at [facility], [address], [place], [place] [address]    Report Status 12/22/2019 FINAL  Final  Culture, blood (routine x 2)     Status: None   Collection Time: 12/17/19  5:34 PM   Specimen: BLOOD LEFT HAND  Result Value Ref Range Status   Specimen Description BLOOD LEFT HAND  Final   Special Requests   Final    BOTTLES DRAWN AEROBIC AND ANAEROBIC Blood Culture adequate volume   Culture   Final    NO GROWTH 5 DAYS Performed at [facility], [address], [place], [place] [address]    Report Status 12/22/2019 FINAL  Final  Culture, respiratory     Status: None   Collection Time: 12/17/19  5:40 PM   Specimen: Tracheal Aspirate; Respiratory  Result Value Ref Range Status   Specimen Description TRACHEAL ASPIRATE  Final   Special Requests NONE  Final   Gram Stain   Final    FEW WBC PRESENT, PREDOMINANTLY PMN FEW YEAST Performed at [facility], [address], [place], [place] [address]    Culture FEW CANDIDA TROPICALIS  Final   Report Status 12/19/2019 FINAL  Final  Culture, respiratory (non-expectorated)     Status: None   Collection Time: 12/24/19 10:25 AM   Specimen: Tracheal Aspirate; Respiratory  Result Value Ref Range Status   Specimen Description TRACHEAL ASPIRATE  Final   Special Requests NONE  Final   Gram Stain   Final    NO WBC SEEN RARE BUDDING YEAST SEEN Performed at [facility], [address] [place], [place] [address]    Culture RARE CANDIDA TROPICALIS  Final   Report Status 12/26/2019 FINAL  Final  Culture, blood (routine x 2)     Status: None (Preliminary result)   Collection Time: 01/23/20  7:04 PM   Specimen: BLOOD LEFT HAND  Result Value Ref Range Status   Specimen Description BLOOD LEFT HAND  Final   Special Requests   Final    BOTTLES DRAWN AEROBIC AND ANAEROBIC Blood Culture adequate volume   Culture  Setup Time   Final    GRAM POSITIVE COCCI IN CHAINS IN  BOTH AEROBIC AND ANAEROBIC BOTTLES Organism ID to follow CRITICAL RESULT CALLED TO, READ BACK BY AND VERIFIED WITH: Chalmers Cater RN 9:55 01/24/20 ([email or web address])    Culture   Final    NO GROWTH < 12 HOURS Performed at [facility], [address], [place], [place] [address]    Report Status PENDING  Incomplete  Blood Culture ID Panel (Reflexed)     Status: Abnormal   Collection Time: 01/23/20  7:04 PM  Result Value Ref Range Status   Enterococcus faecalis DETECTED (A) NOT DETECTED Final    Comment: CRITICAL RESULT CALLED TO, READ BACK BY AND VERIFIED WITH: Chalmers Cater RN 9:55 01/24/20 ([email or web address])    Enterococcus Faecium NOT DETECTED NOT DETECTED Final   Listeria monocytogenes NOT DETECTED NOT DETECTED Final   Staphylococcus species DETECTED (A) NOT DETECTED Final    Comment: CRITICAL RESULT CALLED TO, READ BACK BY AND VERIFIED WITH: Chalmers Cater RN 9:55 01/24/20 ([email or web address])    Staphylococcus aureus (BCID) NOT DETECTED NOT DETECTED Final   Staphylococcus epidermidis NOT  DETECTED NOT DETECTED Final   Staphylococcus lugdunensis NOT DETECTED NOT DETECTED Final   Streptococcus species NOT DETECTED NOT DETECTED Final   Streptococcus agalactiae NOT DETECTED NOT DETECTED Final   Streptococcus pneumoniae NOT DETECTED NOT DETECTED Final   Streptococcus pyogenes NOT DETECTED NOT DETECTED Final   A.calcoaceticus-baumannii NOT DETECTED NOT DETECTED Final   Bacteroides fragilis NOT DETECTED NOT DETECTED Final   Enterobacterales NOT DETECTED NOT DETECTED Final   Enterobacter cloacae complex NOT DETECTED NOT DETECTED Final   Escherichia coli NOT DETECTED NOT DETECTED Final   Klebsiella aerogenes NOT DETECTED NOT DETECTED Final   Klebsiella oxytoca NOT DETECTED NOT DETECTED Final   Klebsiella pneumoniae NOT DETECTED NOT DETECTED Final   Proteus species NOT DETECTED NOT DETECTED Final   Salmonella species NOT DETECTED NOT DETECTED Final   Serratia marcescens NOT DETECTED NOT DETECTED Final   Haemophilus influenzae  NOT DETECTED NOT DETECTED Final   Neisseria meningitidis NOT DETECTED NOT DETECTED Final   Pseudomonas aeruginosa NOT DETECTED NOT DETECTED Final   Stenotrophomonas maltophilia NOT DETECTED NOT DETECTED Final   Candida albicans NOT DETECTED NOT DETECTED Final   Candida auris NOT DETECTED NOT DETECTED Final   Candida glabrata NOT DETECTED NOT DETECTED Final   Candida krusei NOT DETECTED NOT DETECTED Final   Candida parapsilosis NOT DETECTED NOT DETECTED Final   Candida tropicalis NOT DETECTED NOT DETECTED Final   Cryptococcus neoformans/gattii NOT DETECTED NOT DETECTED Final   Vancomycin resistance DETECTED (A) NOT DETECTED Final    Comment: CRITICAL RESULT CALLED TO, READ BACK BY AND VERIFIED WITH: Chalmers CaterK. Cadet RN 9:55 01/24/20 ([email or web address]) Performed at [facility], [address] [place], [place] [address]   Culture, blood (routine x 2)     Status: None (Preliminary result)   Collection Time: 01/23/20  7:14 PM   Specimen: BLOOD RIGHT HAND  Result Value Ref Range Status   Specimen Description BLOOD RIGHT HAND  Final   Special Requests   Final    BOTTLES DRAWN AEROBIC ONLY Blood Culture results may not be optimal due to an inadequate volume of blood received in culture bottles   Culture  Setup Time   Final    GRAM POSITIVE COCCI IN CHAINS AEROBIC BOTTLE ONLY CRITICAL VALUE NOTED.  VALUE IS CONSISTENT WITH PREVIOUSLY REPORTED AND CALLED VALUE. Performed at [facility], [address] [place], [place] [address]    Culture GRAM POSITIVE COCCI  Final   Report Status PENDING  Incomplete    Coagulation Studies: No results for input(s): LABPROT, INR in the last 72 hours.  Urinalysis: No results for input(s): COLORURINE, LABSPEC, PHURINE, GLUCOSEU, HGBUR, BILIRUBINUR, KETONESUR, PROTEINUR, UROBILINOGEN, NITRITE, LEUKOCYTESUR in the last 72 hours.  Invalid input(s): APPERANCEUR    Imaging: DG CHEST PORT 1 VIEW  Result Date: 01/23/2020 CLINICAL DATA:  Pneumonia. EXAM: PORTABLE  CHEST 1 VIEW COMPARISON:  01/16/2020 FINDINGS: Stable mildly enlarged cardiac silhouette. Stable tracheostomy tube. Right jugular catheter tip in the upper right atrium near the superior cavoatrial junction. Chronic interstitial prominence throughout both lungs is again demonstrated with interval mild superimposed airspace opacity in the right mid lung zone both lung bases, greater on the left. Probable small right pleural effusion. Unremarkable bones. IMPRESSION: 1. Interval mild bilateral pneumonia, greater on the left. 2. Probable small right pleural effusion. 3. Stable mild cardiomegaly and chronic interstitial lung disease. Electronically Signed   By: Beckie SaltsSteven  Reid M.D.   On: 01/23/2020 16:56     Medications:  Assessment/ Plan:  54 y.o. male a PMHx of recent COVID-19 infection with resultant respiratory failure status post tracheostomy placement, hypertension, hyperlipidemia, tobacco abuse, ARDS, PEG tube placement who was admitted to [facility] on 12-06-2019 for ongoing treatment of acute respiratory failure.  1.  Acute kidney injury suspect most likely underlying ATN.  Patient with very significant azotemia at the initiation of dialysis treatments. -Patient due for dialysis treatment today.  We will continue to use albumin for blood pressure support for now.  2.  Hyperkalemia.  Resolved.  3.  Acute respiratory failure.  Patient still requiring significant ventilatory support.  FiO2 currently 70% with a PEEP of 9.  Continue ultrafiltration to assist with oxygenation.  4.  Hyponatremia.  Sodium improved to 133.  Continue to monitor.  4.  Anemia of chronic kidney disease.  Hemoglobin improved from 6.9 up to 7.3.  Platelets still low at 19,000.  Continue to pack catheter with citrate solution.   LOS: 0 Shanea Karney 9/8/20217:52 AM

## 2020-02-17 NOTE — Progress Notes (Addendum)
Pulmonary Critical Care Medicine [facility]   PULMONARY CRITICAL CARE SERVICE  PROGRESS NOTE  Date of Service: 01/19/2020  Danny Leach  WEX:[id number]  DOB: October 27, 1965   DOA: December 14, 2019  Referring Physician: Carron Curie, MD  HPI: Danny Leach is a 54 y.o. male seen for follow up of Acute on Chronic Respiratory Failure. Patient is transitioning to  Comfort care today.  Remains on full support on vent at this time.   Medications: Reviewed on Rounds  Physical Exam:  Vitals: Pulse 96, resp 37, bp 100/53, sat 99%, temp 97.1  Ventilator Settings Vent mode ACPC rate 35, IP 32, peep 9, fio2 70%.   . General: Comfortable at this time . Eyes: Grossly normal lids, irises & conjunctiva . ENT: grossly tongue is normal . Neck: no obvious mass . Cardiovascular: S1 S2 normal no gallop . Respiratory: course breath sounds . Abdomen: soft . Skin: no rash seen on limited exam . Musculoskeletal: not rigid . Psychiatric:unable to assess . Neurologic: no seizure no involuntary movements         Lab Data:   Basic Metabolic Panel: Recent Labs  Lab 01/19/20 0520 01/19/20 0520 01/20/20 0500 01/20/20 0500 01/20/20 2200 01/22/20 0814 01/23/20 0633 01/24/20 0757 01/22/2020 0901  NA 134*   < > 129*   < > 134* 129* 129* 133* 133*  K 4.3   < > 3.8   < > 3.8 4.4 4.5 3.6 4.1  CL 97*   < > 93*   < > 97* 92* 93* 95* 96*  CO2 27   < > 27   < > 25 26 25 26 28   GLUCOSE 141*   < > 124*   < > 140* 153* 125* 170* 156*  BUN 83*   < > 125*   < > 74* 128* 152* 94* 85*  CREATININE 1.54*   < > 1.91*   < > 1.31* 1.94* 2.14* 1.59* 1.38*  CALCIUM 8.4*   < > 8.3*   < > 7.9* 8.5* 8.7* 8.2* 8.3*  MG 2.1  --  2.1  --  1.9  --   --   --   --   PHOS  --   --  2.4*  --   --   --  3.5 2.7 3.1   < > = values in this interval not displayed.    ABG: Recent Labs  Lab 01/19/20 1133 01/20/20 1023 01/21/20 0916  PHART 7.156* 7.194* 7.316*  PCO2ART 82.8* 71.8* 57.1*  PO2ART 176* 138*  64.2*  HCO3 28.2* 26.8 28.1*  O2SAT 99.7 99.1 94.9    Liver Function Tests: Recent Labs  Lab 01/20/20 0500 01/23/20 0633 01/24/20 0757 01/26/2020 0901  ALBUMIN 1.7* 1.6* 1.4* 1.7*   No results for input(s): LIPASE, AMYLASE in the last 168 hours. No results for input(s): AMMONIA in the last 168 hours.  CBC: Recent Labs  Lab 01/23/20 1030 01/24/20 0757 01/24/20 1811 01/20/2020 0044 02/13/2020 0901  WBC 5.4 4.6 4.7 6.1 4.0  HGB 6.5* 6.5* 6.9* 7.3* 6.5*  HCT 21.4* 21.5* 23.1* 24.4* 21.9*  MCV 100.5* 100.5* 99.1 100.0 99.5  PLT 19* 21* 21* 19* 15*    Cardiac Enzymes: Recent Labs  Lab 01/24/20 1811  CKTOTAL 34*    BNP (last 3 results) No results for input(s): BNP in the last 8760 hours.  ProBNP (last 3 results) No results for input(s): PROBNP in the last 8760 hours.  Radiological Exams: DG CHEST PORT 1 VIEW  Result Date: 01/23/2020 CLINICAL DATA:  Pneumonia. EXAM: PORTABLE CHEST 1 VIEW COMPARISON:  01/16/2020 FINDINGS: Stable mildly enlarged cardiac silhouette. Stable tracheostomy tube. Right jugular catheter tip in the upper right atrium near the superior cavoatrial junction. Chronic interstitial prominence throughout both lungs is again demonstrated with interval mild superimposed airspace opacity in the right mid lung zone both lung bases, greater on the left. Probable small right pleural effusion. Unremarkable bones. IMPRESSION: 1. Interval mild bilateral pneumonia, greater on the left. 2. Probable small right pleural effusion. 3. Stable mild cardiomegaly and chronic interstitial lung disease. Electronically Signed   By: Beckie Salts M.D.   On: 01/23/2020 16:56    Assessment/Plan Active Problems:   Acute on chronic respiratory failure with hypoxia (HCC)   COVID-19 virus infection   Acute respiratory distress syndrome (ARDS) due to COVID-19 virus (HCC)   Pneumonia due to COVID-19 virus   Acute renal failure due to tubular necrosis (HCC)   1. Acute on chronic  respiratory failure hypoxia we will continue with full support on pressure control.  continue to attempt weaning as tolerated.  Continue supportive measures and pulm toilet.  2. COVID-19 virus infection at baseline we will continue to follow 3. ARDS slow improvement 4. Pneumonia due to COVID-19 still residual damage changes noted on x-rays 5. Acute renal failure being followed by nephrology   I have personally seen and evaluated the patient, evaluated laboratory and imaging results, formulated the assessment and plan and placed orders. The Patient requires high complexity decision making with multiple systems involvement.  Rounds were done with the Respiratory Therapy Director and Staff therapists and discussed with nursing staff also.  Yevonne Pax, MD [facility] Pulmonary Critical Care Medicine Sleep Medicine

## 2020-02-17 DEATH — deceased

## 2020-12-20 IMAGING — XA IR FLUORO GUIDE CV LINE*R*
1 series · 1 of 1 positions shown · non-contrast
Comparison: none

CLINICAL DATA: Acute kidney injury and need for non tunneled
hemodialysis catheter to begin dialysis.

[Series 1: fl angio · 1 of 1 slices shown]
[im 1/1]
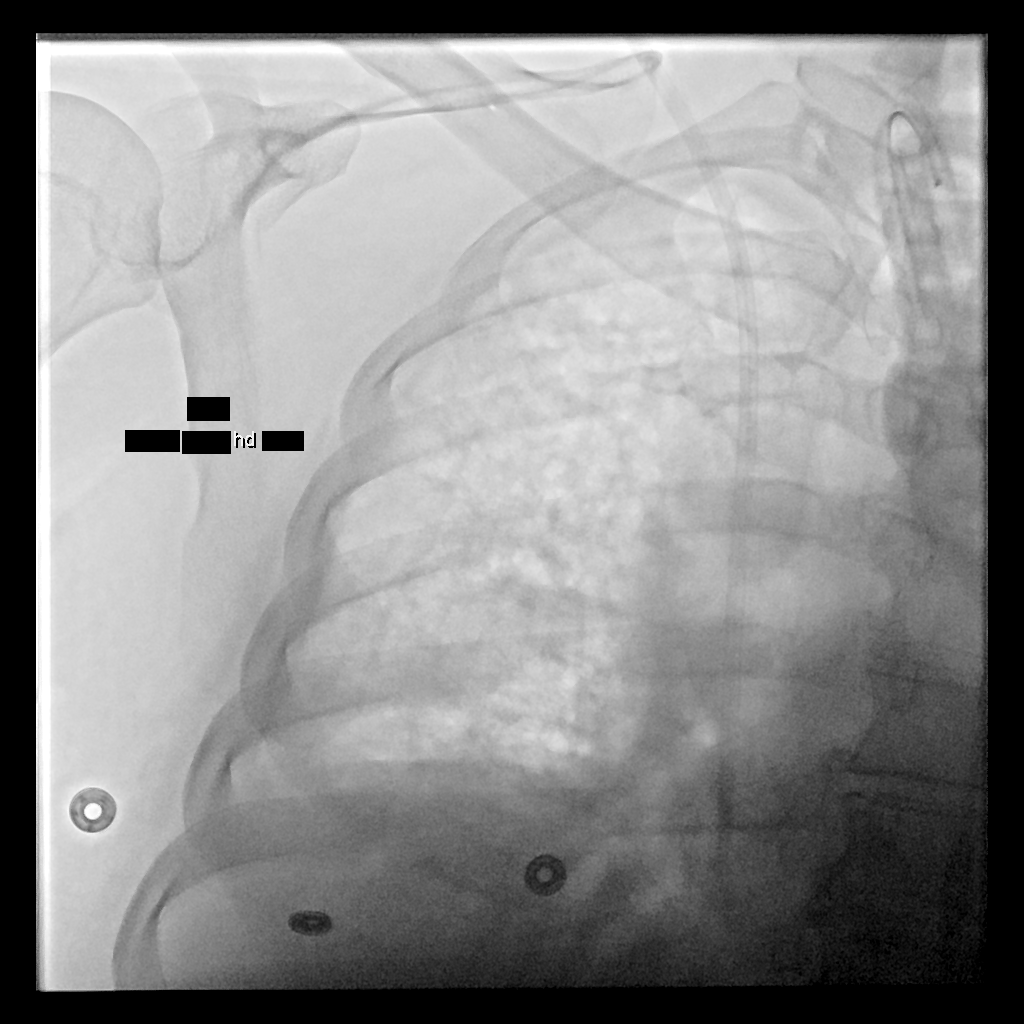

[1 of 1 positions shown; findings below may reference images not displayed]

EXAM:
NON-TUNNELED CENTRAL VENOUS HEMODIALYSIS CATHETER PLACEMENT WITH
ULTRASOUND AND FLUOROSCOPIC GUIDANCE

FLUOROSCOPY TIME:  26 seconds.  5.0 mGy.

PROCEDURE:
The procedure, risks, benefits, and alternatives were explained to
the patient's wife. Questions regarding the procedure were
encouraged and answered. The patient's wife understands and consents
to the procedure. A time-out was performed prior to initiating the
procedure.

The right neck and chest were prepped with chlorhexidine in a
sterile fashion, and a sterile drape was applied covering the
operative field. Maximum barrier sterile technique with sterile
gowns and gloves were used for the procedure. Local anesthesia was
provided with 1% lidocaine.

Ultrasound was used to confirm patency of the right internal jugular
vein. After creating a small venotomy incision, a 21 gauge needle
was advanced into the right internal jugular vein under direct,
real-time ultrasound guidance. Ultrasound image documentation was
performed. Venous access was dilated over a guidewire. A 20 cm,
triple lumen, 13 French Mahurkar catheter was placed over the wire.

Final catheter positioning was confirmed and documented with a
fluoroscopic spot image. The catheter was aspirated, flushed with
saline, and injected with appropriate volume heparin dwells. The
catheter exit site was secured with 0-Prolene retention sutures.

COMPLICATIONS:
None.  No pneumothorax.
FINDINGS: After catheter placement, the tip lies at the cavoatrial junction.
The catheter aspirates normally and is ready for immediate use.
IMPRESSION: Placement of non-tunneled central venous hemodialysis catheter via
the right internal jugular vein. The catheter tip lies at the
cavoatrial junction. The catheter is ready for immediate use.

## 2021-01-06 IMAGING — DX DG CHEST 1V PORT
1 series · 1 of 1 positions shown · non-contrast
Comparison: Seven days ago

CLINICAL DATA: Respiratory failure

EXAM:
PORTABLE CHEST 1 VIEW

[chest ap]
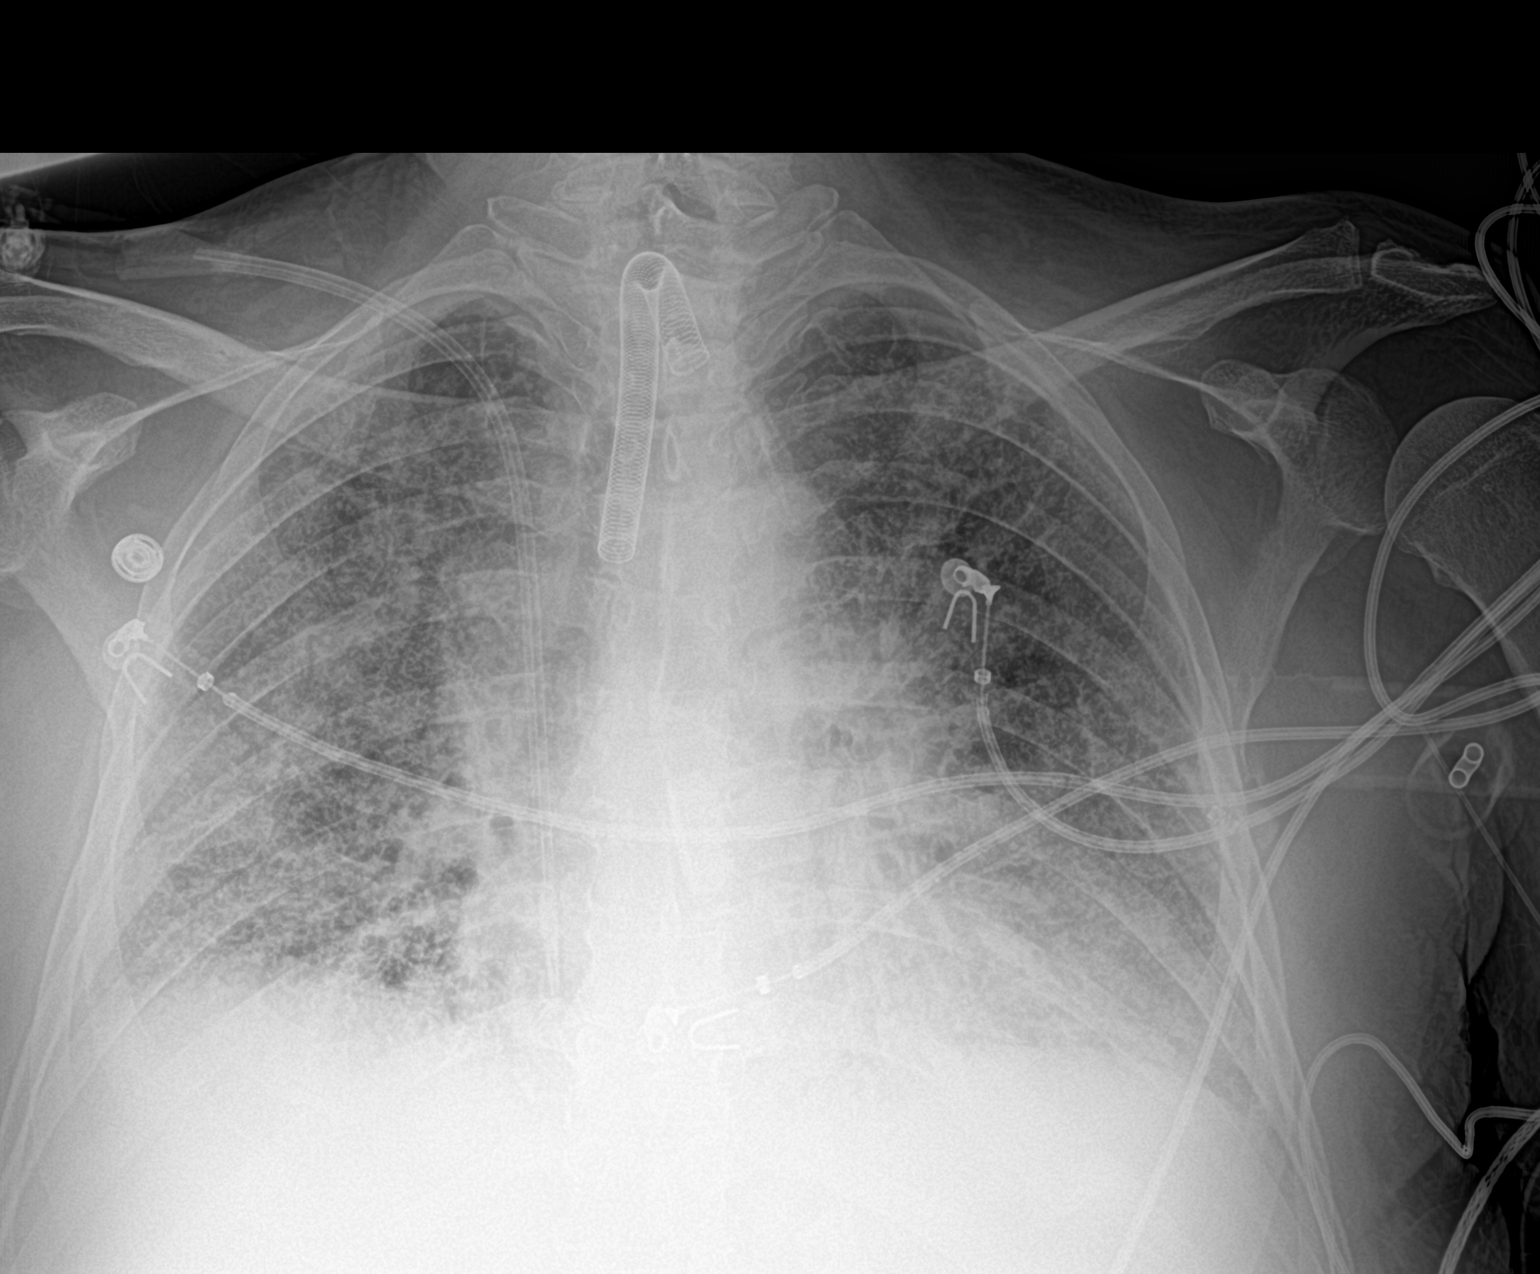

[1 of 1 positions shown; findings below may reference images not displayed]

FINDINGS: Diffuse coarse interstitial opacity on both sides. No effusion or
pneumothorax. Central line on the right with tip at the right
atrium. Tracheostomy tube in place. Stable enlarged heart size.
IMPRESSION: Coarse pulmonary opacity with fibrotic features by prior CT. No
change from prior.

## 2021-01-13 IMAGING — DX DG CHEST 1V PORT
1 series · 1 of 1 positions shown · non-contrast
Comparison: 01/16/2020

CLINICAL DATA: Pneumonia.

EXAM:
PORTABLE CHEST 1 VIEW

[chest]
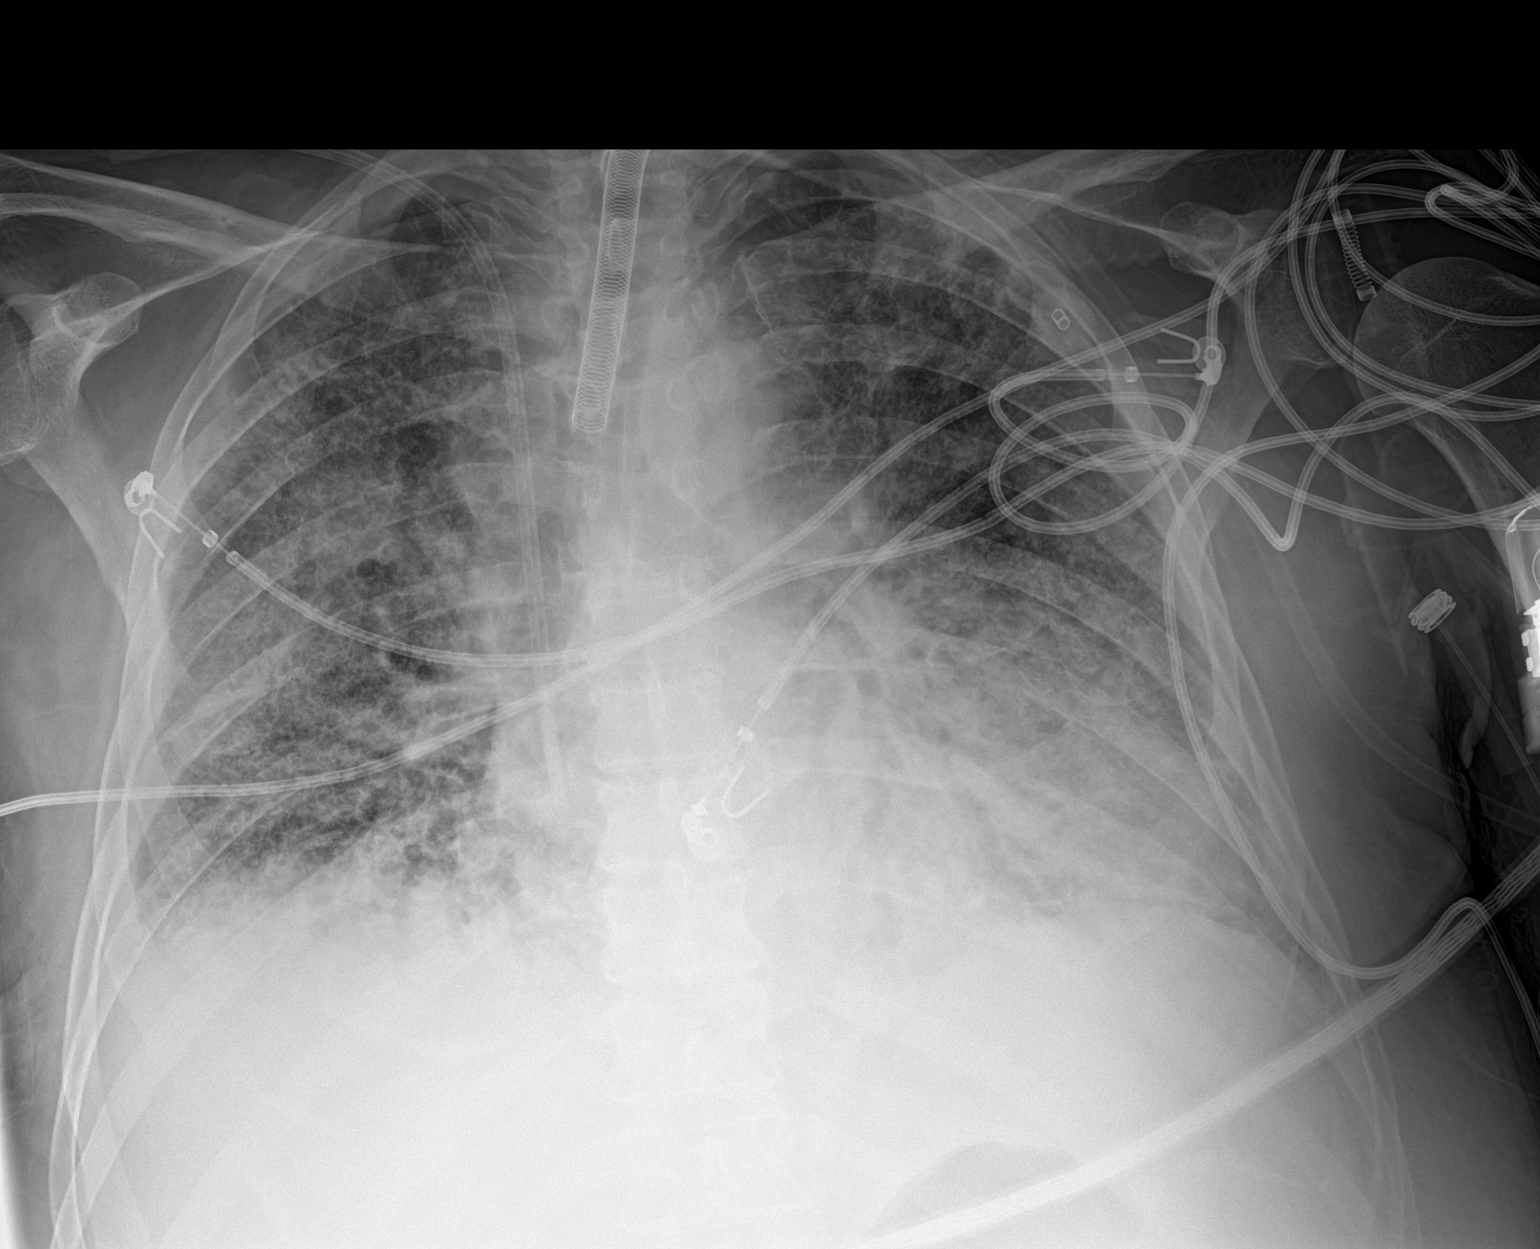

[1 of 1 positions shown; findings below may reference images not displayed]

FINDINGS: Stable mildly enlarged cardiac silhouette. Stable tracheostomy tube.
Right jugular catheter tip in the upper right atrium near the
superior cavoatrial junction. Chronic interstitial prominence
throughout both lungs is again demonstrated with interval mild
superimposed airspace opacity in the right mid lung zone both lung
bases, greater on the left. Probable small right pleural effusion.
Unremarkable bones.
IMPRESSION: 1. Interval mild bilateral pneumonia, greater on the left.
2. Probable small right pleural effusion.
3. Stable mild cardiomegaly and chronic interstitial lung disease.
